# Patient Record
Sex: Female | Born: 1950 | Race: White | Hispanic: No | Marital: Single | Smoking: Former smoker
Health system: Southern US, Community
[De-identification: ages and names within clinical notes are randomized; demographics above are authoritative.]

## PROBLEM LIST (undated history)

## (undated) DIAGNOSIS — G5603 Carpal tunnel syndrome, bilateral upper limbs: Secondary | ICD-10-CM

## (undated) DIAGNOSIS — I10 Essential (primary) hypertension: Secondary | ICD-10-CM

## (undated) DIAGNOSIS — T8859XA Other complications of anesthesia, initial encounter: Secondary | ICD-10-CM

## (undated) DIAGNOSIS — F419 Anxiety disorder, unspecified: Secondary | ICD-10-CM

## (undated) DIAGNOSIS — Z87442 Personal history of urinary calculi: Secondary | ICD-10-CM

## (undated) DIAGNOSIS — N289 Disorder of kidney and ureter, unspecified: Secondary | ICD-10-CM

## (undated) DIAGNOSIS — Z8719 Personal history of other diseases of the digestive system: Secondary | ICD-10-CM

## (undated) DIAGNOSIS — J449 Chronic obstructive pulmonary disease, unspecified: Secondary | ICD-10-CM

## (undated) DIAGNOSIS — H269 Unspecified cataract: Secondary | ICD-10-CM

## (undated) DIAGNOSIS — T4145XA Adverse effect of unspecified anesthetic, initial encounter: Secondary | ICD-10-CM

## (undated) DIAGNOSIS — M1990 Unspecified osteoarthritis, unspecified site: Secondary | ICD-10-CM

## (undated) DIAGNOSIS — E119 Type 2 diabetes mellitus without complications: Secondary | ICD-10-CM

## (undated) DIAGNOSIS — D649 Anemia, unspecified: Secondary | ICD-10-CM

## (undated) DIAGNOSIS — H409 Unspecified glaucoma: Secondary | ICD-10-CM

## (undated) DIAGNOSIS — IMO0001 Reserved for inherently not codable concepts without codable children: Secondary | ICD-10-CM

## (undated) DIAGNOSIS — Z8489 Family history of other specified conditions: Secondary | ICD-10-CM

## (undated) HISTORY — PX: ABDOMINAL HYSTERECTOMY: SHX81

## (undated) HISTORY — PX: JOINT REPLACEMENT: SHX530

## (undated) HISTORY — PX: TOTAL KNEE ARTHROPLASTY: SHX125

## (undated) HISTORY — PX: OTHER SURGICAL HISTORY: SHX169

## (undated) HISTORY — PX: TOTAL HIP ARTHROPLASTY: SHX124

## (undated) HISTORY — PX: COLONOSCOPY: SHX174

## (undated) HISTORY — PX: CARPAL TUNNEL RELEASE: SHX101

---

## 1999-06-04 ENCOUNTER — Inpatient Hospital Stay (HOSPITAL_COMMUNITY): Admission: RE | Admit: 1999-06-04 | Discharge: 1999-06-07 | Payer: Self-pay | Admitting: Orthopaedic Surgery

## 1999-06-04 ENCOUNTER — Encounter (INDEPENDENT_AMBULATORY_CARE_PROVIDER_SITE_OTHER): Payer: Self-pay | Admitting: Specialist

## 2000-03-03 ENCOUNTER — Encounter: Admission: RE | Admit: 2000-03-03 | Discharge: 2000-03-03 | Payer: Self-pay | Admitting: Obstetrics and Gynecology

## 2000-03-03 ENCOUNTER — Encounter: Payer: Self-pay | Admitting: Obstetrics and Gynecology

## 2003-11-29 ENCOUNTER — Ambulatory Visit (HOSPITAL_COMMUNITY): Admission: RE | Admit: 2003-11-29 | Discharge: 2003-11-29 | Payer: Self-pay | Admitting: Family Medicine

## 2004-01-03 ENCOUNTER — Ambulatory Visit (HOSPITAL_COMMUNITY): Admission: RE | Admit: 2004-01-03 | Discharge: 2004-01-03 | Payer: Self-pay | Admitting: General Surgery

## 2004-12-28 ENCOUNTER — Inpatient Hospital Stay (HOSPITAL_COMMUNITY): Admission: RE | Admit: 2004-12-28 | Discharge: 2004-12-31 | Payer: Self-pay | Admitting: Orthopaedic Surgery

## 2007-05-25 ENCOUNTER — Ambulatory Visit (HOSPITAL_COMMUNITY): Admission: RE | Admit: 2007-05-25 | Discharge: 2007-05-25 | Payer: Self-pay | Admitting: Family Medicine

## 2010-06-04 ENCOUNTER — Ambulatory Visit (HOSPITAL_COMMUNITY)
Admission: RE | Admit: 2010-06-04 | Discharge: 2010-06-04 | Payer: Self-pay | Source: Home / Self Care | Attending: Family Medicine | Admitting: Family Medicine

## 2010-09-28 NOTE — Op Note (Signed)
Gwendolyn Snyder, Gwendolyn Snyder                ACCOUNT NO.:  [id number]   MEDICAL RECORD NO.:  [id number]          PATIENT TYPE:  INP   LOCATION:  2859                         FACILITY:  [facility]   PHYSICIAN:  Mark C. Ophelia Charter, M.D.    DATE OF BIRTH:  1950/11/24   DATE OF PROCEDURE:  12/28/2004  DATE OF DISCHARGE:                                 OPERATIVE REPORT   PREOPERATIVE DIAGNOSIS:  Right knee osteoarthritis.   POSTOPERATIVE DIAGNOSIS:  Right knee osteoarthritis.   PROCEDURE:  Right total knee arthroplasty.   SURGEON:  Mark C. Ophelia Charter, M.D.   ANESTHESIA:  Preoperative femoral nerve block.   TOURNIQUET TIME:  One hour 24 minutes.   ESTIMATED BLOOD LOSS:  Minimal.   COMPONENTS USED:  Summit DePuy rotating platform 2.5 cemented femur, #3  cemented tibia with 10 mm rotating platform 2.5 size, 38 mm All-Poly dome  three-peg patella.   DESCRIPTION OF PROCEDURE:  After induction of general anesthesia,  preoperative Ancef prophylaxis, standard prepping and draping, Betadine and  Vi-Drape application after sterile skin marker with the impervious  stockinette and Coban.  Incision was made midline, superficial retinaculum  was developed.  Median parapatellar incision was made splitting the quad  tendon between the medial one third and lateral two thirds.  Patella was  flipped over and cut from facet to facet removing 9.5 mm of bone, sizing for  38 and drilling three holes.  Intermedullary drill was run up the femur with  the rod, removing 14 mm of bone on the femur, intermedullary rod placed down  the tibia and AP cut was made on the tibia, 0 degree slope using the guide  removing about 10 mm of bone.  The 10 mm spacing block was inserted.  It was  slightly tight and it was selected, removing additional 2 mm and this gave  excellent fit with the spacer block inserted with good collateral ligament  balance.  Anterior and posterior cuts were made on the femur based on sizing  which was a 2.5 Chamfer  cuts, box cuts.  Sizing for the tibia was a 3.  Keel  hole was made.  Pulsatile lavage.  All trials were inserted.  There was  excellent fit with a 10 mm spacer.  After vacuum mixing, meticulous drying,  pulse lavage, tibia was cemented in first.  All excessive cement was removed  with Therapist, nutritional.  Femur was then cemented in.  Minimal amount of cement  in the box.  Posterior spurs had been removed prior to mixing of the cement  and all menisci had been resected.  A 10 mm insert was inserted followed by  patella held with the clamp.  When cement was hard, tourniquet was deflated,  meticulous hemostasis.  Tourniquet time was one hour and 24 minutes.  True  retinaculum closed with #1 Ti-Cron figure-of-eight closure, 2-0 Vicryl in  superficial  retinaculum, subcutaneous tissue, skin staple closure.  Xeroform, 4x4s,  Webril and Ace wrap, knee immobilizer.  Patient tolerated the procedure well  and was transferred to the recovery room in stable condition.  Instrument  count and needle count was correct.      Mark C. Ophelia Charter, M.D.  Electronically Signed     MCY/MEDQ  D:  12/28/2004  T:  12/28/2004  Job:  [id number]

## 2010-09-28 NOTE — Discharge Summary (Signed)
[name], Gwendolyn Snyder                ACCOUNT NO.:  [id number]   MEDICAL RECORD NO.:  [id number]          PATIENT TYPE:  INP   LOCATION:  5037                         FACILITY:  [facility]   PHYSICIAN:  Mark C. Ophelia Charter, M.D.    DATE OF BIRTH:  1950-09-23   DATE OF ADMISSION:  12/28/2004  DATE OF DISCHARGE:  12/31/2004                                 DISCHARGE SUMMARY   FINAL DIAGNOSIS:  Right knee osteoarthritis.   ADDITIONAL DIAGNOSES:  1.  Hypertension.  2.  Status post bilateral total hip arthroplasties.   PROCEDURE:  Right cemented total knee arthroplasty on December 28, 2004.   This 60 year old female has right knee osteoarthritis, bone on bone changes,  previous total hip arthroplasties past history, doing well.  Right knee has  only 10 to 90 degrees range of motion, stable varus valgus with bone on bone  changes to post effusion and failure to respond to anti-inflammatories,  repeat injections, visco-supplements as well as cortisone.   HOSPITAL COURSE:  Patient was admitted and routine labs showed normal  urinalysis.  Potassium of 3.3 on admission, glucose 102.  PT and PTT were  normal.  Hemoglobin was 14.6.  The patient underwent total knee arthroplasty  under general anesthesia with perioperative antibiotic prophylaxis,  postoperative Coumadin and DVT prophylaxis, PT, OT, total knee arthroplasty  protocol.  She ambulated 30 feet postoperative day #1.  On postoperative day  #2, IV PCA was stopped.  She made good progress and was discharged on December 31, 2004, using a CPM past 50 degrees.  Coumadin 4 mg a day, potassium  chloride 30 mg a day.  Home health PT and home health RN ordered.   CONDITION ON DISCHARGE:  Satisfactory.   FOLLOW UP:  Office follow-up in one week post discharge.      Mark C. Ophelia Charter, M.D.  Electronically Signed     MCY/MEDQ  D:  03/11/2005  T:  03/11/2005  Job:  [id number]

## 2010-09-28 NOTE — Op Note (Signed)
[place]  Patient:    Gwendolyn Snyder                          MRN: [id number] Proc. Date: 06/04/99 Adm. Date:  [id number] Attending:  Jacki Cones                           Operative Report  PREOPERATIVE DIAGNOSIS:  Right hip osteoarthritis.  POSTOPERATIVE DIAGNOSIS:  Right hip osteoarthritis.  PROCEDURE:  Right total hip arthroplasty.  SURGEON:  Mark C. Ophelia Charter, M.D.  ASSISTANT:  Olga Coaster. Chestine Spore, P.A.  ANESTHESIA:  GOT.  ESTIMATED BLOOD LOSS:  200 cc.  COMPONENTS USED:  Osteonics #8 femur, +5 neck, 50 mm acetabulum with a 10 mm liner.  DESCRIPTION OF PROCEDURE:  After the induction of general anesthesia, the patient was placed in the lateral position with an axillary roll.  The hip was prepped ith DuraPrep, and the usual preoperative Ancef was given prophylactically and _____  split sheets, impervious stockinette, Coban, and Betadine Vi-drape x 2.  The posterior approach was used identical to the opposite hip incision.  The gluteus maximus was split in line with its fibers.  There was a large nodule adjacent to the posterior hip capsule which looked like a rheumatoid nodule.  This was excised and removed and sent for pathologic examination.  There was capsular thickening. The piriformis was tagged for later repair, and the posterior capsule was excised. The capsule was hypertrophic.  There was only a small amount of joint fluid which was clear.  There were marginal osteophytes.  A large Steinmann pin was placed n the pelvis and a drill bit in the lateral aspect of the greater trochanter. The distance between the two measured at 6.0 cm, and then the hip was internally rotated.  The hip was dislocated.  The neck was cut.  The femur was prepared with proximal reaming, sequential broaching, and then distal reaming to 12.5.  The acetabulum was then prepared.  Sequential reaming from 44 up to 50.  The 50 gave good rim fit.   There were some large rim osteophytes that had to be removed. The cup was placed securely and impacted in place in 25-30 degrees of cup flexion, nd approximately 45 degrees of abduction.  There was slight overhang of the metal prosthesis over the posterior rim in the back 2.0 mm, and slight coverage on the anterior aspect, and it was flushed laterally.  _______ to the corner of the room. It was then aligned with the preoperative measurement from the ASIS to the sciatic notch and a lateral pelvis x-ray that was present hanging in the room.  Trials were placed with the broaches.  The patient was short on this side and needed to be lengthened 1/2 to 3/4 inch, and a +5 did this nicely.  A +10 lengthened it 2.0 cm from 6.0 to 8.0 cm, measuring from the styloid pin to the drill bit and was extremely tight.  There was no in between size between the #5 and the #10, so the +5 was selected.  After irrigation with saline solution the central hole plug in the acetabulum, permanent polyethylene liner, permanent femoral prosthesis was inserted and a +5 ball impacted.  The hip was reduced.  It had excellent stability, flexion to 90 degrees, adduction to 30 degrees, and internal rotation all the way to 90 degrees, with no evidence of instability.  There was no shuck with distal distraction.  The hip could reach full extension.  No trochanteric impingement.  The tensor fascia was closed with #2 Tycron, #0 Vicryl in the gluteus maximus fascia, #2-0 Vicryl in the subcutaneous tissue, and skin staple closure. Postoperatively a knee immobilizer was applied.  The patient was transferred to the recovery room in stable condition.  The instrument count and needle count were correct. DD:  06/04/99 TD:  06/05/99 Job: [id number]

## 2010-09-28 NOTE — Discharge Summary (Signed)
[place]  Patient:    Gwendolyn Snyder, Gwendolyn Snyder                         MRN: [id number] Adm. Date:  06/04/99 Disc. Date: 06/07/99 Attending:  Loraine Leriche C. Ophelia Charter, M.D. Dictator:   Bernita Raisin, P.A.                           Discharge Summary  ADMISSION DIAGNOSES: 1. Hypertension. 2. Panic disorder. 3. Osteoarthritis. 4. Kidney stones. 5. Obesity.  DISCHARGE DIAGNOSES: 1. Hypertension. 2. Panic disorder. 3. Osteoarthritis. 4. Kidney stones. 5. Obesity.  SERVICE:  [employer].  REFERRING PHYSICIANS:  None.  CONSULTATIONS:  None.  PROCEDURE:  On June 04, 1999, the patient had insertion of a right total hip  arthroplasty.  PERTINENT HISTORY AND PHYSICAL EXAMINATION:  The patients chief complaint is right hip pain; had had symptoms for two years, progressively worsening the last six months reducing ADLs and causing the patient much discomfort at night.  She has  failed conservative treatment.  PAST SURGICAL HISTORY:  Include a cyst removal, 1969 and 1971.  Kidney stone extraction in 1984, hysterectomy in 1993 and a left total hip arthroplasty in 1994.  ALLERGIES:  CODEINE.  SOCIAL HISTORY:  The patient smokes one-pack of cigarettes a day.  Physical examination:  Examination of the right hip and lower extremities, she as 1+ pulses in her feet, passive range of motion reproduces her pain symptoms in er hip.  She has negative straight leg raise.  Range of motion is from 5 degrees internal rotation, 35 degrees external rotation with extreme pain at the extremes. The remainder of her physical examination is within normal limits.  X-rays of the right hip show decreased joint space with marginal osteophytes and flattening of the femoral head.  ADMISSION MEDICATIONS: 1. Norvasc 5 mg daily. 2. Xanax 0.25 mg daily. 3. HCTZ 25 mg one-half daily. 4. Zoloft 100 mg daily. 5. Potassium, B12, and multivitamins, one p.o.  daily.  HOSPITAL COURSE:  On June 04, 1999, the patient had uncomplicated insertion f a right total hip arthroplasty by Dr. Annell Greening, assistant Inocente Salles, P.A. EBL 200 cc and the patient received Ancef 1 g IV intraoperatively.  No drains were inserted.  Postop day #1, the patient was doing well.  Sciatic nerve was intact.  The pain is being well-controlled.  O2 was discontinued and PT was started.  PT and OT saw he patient on this date and performed their evaluation.  On postop day #2 the patient continued to do well and no complaints.  No nausea, vomiting, or diarrhea.  She reports that therapy is going well.  Afebrile. Vital signs are stable with the incisions clean, dry and intact.  The patient is neurovascularly intact.  Labs:  PT 17.1, INR of 1.7.  White blood cell count 10.2, platelets 232, hemoglobin 11.2, hematocrit 33.  Postop day #3, the patient continued to do well.  States she is ready to go home. She has no complaints.  She has had bowel movement.  She has had no nausea, vomiting, or diarrhea.  She reports she has been ambulating with the use of a walker and performing steps with ease.  She is afebrile.  Vital signs are stable. Again, the incision looks clean, dry and intact.  No erythema or drainage.  She is neurovascularly intact.  Sciatic nerve intact.  Labs:  PT 17.5 and  INR of 1.8. CBC is normal.  DISPOSITION:  The patient was discharged home.  DISCHARGE MEDICATIONS:  Resume home meds with the addition of Tylox for pain.  DISCHARGE INSTRUCTIONS:  The patient was setup for home health PT.  No home Coumadin therapy.  She was instructed to use her shower chair, but not to shower until one week postop.  She was instructed to follow up in the office in one week, to change her dressing every two to three days or p.r.n. and we will see her in the office in one week.  FINAL DIAGNOSES: 1. Hypertension. 2. Osteoarthritis. 3. Kidney stones. 4. Panic  disorder. 5. Obesity. DD:  08/03/99 TD:  08/03/99 Job: [id number]

## 2010-09-28 NOTE — H&P (Signed)
NAME:  Gwendolyn Snyder, Gwendolyn Snyder                          ACCOUNT NO.:  [id number]   MEDICAL RECORD NO.:  [id number]                   PATIENT TYPE:   LOCATION:                                       FACILITY:   PHYSICIAN:  Dalia Heading, M.D.               DATE OF BIRTH:  April 03, 1951   DATE OF ADMISSION:  DATE OF DISCHARGE:                                HISTORY & PHYSICAL   CHIEF COMPLAINT:  Need for screening colonoscopy.   HISTORY OF PRESENT ILLNESS:  The patient is a 60 year old white female who  was referred for endoscopic evaluation. She needs colonoscopy for screening  purposes. No abdominal pain, weight loss, nausea, vomiting, diarrhea,  constipation, melena, or hematochezia has been noted. She has never had a  colonoscopy. There is no family history of colon carcinoma.   PAST MEDICAL HISTORY:  Includes hypertension.   PAST SURGICAL HISTORY:  1. Multiple hip replacements.  2. Carpal tunnel release.  3. Hysterectomy.   CURRENT MEDICATIONS:  1. Vytorin.  2. Norvasc.  3. Hydrochlorothiazide.   ALLERGIES:  CODEINE.   REVIEW OF SYSTEMS:  The patient does smoke a pack of cigarettes a day. She  denies any significant alcohol use. She denies any other cardiopulmonary  difficulties or bleeding disorders.   PHYSICAL EXAMINATION:  GENERAL:  The patient is a well-developed, well-  nourished, white female in no acute distress. She is afebrile. Vital signs  are stable.  LUNGS:  Clear to auscultation with equal breath sounds bilaterally.  HEART:  Reveals a regular rate and rhythm without S3, S4, or murmurs.  ABDOMEN:  Soft, nontender, nondistended. No hepatosplenomegaly, masses, or  hernias are noted.  RECTAL:  Deferred to the procedure.   IMPRESSION:  Need for screening colonoscopy.   PLAN:  The patient is scheduled for a colonoscopy on January 03, 2004. The  risks and benefits of the procedure including bleeding and perforation were  fully explained to the patient, who gave informed  consent.     ___________________________________________                                         Dalia Heading, M.D.   MAJ/MEDQ  D:  12/06/2003  T:  12/06/2003  Job:  [id number]

## 2011-10-21 ENCOUNTER — Other Ambulatory Visit (HOSPITAL_COMMUNITY): Payer: Self-pay | Admitting: Family Medicine

## 2011-10-21 DIAGNOSIS — Z139 Encounter for screening, unspecified: Secondary | ICD-10-CM

## 2011-10-24 ENCOUNTER — Ambulatory Visit (HOSPITAL_COMMUNITY)
Admission: RE | Admit: 2011-10-24 | Discharge: 2011-10-24 | Disposition: A | Discharge status: Home / Self Care | Payer: Medicare Other | Source: Ambulatory Visit | Attending: Family Medicine | Admitting: Family Medicine

## 2011-10-24 DIAGNOSIS — Z1231 Encounter for screening mammogram for malignant neoplasm of breast: Secondary | ICD-10-CM | POA: Insufficient documentation

## 2011-10-24 DIAGNOSIS — Z139 Encounter for screening, unspecified: Secondary | ICD-10-CM

## 2013-03-08 ENCOUNTER — Other Ambulatory Visit (HOSPITAL_COMMUNITY): Payer: Self-pay | Admitting: Family Medicine

## 2013-03-08 DIAGNOSIS — Z139 Encounter for screening, unspecified: Secondary | ICD-10-CM

## 2013-03-09 ENCOUNTER — Ambulatory Visit (HOSPITAL_COMMUNITY)
Admission: RE | Admit: 2013-03-09 | Discharge: 2013-03-09 | Disposition: A | Discharge status: Home / Self Care | Payer: Medicare Other | Source: Ambulatory Visit | Attending: Family Medicine | Admitting: Family Medicine

## 2013-03-09 DIAGNOSIS — Z1231 Encounter for screening mammogram for malignant neoplasm of breast: Secondary | ICD-10-CM | POA: Insufficient documentation

## 2013-03-09 DIAGNOSIS — Z139 Encounter for screening, unspecified: Secondary | ICD-10-CM

## 2014-09-19 ENCOUNTER — Other Ambulatory Visit (HOSPITAL_COMMUNITY): Payer: Self-pay | Admitting: Family Medicine

## 2014-09-19 DIAGNOSIS — Z1231 Encounter for screening mammogram for malignant neoplasm of breast: Secondary | ICD-10-CM

## 2014-10-17 ENCOUNTER — Ambulatory Visit (HOSPITAL_COMMUNITY)
Admission: RE | Admit: 2014-10-17 | Discharge: 2014-10-17 | Disposition: A | Discharge status: Home / Self Care | Payer: Medicare Other | Source: Ambulatory Visit | Attending: Family Medicine | Admitting: Family Medicine

## 2014-10-17 DIAGNOSIS — Z1231 Encounter for screening mammogram for malignant neoplasm of breast: Secondary | ICD-10-CM | POA: Diagnosis present

## 2015-05-30 DIAGNOSIS — H348122 Central retinal vein occlusion, left eye, stable: Secondary | ICD-10-CM | POA: Diagnosis not present

## 2015-05-30 DIAGNOSIS — H43813 Vitreous degeneration, bilateral: Secondary | ICD-10-CM | POA: Diagnosis not present

## 2015-05-30 DIAGNOSIS — E119 Type 2 diabetes mellitus without complications: Secondary | ICD-10-CM | POA: Diagnosis not present

## 2015-06-02 DIAGNOSIS — E782 Mixed hyperlipidemia: Secondary | ICD-10-CM | POA: Diagnosis not present

## 2015-06-02 DIAGNOSIS — Z6835 Body mass index (BMI) 35.0-35.9, adult: Secondary | ICD-10-CM | POA: Diagnosis not present

## 2015-06-02 DIAGNOSIS — E1129 Type 2 diabetes mellitus with other diabetic kidney complication: Secondary | ICD-10-CM | POA: Diagnosis not present

## 2015-06-02 DIAGNOSIS — Z1389 Encounter for screening for other disorder: Secondary | ICD-10-CM | POA: Diagnosis not present

## 2015-06-02 DIAGNOSIS — I10 Essential (primary) hypertension: Secondary | ICD-10-CM | POA: Diagnosis not present

## 2015-06-02 DIAGNOSIS — E119 Type 2 diabetes mellitus without complications: Secondary | ICD-10-CM | POA: Diagnosis not present

## 2015-06-02 DIAGNOSIS — J449 Chronic obstructive pulmonary disease, unspecified: Secondary | ICD-10-CM | POA: Diagnosis not present

## 2015-09-01 DIAGNOSIS — E6609 Other obesity due to excess calories: Secondary | ICD-10-CM | POA: Diagnosis not present

## 2015-09-01 DIAGNOSIS — Z6833 Body mass index (BMI) 33.0-33.9, adult: Secondary | ICD-10-CM | POA: Diagnosis not present

## 2015-09-01 DIAGNOSIS — I10 Essential (primary) hypertension: Secondary | ICD-10-CM | POA: Diagnosis not present

## 2015-09-01 DIAGNOSIS — E119 Type 2 diabetes mellitus without complications: Secondary | ICD-10-CM | POA: Diagnosis not present

## 2015-09-01 DIAGNOSIS — Z1389 Encounter for screening for other disorder: Secondary | ICD-10-CM | POA: Diagnosis not present

## 2015-09-13 ENCOUNTER — Other Ambulatory Visit: Payer: Self-pay | Admitting: Orthopaedic Surgery

## 2015-09-13 DIAGNOSIS — Z96642 Presence of left artificial hip joint: Secondary | ICD-10-CM | POA: Diagnosis not present

## 2015-09-13 DIAGNOSIS — T84031A Mechanical loosening of internal left hip prosthetic joint, initial encounter: Secondary | ICD-10-CM | POA: Diagnosis not present

## 2015-09-30 NOTE — Pre-Procedure Instructions (Signed)
Gwendolyn CancelWanda S Snyder  09/30/2015     Your procedure is scheduled on June 2.  Report to [facility] Admitting at 5:30 A.M.  Call this number if you have problems the morning of surgery:  [phone]   Remember:  Do not eat food or drink liquids after midnight.  Take these medicines the morning of surgery with A SIP OF WATER Amlodipine, Tylenol (if needed),    STOP Vitamin D, Niacin May 26   STOP/ Do not take Aspirin, Aleve, Naproxen, Advil, Ibuprofen, Motrin, Vitamins, Herbs, or Supplements starting May 26   Do not wear jewelry, make-up or nail polish.  Do not wear lotions, powders, or perfumes.  You may not wear deodorant.  Do not shave 48 hours prior to surgery.  Men may shave face and neck.  Do not bring valuables to the hospital.  [facility] any belongings or valuables.  Contacts, dentures or bridgework may not be worn into surgery.  Leave your suitcase in the car.  After surgery it may be brought to your room.  For patients admitted to the hospital, discharge time will be determined by your treatment team.  Patients discharged the day of surgery will not be allowed to drive home.   [place] - Preparing for Surgery  Before surgery, you can play an important role.  Because skin is not sterile, your skin needs to be as free of germs as possible.  You can reduce the number of germs on you skin by washing with CHG (chlorahexidine gluconate) soap before surgery.  CHG is an antiseptic cleaner which kills germs and bonds with the skin to continue killing germs even after washing.  Please DO NOT use if you have an allergy to CHG or antibacterial soaps.  If your skin becomes reddened/irritated stop using the CHG and inform your nurse when you arrive at Short Stay.  Do not shave (including legs and underarms) for at least 48 hours prior to the first CHG shower.  You may shave your face.  Please follow these instructions carefully:   1.  Shower with  CHG Soap the night before surgery and the morning of Surgery.  2.  If you choose to wash your hair, wash your hair first as usual with your normal shampoo.  3.  After you shampoo, rinse your hair and body thoroughly to remove the shampoo.  4.  Use CHG as you would any other liquid soap.  You can apply CHG directly to the skin and wash gently with scrungie or a clean washcloth.  5.  Apply the CHG Soap to your body ONLY FROM THE NECK DOWN.  Do not use on open wounds or open sores.  Avoid contact with your eyes, ears, mouth and genitals (private parts).  Wash genitals (private parts) with your normal soap.  6.  Wash thoroughly, paying special attention to the area where your surgery will be performed.  7.  Thoroughly rinse your body with warm water from the neck down.  8.  DO NOT shower/wash with your normal soap after using and rinsing off the CHG Soap.  9.  Pat yourself dry with a clean towel.            10.  Wear clean pajamas.            11.  Place clean sheets on your bed the night of your first shower and do not sleep with pets.  Day of Surgery  Do not apply any lotions the morning of surgery.  Please wear clean clothes to the hospital/surgery center.

## 2015-10-02 ENCOUNTER — Encounter (HOSPITAL_COMMUNITY)
Admission: RE | Admit: 2015-10-02 | Discharge: 2015-10-02 | Disposition: A | Discharge status: Home / Self Care | Payer: PPO | Source: Ambulatory Visit | Attending: Orthopaedic Surgery | Admitting: Orthopaedic Surgery

## 2015-10-02 ENCOUNTER — Encounter (HOSPITAL_COMMUNITY): Payer: Self-pay

## 2015-10-02 ENCOUNTER — Ambulatory Visit (HOSPITAL_COMMUNITY)
Admission: RE | Admit: 2015-10-02 | Discharge: 2015-10-02 | Disposition: A | Discharge status: Home / Self Care | Payer: PPO | Source: Ambulatory Visit | Attending: Orthopaedic Surgery | Admitting: Orthopaedic Surgery

## 2015-10-02 DIAGNOSIS — R0602 Shortness of breath: Secondary | ICD-10-CM | POA: Diagnosis not present

## 2015-10-02 DIAGNOSIS — J449 Chronic obstructive pulmonary disease, unspecified: Secondary | ICD-10-CM | POA: Diagnosis not present

## 2015-10-02 DIAGNOSIS — E119 Type 2 diabetes mellitus without complications: Secondary | ICD-10-CM | POA: Diagnosis not present

## 2015-10-02 DIAGNOSIS — Z96643 Presence of artificial hip joint, bilateral: Secondary | ICD-10-CM | POA: Insufficient documentation

## 2015-10-02 DIAGNOSIS — M169 Osteoarthritis of hip, unspecified: Secondary | ICD-10-CM

## 2015-10-02 DIAGNOSIS — Z96651 Presence of right artificial knee joint: Secondary | ICD-10-CM | POA: Diagnosis not present

## 2015-10-02 DIAGNOSIS — I10 Essential (primary) hypertension: Secondary | ICD-10-CM | POA: Insufficient documentation

## 2015-10-02 DIAGNOSIS — K449 Diaphragmatic hernia without obstruction or gangrene: Secondary | ICD-10-CM | POA: Diagnosis not present

## 2015-10-02 DIAGNOSIS — Z0183 Encounter for blood typing: Secondary | ICD-10-CM | POA: Insufficient documentation

## 2015-10-02 DIAGNOSIS — H409 Unspecified glaucoma: Secondary | ICD-10-CM | POA: Insufficient documentation

## 2015-10-02 DIAGNOSIS — Z01812 Encounter for preprocedural laboratory examination: Secondary | ICD-10-CM | POA: Diagnosis not present

## 2015-10-02 DIAGNOSIS — Z79899 Other long term (current) drug therapy: Secondary | ICD-10-CM | POA: Insufficient documentation

## 2015-10-02 DIAGNOSIS — Z01818 Encounter for other preprocedural examination: Secondary | ICD-10-CM | POA: Diagnosis not present

## 2015-10-02 DIAGNOSIS — Z87891 Personal history of nicotine dependence: Secondary | ICD-10-CM | POA: Diagnosis not present

## 2015-10-02 HISTORY — DX: Personal history of other diseases of the digestive system: Z87.19

## 2015-10-02 HISTORY — DX: Reserved for inherently not codable concepts without codable children: IMO0001

## 2015-10-02 HISTORY — DX: Type 2 diabetes mellitus without complications: E11.9

## 2015-10-02 HISTORY — DX: Adverse effect of unspecified anesthetic, initial encounter: T41.45XA

## 2015-10-02 HISTORY — DX: Unspecified cataract: H26.9

## 2015-10-02 HISTORY — DX: Other complications of anesthesia, initial encounter: T88.59XA

## 2015-10-02 HISTORY — DX: Anemia, unspecified: D64.9

## 2015-10-02 HISTORY — DX: Anxiety disorder, unspecified: F41.9

## 2015-10-02 HISTORY — DX: Essential (primary) hypertension: I10

## 2015-10-02 HISTORY — DX: Unspecified osteoarthritis, unspecified site: M19.90

## 2015-10-02 HISTORY — DX: Chronic obstructive pulmonary disease, unspecified: J44.9

## 2015-10-02 HISTORY — DX: Unspecified glaucoma: H40.9

## 2015-10-02 LAB — CBC WITH DIFFERENTIAL/PLATELET
Basophils Absolute: 0 10*3/uL (ref 0.0–0.1)
Basophils Relative: 0 %
Eosinophils Absolute: 0.4 10*3/uL (ref 0.0–0.7)
Eosinophils Relative: 4 %
HCT: 39.5 % (ref 36.0–46.0)
Hemoglobin: 11.7 g/dL — ABNORMAL LOW (ref 12.0–15.0)
Lymphocytes Relative: 19 %
Lymphs Abs: 2.1 10*3/uL (ref 0.7–4.0)
MCH: 23.2 pg — ABNORMAL LOW (ref 26.0–34.0)
MCHC: 29.6 g/dL — ABNORMAL LOW (ref 30.0–36.0)
MCV: 78.4 fL (ref 78.0–100.0)
Monocytes Absolute: 0.8 10*3/uL (ref 0.1–1.0)
Monocytes Relative: 7 %
Neutro Abs: 8 10*3/uL — ABNORMAL HIGH (ref 1.7–7.7)
Neutrophils Relative %: 70 %
Platelets: 325 10*3/uL (ref 150–400)
RBC: 5.04 MIL/uL (ref 3.87–5.11)
RDW: 14.7 % (ref 11.5–15.5)
WBC: 11.3 10*3/uL — ABNORMAL HIGH (ref 4.0–10.5)

## 2015-10-02 LAB — ABO/RH: ABO/RH(D): O POS

## 2015-10-02 LAB — SEDIMENTATION RATE: Sed Rate: 35 mm/hr — ABNORMAL HIGH (ref 0–22)

## 2015-10-02 LAB — COMPREHENSIVE METABOLIC PANEL
ALT: 13 U/L — ABNORMAL LOW (ref 14–54)
AST: 19 U/L (ref 15–41)
Albumin: 3.9 g/dL (ref 3.5–5.0)
Alkaline Phosphatase: 71 U/L (ref 38–126)
Anion gap: 13 (ref 5–15)
BUN: 17 mg/dL (ref 6–20)
CO2: 22 mmol/L (ref 22–32)
Calcium: 9.8 mg/dL (ref 8.9–10.3)
Chloride: 107 mmol/L (ref 101–111)
Creatinine, Ser: 0.95 mg/dL (ref 0.44–1.00)
GFR calc Af Amer: >60 mL/min (ref >60)
GFR calc non Af Amer: >60 mL/min (ref >60)
Glucose, Bld: 111 mg/dL — ABNORMAL HIGH (ref 65–99)
Potassium: 2.5 mmol/L — CL (ref 3.5–5.1)
Sodium: 142 mmol/L (ref 135–145)
Total Bilirubin: 0.5 mg/dL (ref 0.3–1.2)
Total Protein: 7.6 g/dL (ref 6.5–8.1)

## 2015-10-02 LAB — TYPE AND SCREEN
ABO/RH(D): O POS
Antibody Screen: NEGATIVE

## 2015-10-02 LAB — URINALYSIS, ROUTINE W REFLEX MICROSCOPIC
Bilirubin Urine: NEGATIVE
Glucose, UA: NEGATIVE mg/dL
Ketones, ur: NEGATIVE mg/dL
Nitrite: NEGATIVE
Protein, ur: NEGATIVE mg/dL
Specific Gravity, Urine: 1.018 (ref 1.005–1.030)
pH: 5.5 (ref 5.0–8.0)

## 2015-10-02 LAB — URINE MICROSCOPIC-ADD ON

## 2015-10-02 LAB — GLUCOSE, CAPILLARY: Glucose-Capillary: 138 mg/dL — ABNORMAL HIGH (ref 65–99)

## 2015-10-02 LAB — SURGICAL PCR SCREEN
MRSA, PCR: NEGATIVE
Staphylococcus aureus: NEGATIVE

## 2015-10-02 LAB — PROTIME-INR
INR: 1.01 (ref 0.00–1.49)
Prothrombin Time: 13.5 seconds (ref 11.6–15.2)

## 2015-10-02 LAB — C-REACTIVE PROTEIN: CRP: 3.6 mg/dL — ABNORMAL HIGH (ref <1.0)

## 2015-10-02 NOTE — Progress Notes (Signed)
Panic Potassium level called to Consuello Bossierheryl Bennett @ Dr. Marlene BastYates's office.

## 2015-10-02 NOTE — Progress Notes (Signed)
Pt. Denies any Cardiac problems,no  Chest pain,or discomfort, never seen cardiologist or had any type of cardiac testing.   Pt. Has A1C in PCP office in April 6.2 and is not on any diabetic medications.

## 2015-10-02 NOTE — Progress Notes (Signed)
Dr.Fusco's office does not have any EKG's to use for comparison,office will send copy of A1C lab.

## 2015-10-03 ENCOUNTER — Encounter (HOSPITAL_COMMUNITY): Payer: Self-pay | Admitting: Vascular Surgery

## 2015-10-03 NOTE — Progress Notes (Addendum)
Anesthesia Chart Review: Patient is a 65 year old female scheduled for left total hip revision, poly and ball exchange on 10/13/15 by Dr. Ophelia CharterYates.  History includes former smoker (quit 05/13/04), COPD, anxiety, HTN, DM2 (diet controlled), exertional dyspnea, hiatal hernia, glaucoma, anemia, hysterectomy, right TKA '06, bilateral THA (right '01). She felt like she could not breathe after one surgery. BMI is 32 consistent with obesity. PCP is listed as Dr. Elfredia NevinsLawrence Fusco.  Meds include amlodipine, 65 Fe, Niacin, Xalatan ophthalmic, lisinopril, KCL.  PAT Vitals: BP 188/85, HR 85, RR 18, T 36.8C, O2 sat 100%. CBG 138.   10/02/15 EKG: NSR, inferior infarct (age undetermined), possible anterior infarct (age undetermined), ST/T wave abnormality, consider lateral ischemia. Since previous tracing on 05/31/99, ST depression in high lateral leads consistent with ischemia. PAT RN wrote that her PCP did not have a more recent comparison EKG. Patient denied prior stress, echo, or cath.  10/02/15 CXR: IMPRESSION: Mild hyperinflation consistent with COPD. There is no pneumonia nor CHF. There is a large hiatal hernia-partially intra thoracic Stomach.  Preoperative labs noted. K+ 2.5 ([facility] at Dr. Ophelia CharterYates' office on 10/02/15; Dr. Sherwood GamblerFusco is increasing her KCL dose). Cr 0.95. WBC 11.3, H/H 11.7/39.5. Glucose 111. PT/INR WNL. T&S done. A1c requested from his PCP office, but is still pending. If K+ note rechecked before surgery then she will need an ISTAT on arrival.   Patient with lateral T wave changes concerning for ischemia. BP not well controlled at PAT. Multiple CAD risk factors. Discussed with anesthesiologist Dr. Desmond Lopeurk. Recommend preoperative cardiology evaluation.  Cheryl at Dr. Ophelia CharterYates' office notified.   Velna Ochsllison Amal Saiki, PA-C [facility]An[facility]hesiology Phone [phone] 10/03/2015 5:21 PM

## 2015-10-10 DIAGNOSIS — E6609 Other obesity due to excess calories: Secondary | ICD-10-CM | POA: Diagnosis not present

## 2015-10-10 DIAGNOSIS — Z1389 Encounter for screening for other disorder: Secondary | ICD-10-CM | POA: Diagnosis not present

## 2015-10-10 DIAGNOSIS — Z6833 Body mass index (BMI) 33.0-33.9, adult: Secondary | ICD-10-CM | POA: Diagnosis not present

## 2015-10-10 DIAGNOSIS — E876 Hypokalemia: Secondary | ICD-10-CM | POA: Diagnosis not present

## 2015-10-10 DIAGNOSIS — N189 Chronic kidney disease, unspecified: Secondary | ICD-10-CM | POA: Diagnosis not present

## 2015-10-13 ENCOUNTER — Inpatient Hospital Stay (HOSPITAL_COMMUNITY): Admission: RE | Admit: 2015-10-13 | Payer: PPO | Source: Ambulatory Visit | Admitting: Orthopaedic Surgery

## 2015-10-13 ENCOUNTER — Encounter (HOSPITAL_COMMUNITY): Admission: RE | Payer: Self-pay | Source: Ambulatory Visit

## 2015-10-13 SURGERY — TOTAL HIP REVISION
Anesthesia: General | Laterality: Left

## 2015-10-14 DIAGNOSIS — H2513 Age-related nuclear cataract, bilateral: Secondary | ICD-10-CM | POA: Diagnosis not present

## 2015-10-14 DIAGNOSIS — H401111 Primary open-angle glaucoma, right eye, mild stage: Secondary | ICD-10-CM | POA: Diagnosis not present

## 2015-10-14 NOTE — Progress Notes (Signed)
Patient ID: Gwendolyn Snyder, female   DOB: 02-12-1951, 65 y.o.   MRN: [id number]     Cardiology Office Note   Date:  10/17/2015   ID:  Gwendolyn Snyder, DOB [birth year], MRN [id number]  PCP:  Glo Herring., MD  Cardiologist:   Jenkins Rouge, MD   No chief complaint on file.     History of Present Illness: Gwendolyn Snyder is a 65 y.o. female who presents for surgical clearance Needs left total hip with Dr Lorin Mercy Rx for HTN and previous smoker Preop cxr reviewed and shows hyperinflation consistent with COPD, CE and large hiatal hernia.  Labs 10/02/15 with elevated ESR 35 She has had multiple orthopedic surgeries including both hips and more recently right knee in 2006. No anestetic/ operative issues for these. Reviewed her ECG from  5/22 and nonspecific ST changes with ? IMI/AMI  She had low K at that time and it has since been supplemented.  She has some issues walking due to gout in left foot and ortho issues. Previous smoker with mild COPD Quit 11 years ago. No bleeding issues No clinical vascular dx no chest pain, palpitations or syncope     Past Medical History  Diagnosis Date  . COPD (chronic obstructive pulmonary disease) ([place])   . Anxiety   . Complication of anesthesia     once after surgery felt like she could not breathe  . Hypertension   . Shortness of breath dyspnea     with exertion  . Diabetes mellitus without complication ([facility])     diet controlled , A1C done in April 6.2  . Kidney stones   . History of hiatal hernia   . Arthritis   . Anemia   . Glaucoma   . Cataracts, bilateral     Past Surgical History  Procedure Laterality Date  . Carpal tunnel release Right   . Total knee arthroplasty Right   . Total hip arthroplasty Bilateral   . Abdominal hysterectomy    . Removal kidney stone       Current Outpatient Prescriptions  Medication Sig Dispense Refill  . acetaminophen (TYLENOL) 650 MG CR tablet Take 1,300 mg by mouth 2 (two) times daily.    Marland Kitchen amLODipine  (NORVASC) 5 MG tablet Take 1 tablet by mouth daily.    . cholecalciferol (VITAMIN D) 1000 units tablet Take 1,000 Units by mouth daily.    . ferrous sulfate 325 (65 FE) MG tablet Take 325 mg by mouth 3 (three) times a week. Mon Wed Fri    . Inositol Niacinate (NIACIN FLUSH FREE) 500 MG CAPS Take 1 capsule by mouth 2 (two) times daily.    Marland Kitchen latanoprost (XALATAN) 0.005 % ophthalmic solution Place 1 drop into both eyes at bedtime.    Marland Kitchen lisinopril (PRINIVIL,ZESTRIL) 20 MG tablet Take 1 tablet by mouth 2 (two) times daily.    . potassium chloride (K-DUR) 10 MEQ tablet Take 1 tablet by mouth daily.     No current facility-administered medications for this visit.    Allergies:   Aspirin and Codeine    Social History:  The patient  reports that she quit smoking about 11 years ago. Her smoking use included Cigarettes. She has a 30 pack-year smoking history. She has never used smokeless tobacco. She reports that she does not drink alcohol or use illicit drugs.   Family History:  The patient's family history is not on file.    ROS:  Please see the history of present illness.  Otherwise, review of systems are positive for none.   All other systems are reviewed and negative.    PHYSICAL EXAM: VS:  BP 152/82 mmHg  Pulse 87  Ht _0  (1.727 m)  Wt 83.008 kg (183 lb)  BMI 27.83 kg/m2  SpO2 97% , BMI Body mass index is 27.83 kg/(m^2). Affect appropriate Healthy:  appears stated age 65: normal Neck supple with no adenopathy JVP normal no bruits no thyromegaly Lungs clear with no wheezing and good diaphragmatic motion Heart:  S1/S2 no murmur, no rub, gallop or click PMI normal Abdomen: benighn, BS positve, no tenderness, no AAA no bruit.  No HSM or HJR Distal pulses intact with no bruits No edema Neuro non-focal Skin warm and dry No muscular weakness    EKG:  10/02/15 SR rate 75 nonspecific ST changes cannot r/o inferior / anterior MI's    Recent Labs: 10/02/2015: ALT 13*; BUN 17;  Creatinine, Ser 0.95; Hemoglobin 11.7*; Platelets 325; Potassium 2.5*; Sodium 142    Lipid Panel No results found for: CHOL, TRIG, HDL, CHOLHDL, VLDL, LDLCALC, LDLDIRECT    Wt Readings from Last 3 Encounters:  10/17/15 83.008 kg (183 lb)  10/02/15 82.781 kg (182 lb 8 oz)      Other studies Reviewed: Additional studies/ records that were reviewed today include: CT ECG and ortho notes in Epic.    ASSESSMENT AND PLAN:  1.  Preop:  Abnormal ECG will order echo to make sure LV function normal no need for stress testing as no chest pain 2. COPD: mild no active wheezing should not be at post op risk for prolonged intubation  3. K:  On ACE and supplement not clear why it would be low f/u primary  4. HTN:  Continue current meds well controlled    Current medicines are reviewed at length with the patient today.  The patient does not have concerns regarding medicines.  The following changes have been made:  no change  Labs/ tests ordered today include: Echo   No orders of the defined types were placed in this encounter.     Disposition:   FU with PRN depending on echo      Signed, Jenkins Rouge, MD  10/17/2015 9:11 AM    [address], [place], [place]  [address] Phone: [phone]; Fax: [phone]

## 2015-10-16 DIAGNOSIS — E876 Hypokalemia: Secondary | ICD-10-CM | POA: Diagnosis not present

## 2015-10-17 ENCOUNTER — Encounter: Payer: Self-pay | Admitting: Cardiovascular Disease

## 2015-10-17 ENCOUNTER — Ambulatory Visit (INDEPENDENT_AMBULATORY_CARE_PROVIDER_SITE_OTHER): Payer: PPO | Admitting: Cardiovascular Disease

## 2015-10-17 VITALS — BP 152/82 | HR 87 | Ht 68.0 in | Wt 183.0 lb

## 2015-10-17 DIAGNOSIS — R9431 Abnormal electrocardiogram [ECG] [EKG]: Secondary | ICD-10-CM | POA: Diagnosis not present

## 2015-10-17 NOTE — Patient Instructions (Signed)
Your physician recommends that you schedule a follow-up appointment As needed  Your physician recommends that you continue on your current medications as directed. Please refer to the Current Medication list given to you today.  Your physician has requested that you have an echocardiogram. Echocardiography is a painless test that uses sound waves to create images of your heart. It provides your doctor with information about the size and shape of your heart and how well your heart's chambers and valves are working. This procedure takes approximately one hour. There are no restrictions for this procedure.   If you need a refill on your cardiac medications before your next appointment, please call your pharmacy.  Thank you for choosing [facility]!

## 2015-10-18 ENCOUNTER — Ambulatory Visit (HOSPITAL_COMMUNITY)
Admission: RE | Admit: 2015-10-18 | Discharge: 2015-10-18 | Disposition: A | Discharge status: Home / Self Care | Payer: PPO | Source: Ambulatory Visit | Attending: Cardiovascular Disease | Admitting: Cardiovascular Disease

## 2015-10-18 DIAGNOSIS — I10 Essential (primary) hypertension: Secondary | ICD-10-CM | POA: Diagnosis not present

## 2015-10-18 DIAGNOSIS — R9431 Abnormal electrocardiogram [ECG] [EKG]: Secondary | ICD-10-CM

## 2015-10-18 LAB — ECHOCARDIOGRAM COMPLETE
E decel time: 224 msec
E/e' ratio: 13.86
FS: 37 % (ref 28–44)
IVS/LV PW RATIO, ED: 0.98
LA ID, A-P, ES: 28 mm
LA diam end sys: 28 mm
LA diam index: 1.42 cm/m2
LA vol A4C: 50.8 ml
LV E/e' medial: 13.86
LV E/e'average: 13.86
LV PW d: 11.5 mm — AB (ref 0.6–1.1)
LV dias vol index: 26 mL/m2
LV dias vol: 51 mL (ref 46–106)
LV e' LATERAL: 6.42 cm/s
LV sys vol index: 10 mL/m2
LV sys vol: 21 mL (ref 14–42)
LVOT area: 2.54 cm2
LVOT diameter: 18 mm
MV Dec: 224
MV Peak grad: 3 mmHg
MV pk A vel: 90.9 m/s
MV pk E vel: 89 m/s
Simpson's disk: 60
Stroke v: 31 ml
TAPSE: 26.5 mm
TDI e' lateral: 6.42
TDI e' medial: 5.44

## 2015-10-30 ENCOUNTER — Other Ambulatory Visit: Payer: Self-pay | Admitting: Orthopedic Surgery

## 2015-11-03 DIAGNOSIS — H2513 Age-related nuclear cataract, bilateral: Secondary | ICD-10-CM | POA: Diagnosis not present

## 2015-11-03 DIAGNOSIS — H401111 Primary open-angle glaucoma, right eye, mild stage: Secondary | ICD-10-CM | POA: Diagnosis not present

## 2015-11-07 NOTE — Pre-Procedure Instructions (Signed)
    Gwendolyn CancelWanda S Snyder  11/07/2015    Your procedure is scheduled on Wednesday, July 12.  [facility] Ad[facility] at 10:30 A.M.                 Your surgery or procedure is scheduled for 12:30 PM   Call this number if you have problems the morning of [phone]                 For any other questions, please call [phone], Monday - Friday 8 AM - 4 PM.   Remember:  Do not eat food or drink liquids after midnight Tuesday, July 11.  Take these medicines the morning of surgery with A SIP OF WATER :amLODipine (NORVASC).                May take Acetaminophen (Tylenol ) if needed.                 Wednesday, July 5 : Stop taking Aspirin,and Herbal medications.  Do not take any NSAIDs ie: Ibuprofen,  Advil,Naproxen or any medication containing Aspirin.   Do not wear jewelry, make-up or nail polish.  Do not wear lotions, powders, or perfumes.  You may wear deoderant.  Do not shave 48 hours prior to surgery.  Men may shave face and neck.  Do not bring valuables to the hospital.  [facility] is not responsible for any belongings or valuables.  Contacts, dentures or bridgework may not be worn into surgery.  Leave your suitcase in the car.  After surgery it may be brought to your room.  For patients admitted to the hospital, discharge time will be determined by your treatment team.  Please read over the following fact sheets that you were given. - Preparing For Surgery and Patient Instructions for Mupirocin Application

## 2015-11-09 ENCOUNTER — Encounter (HOSPITAL_COMMUNITY): Payer: Self-pay

## 2015-11-09 ENCOUNTER — Encounter (HOSPITAL_COMMUNITY)
Admission: RE | Admit: 2015-11-09 | Discharge: 2015-11-09 | Disposition: A | Discharge status: Home / Self Care | Payer: PPO | Source: Ambulatory Visit | Attending: Orthopaedic Surgery | Admitting: Orthopaedic Surgery

## 2015-11-09 DIAGNOSIS — Z01812 Encounter for preprocedural laboratory examination: Secondary | ICD-10-CM | POA: Diagnosis not present

## 2015-11-09 DIAGNOSIS — Z96642 Presence of left artificial hip joint: Secondary | ICD-10-CM | POA: Diagnosis not present

## 2015-11-09 DIAGNOSIS — T8484XA Pain due to internal orthopedic prosthetic devices, implants and grafts, initial encounter: Secondary | ICD-10-CM | POA: Diagnosis not present

## 2015-11-09 DIAGNOSIS — Z0183 Encounter for blood typing: Secondary | ICD-10-CM | POA: Insufficient documentation

## 2015-11-09 DIAGNOSIS — Y838 Other surgical procedures as the cause of abnormal reaction of the patient, or of later complication, without mention of misadventure at the time of the procedure: Secondary | ICD-10-CM | POA: Diagnosis not present

## 2015-11-09 HISTORY — DX: Family history of other specified conditions: Z84.89

## 2015-11-09 LAB — CBC
HCT: 38.6 % (ref 36.0–46.0)
Hemoglobin: 11.2 g/dL — ABNORMAL LOW (ref 12.0–15.0)
MCH: 23.5 pg — ABNORMAL LOW (ref 26.0–34.0)
MCHC: 29 g/dL — ABNORMAL LOW (ref 30.0–36.0)
MCV: 81.1 fL (ref 78.0–100.0)
Platelets: 288 10*3/uL (ref 150–400)
RBC: 4.76 MIL/uL (ref 3.87–5.11)
RDW: 16.9 % — ABNORMAL HIGH (ref 11.5–15.5)
WBC: 8.6 10*3/uL (ref 4.0–10.5)

## 2015-11-09 LAB — TYPE AND SCREEN
ABO/RH(D): O POS
Antibody Screen: NEGATIVE

## 2015-11-09 LAB — SURGICAL PCR SCREEN
MRSA, PCR: NEGATIVE
Staphylococcus aureus: NEGATIVE

## 2015-11-09 LAB — BASIC METABOLIC PANEL
Anion gap: 7 (ref 5–15)
BUN: 57 mg/dL — ABNORMAL HIGH (ref 6–20)
CO2: 13 mmol/L — ABNORMAL LOW (ref 22–32)
Calcium: 9.9 mg/dL (ref 8.9–10.3)
Chloride: 119 mmol/L — ABNORMAL HIGH (ref 101–111)
Creatinine, Ser: 1.38 mg/dL — ABNORMAL HIGH (ref 0.44–1.00)
GFR calc Af Amer: 46 mL/min — ABNORMAL LOW (ref >60)
GFR calc non Af Amer: 39 mL/min — ABNORMAL LOW (ref >60)
Glucose, Bld: 105 mg/dL — ABNORMAL HIGH (ref 65–99)
Potassium: 5.3 mmol/L — ABNORMAL HIGH (ref 3.5–5.1)
Sodium: 139 mmol/L (ref 135–145)

## 2015-11-09 LAB — GLUCOSE, CAPILLARY: Glucose-Capillary: 102 mg/dL — ABNORMAL HIGH (ref 65–99)

## 2015-11-09 NOTE — Progress Notes (Signed)
PCP is Dr Gerrie NordmannFussco. Surgery was cancelled in May due to a panic Potassium. Patient is on Potassium 20 daily.  Patient feels that Potassium is high now, states she spoke with a pharmarmicist and she has many symptoms, tried all the time , diarrhea.

## 2015-11-09 NOTE — Progress Notes (Signed)
   How to Manage Your Diabetes Before and After Surgery  Why is it important to control my blood sugar before and after surgery? . Improving blood sugar levels before and after surgery helps healing and can limit problems. . A way of improving blood sugar control is eating a healthy diet by: o  Eating less sugar and carbohydrates o  Increasing activity/exercise o  Talking with your doctor about reaching your blood sugar goals . High blood sugars (greater than 180 mg/dL) can raise your risk of infections and slow your recovery, so you will need to focus on controlling your diabetes during the weeks before surgery. . Make sure that the doctor who takes care of your diabetes knows about your planned surgery including the date and location.  How do I manage my blood sugar before surgery? . Check your blood sugar at least 4 times a day, starting 2 days before surgery, to make sure that the level is not too high or low. o Check your blood sugar the morning of your surgery when you wake up and every 2 hours until you get to the Short Stay unit. . If your blood sugar is less than 70 mg/dL, you will need to treat for low blood sugar: o Do not take insulin. o Treat a low blood sugar (less than 70 mg/dL) with  cup of clear juice (cranberry or apple), 4 glucose tablets, OR glucose gel. o Recheck blood sugar in 15 minutes after treatment (to make sure it is greater than 70 mg/dL). If your blood sugar is not greater than 70 mg/dL on recheck, call [phone] for further instructions. . Report your blood sugar to the short stay nurse when you get to Short Stay.  . If you are admitted to the hospital after surgery: o Your blood sugar will be checked by the staff and you will probably be given insulin after surgery (instead of oral diabetes medicines) to make sure you have good blood sugar levels. o The goal for blood sugar control after surgery is 80-180 mg/dL.           

## 2015-11-10 LAB — HEMOGLOBIN A1C
Hgb A1c MFr Bld: 7.3 % — ABNORMAL HIGH (ref 4.8–5.6)
Mean Plasma Glucose: 163 mg/dL

## 2015-11-21 MED ORDER — CEFAZOLIN SODIUM-DEXTROSE 2-4 GM/100ML-% IV SOLN
2.0000 g | INTRAVENOUS | Status: AC
Start: 2015-11-22 — End: 2015-11-22
  Administered 2015-11-22: 2 g via INTRAVENOUS
  Filled 2015-11-21: qty 100

## 2015-11-22 ENCOUNTER — Inpatient Hospital Stay (HOSPITAL_COMMUNITY)
Admission: RE | Admit: 2015-11-22 | Discharge: 2015-11-24 | DRG: 468 | Disposition: A | Discharge status: Home / Self Care | Payer: PPO | Source: Ambulatory Visit | Attending: Orthopaedic Surgery | Admitting: Orthopaedic Surgery

## 2015-11-22 ENCOUNTER — Inpatient Hospital Stay (HOSPITAL_COMMUNITY): Payer: PPO | Admitting: Certified Registered Nurse Anesthetist

## 2015-11-22 ENCOUNTER — Encounter (HOSPITAL_COMMUNITY): Payer: Self-pay | Admitting: *Deleted

## 2015-11-22 ENCOUNTER — Encounter (HOSPITAL_COMMUNITY)
Admission: RE | Disposition: A | Discharge status: Home / Self Care | Payer: Self-pay | Source: Ambulatory Visit | Attending: Orthopaedic Surgery

## 2015-11-22 ENCOUNTER — Inpatient Hospital Stay (HOSPITAL_COMMUNITY): Payer: PPO

## 2015-11-22 DIAGNOSIS — T84061A Wear of articular bearing surface of internal prosthetic left hip joint, initial encounter: Secondary | ICD-10-CM | POA: Diagnosis not present

## 2015-11-22 DIAGNOSIS — Y838 Other surgical procedures as the cause of abnormal reaction of the patient, or of later complication, without mention of misadventure at the time of the procedure: Secondary | ICD-10-CM | POA: Diagnosis present

## 2015-11-22 DIAGNOSIS — E669 Obesity, unspecified: Secondary | ICD-10-CM | POA: Diagnosis present

## 2015-11-22 DIAGNOSIS — J449 Chronic obstructive pulmonary disease, unspecified: Secondary | ICD-10-CM | POA: Diagnosis present

## 2015-11-22 DIAGNOSIS — Z885 Allergy status to narcotic agent status: Secondary | ICD-10-CM

## 2015-11-22 DIAGNOSIS — Z886 Allergy status to analgesic agent status: Secondary | ICD-10-CM

## 2015-11-22 DIAGNOSIS — H409 Unspecified glaucoma: Secondary | ICD-10-CM | POA: Diagnosis present

## 2015-11-22 DIAGNOSIS — Z96643 Presence of artificial hip joint, bilateral: Secondary | ICD-10-CM | POA: Diagnosis present

## 2015-11-22 DIAGNOSIS — Z96649 Presence of unspecified artificial hip joint: Secondary | ICD-10-CM

## 2015-11-22 DIAGNOSIS — E119 Type 2 diabetes mellitus without complications: Secondary | ICD-10-CM | POA: Diagnosis not present

## 2015-11-22 DIAGNOSIS — I10 Essential (primary) hypertension: Secondary | ICD-10-CM | POA: Diagnosis present

## 2015-11-22 DIAGNOSIS — F419 Anxiety disorder, unspecified: Secondary | ICD-10-CM | POA: Diagnosis present

## 2015-11-22 DIAGNOSIS — D649 Anemia, unspecified: Secondary | ICD-10-CM | POA: Diagnosis not present

## 2015-11-22 DIAGNOSIS — Z96642 Presence of left artificial hip joint: Secondary | ICD-10-CM | POA: Diagnosis not present

## 2015-11-22 DIAGNOSIS — Z87891 Personal history of nicotine dependence: Secondary | ICD-10-CM

## 2015-11-22 DIAGNOSIS — Z09 Encounter for follow-up examination after completed treatment for conditions other than malignant neoplasm: Secondary | ICD-10-CM

## 2015-11-22 DIAGNOSIS — T8484XA Pain due to internal orthopedic prosthetic devices, implants and grafts, initial encounter: Secondary | ICD-10-CM | POA: Diagnosis not present

## 2015-11-22 DIAGNOSIS — M25552 Pain in left hip: Secondary | ICD-10-CM | POA: Diagnosis not present

## 2015-11-22 DIAGNOSIS — Z6831 Body mass index (BMI) 31.0-31.9, adult: Secondary | ICD-10-CM

## 2015-11-22 DIAGNOSIS — Z79899 Other long term (current) drug therapy: Secondary | ICD-10-CM | POA: Diagnosis not present

## 2015-11-22 DIAGNOSIS — Z96651 Presence of right artificial knee joint: Secondary | ICD-10-CM | POA: Diagnosis not present

## 2015-11-22 DIAGNOSIS — M7989 Other specified soft tissue disorders: Secondary | ICD-10-CM | POA: Diagnosis not present

## 2015-11-22 DIAGNOSIS — Z471 Aftercare following joint replacement surgery: Secondary | ICD-10-CM | POA: Diagnosis not present

## 2015-11-22 DIAGNOSIS — T84031A Mechanical loosening of internal left hip prosthetic joint, initial encounter: Secondary | ICD-10-CM | POA: Diagnosis not present

## 2015-11-22 DIAGNOSIS — T84068A Wear of articular bearing surface of other internal prosthetic joint, initial encounter: Secondary | ICD-10-CM

## 2015-11-22 HISTORY — PX: TOTAL HIP REVISION: SHX763

## 2015-11-22 LAB — GLUCOSE, CAPILLARY
Glucose-Capillary: 130 mg/dL — ABNORMAL HIGH (ref 65–99)
Glucose-Capillary: 124 mg/dL — ABNORMAL HIGH (ref 65–99)

## 2015-11-22 SURGERY — TOTAL HIP REVISION
Anesthesia: General | Site: Hip | Laterality: Left

## 2015-11-22 MED ORDER — GLYCOPYRROLATE 0.2 MG/ML IV SOSY
PREFILLED_SYRINGE | INTRAVENOUS | Status: DC | PRN
Start: 2015-11-22 — End: 2015-11-22
  Administered 2015-11-22: .2 mg via INTRAVENOUS

## 2015-11-22 MED ORDER — ONDANSETRON HCL 4 MG/2ML IJ SOLN
4.0000 mg | Freq: Four times a day (QID) | INTRAMUSCULAR | Status: DC | PRN
  Administered 2015-11-22: 4 mg via INTRAVENOUS
  Filled 2015-11-22: qty 2

## 2015-11-22 MED ORDER — ACETAMINOPHEN 650 MG RE SUPP: 650.0000 mg | Freq: Four times a day (QID) | RECTAL | Status: DC | PRN

## 2015-11-22 MED ORDER — LIDOCAINE 2% (20 MG/ML) 5 ML SYRINGE
INTRAMUSCULAR | Status: DC | PRN
  Administered 2015-11-22: 100 mg via INTRAVENOUS

## 2015-11-22 MED ORDER — OXYCODONE HCL 5 MG PO TABS
5.0000 mg | ORAL_TABLET | ORAL | Status: DC | PRN
  Administered 2015-11-22 – 2015-11-23 (×3): 10 mg via ORAL
  Filled 2015-11-22 (×3): qty 2

## 2015-11-22 MED ORDER — MIDAZOLAM HCL 5 MG/5ML IJ SOLN
INTRAMUSCULAR | Status: DC | PRN
  Administered 2015-11-22 (×2): 1 mg via INTRAVENOUS

## 2015-11-22 MED ORDER — PROMETHAZINE HCL 25 MG/ML IJ SOLN: 6.2500 mg | INTRAMUSCULAR | Status: DC | PRN

## 2015-11-22 MED ORDER — PHENOL 1.4 % MT LIQD
1.0000 | OROMUCOSAL | Status: DC | PRN
Start: 2015-11-22 — End: 2015-11-24

## 2015-11-22 MED ORDER — PHENYLEPHRINE HCL 10 MG/ML IJ SOLN
10.0000 mg | INTRAMUSCULAR | Status: DC | PRN
  Administered 2015-11-22: 25 ug/min via INTRAVENOUS

## 2015-11-22 MED ORDER — METOCLOPRAMIDE HCL 5 MG PO TABS: 5.0000 mg | ORAL_TABLET | Freq: Three times a day (TID) | ORAL | Status: DC | PRN

## 2015-11-22 MED ORDER — MEPERIDINE HCL 25 MG/ML IJ SOLN: 6.2500 mg | INTRAMUSCULAR | Status: DC | PRN

## 2015-11-22 MED ORDER — LISINOPRIL 20 MG PO TABS
20.0000 mg | ORAL_TABLET | Freq: Two times a day (BID) | ORAL | Status: DC
  Administered 2015-11-22 – 2015-11-24 (×4): 20 mg via ORAL
  Filled 2015-11-22 (×4): qty 1

## 2015-11-22 MED ORDER — FENTANYL CITRATE (PF) 250 MCG/5ML IJ SOLN
INTRAMUSCULAR | Status: AC
  Filled 2015-11-22: qty 5

## 2015-11-22 MED ORDER — METHOCARBAMOL 1000 MG/10ML IJ SOLN
500.0000 mg | Freq: Four times a day (QID) | INTRAVENOUS | Status: DC | PRN
  Filled 2015-11-22: qty 5

## 2015-11-22 MED ORDER — BUPIVACAINE HCL (PF) 0.25 % IJ SOLN
INTRAMUSCULAR | Status: AC
  Filled 2015-11-22: qty 30

## 2015-11-22 MED ORDER — MIDAZOLAM HCL 2 MG/2ML IJ SOLN
INTRAMUSCULAR | Status: AC
  Filled 2015-11-22: qty 2

## 2015-11-22 MED ORDER — METOCLOPRAMIDE HCL 5 MG/ML IJ SOLN: 5.0000 mg | Freq: Three times a day (TID) | INTRAMUSCULAR | Status: DC | PRN

## 2015-11-22 MED ORDER — ASPIRIN EC 325 MG PO TBEC
325.0000 mg | DELAYED_RELEASE_TABLET | Freq: Every day | ORAL | Status: DC
  Administered 2015-11-23 – 2015-11-24 (×2): 325 mg via ORAL
  Filled 2015-11-22 (×2): qty 1

## 2015-11-22 MED ORDER — POLYETHYLENE GLYCOL 3350 17 G PO PACK: 17.0000 g | PACK | Freq: Every day | ORAL | Status: DC | PRN

## 2015-11-22 MED ORDER — DOCUSATE SODIUM 100 MG PO CAPS
100.0000 mg | ORAL_CAPSULE | Freq: Two times a day (BID) | ORAL | Status: DC
  Administered 2015-11-22 – 2015-11-24 (×4): 100 mg via ORAL
  Filled 2015-11-22 (×4): qty 1

## 2015-11-22 MED ORDER — LATANOPROST 0.005 % OP SOLN
1.0000 [drp] | Freq: Every day | OPHTHALMIC | Status: DC
  Administered 2015-11-22 – 2015-11-23 (×2): 1 [drp] via OPHTHALMIC
  Filled 2015-11-22: qty 2.5

## 2015-11-22 MED ORDER — MENTHOL 3 MG MT LOZG: 1.0000 | LOZENGE | OROMUCOSAL | Status: DC | PRN

## 2015-11-22 MED ORDER — BUPIVACAINE HCL (PF) 0.25 % IJ SOLN
INTRAMUSCULAR | Status: DC | PRN
  Administered 2015-11-22: 30 mL

## 2015-11-22 MED ORDER — SODIUM CHLORIDE 0.9 % IR SOLN
Status: DC | PRN
  Administered 2015-11-22: 1000 mL

## 2015-11-22 MED ORDER — SODIUM CHLORIDE 0.45 % IV SOLN: INTRAVENOUS | Status: DC

## 2015-11-22 MED ORDER — ACETAMINOPHEN 325 MG PO TABS: 650.0000 mg | ORAL_TABLET | Freq: Four times a day (QID) | ORAL | Status: DC | PRN

## 2015-11-22 MED ORDER — METHOCARBAMOL 500 MG PO TABS
500.0000 mg | ORAL_TABLET | Freq: Four times a day (QID) | ORAL | Status: DC | PRN
  Administered 2015-11-23 (×2): 500 mg via ORAL
  Filled 2015-11-22 (×2): qty 1

## 2015-11-22 MED ORDER — SUGAMMADEX SODIUM 500 MG/5ML IV SOLN
INTRAVENOUS | Status: DC | PRN
  Administered 2015-11-22: 161.4 mg via INTRAVENOUS

## 2015-11-22 MED ORDER — VECURONIUM BROMIDE 10 MG IV SOLR
INTRAVENOUS | Status: DC | PRN
  Administered 2015-11-22: 6 mg via INTRAVENOUS
  Administered 2015-11-22: 2 mg via INTRAVENOUS

## 2015-11-22 MED ORDER — FENTANYL CITRATE (PF) 100 MCG/2ML IJ SOLN
25.0000 ug | INTRAMUSCULAR | Status: DC | PRN
  Administered 2015-11-22 (×4): 25 ug via INTRAVENOUS

## 2015-11-22 MED ORDER — FERROUS SULFATE 325 (65 FE) MG PO TABS
325.0000 mg | ORAL_TABLET | ORAL | Status: DC
  Administered 2015-11-24: 325 mg via ORAL
  Filled 2015-11-22: qty 1

## 2015-11-22 MED ORDER — HYDROMORPHONE HCL 1 MG/ML IJ SOLN: 1.0000 mg | INTRAMUSCULAR | Status: DC | PRN

## 2015-11-22 MED ORDER — EPHEDRINE SULFATE-NACL 50-0.9 MG/10ML-% IV SOSY
PREFILLED_SYRINGE | INTRAVENOUS | Status: DC | PRN
  Administered 2015-11-22: 5 mg via INTRAVENOUS

## 2015-11-22 MED ORDER — FENTANYL CITRATE (PF) 100 MCG/2ML IJ SOLN
INTRAMUSCULAR | Status: DC | PRN
  Administered 2015-11-22 (×4): 50 ug via INTRAVENOUS

## 2015-11-22 MED ORDER — ONDANSETRON HCL 4 MG/2ML IJ SOLN
INTRAMUSCULAR | Status: DC | PRN
  Administered 2015-11-22: 4 mg via INTRAVENOUS

## 2015-11-22 MED ORDER — CEFAZOLIN IN D5W 1 GM/50ML IV SOLN
1.0000 g | Freq: Three times a day (TID) | INTRAVENOUS | Status: AC
  Administered 2015-11-22 – 2015-11-23 (×2): 1 g via INTRAVENOUS
  Filled 2015-11-22 (×2): qty 50

## 2015-11-22 MED ORDER — ONDANSETRON HCL 4 MG PO TABS: 4.0000 mg | ORAL_TABLET | Freq: Four times a day (QID) | ORAL | Status: DC | PRN

## 2015-11-22 MED ORDER — PROPOFOL 10 MG/ML IV BOLUS
INTRAVENOUS | Status: DC | PRN
  Administered 2015-11-22: 200 mg via INTRAVENOUS

## 2015-11-22 MED ORDER — CHLORHEXIDINE GLUCONATE 4 % EX LIQD: 60.0000 mL | Freq: Once | CUTANEOUS | Status: DC

## 2015-11-22 MED ORDER — AMLODIPINE BESYLATE 5 MG PO TABS
5.0000 mg | ORAL_TABLET | Freq: Every day | ORAL | Status: DC
  Administered 2015-11-23 – 2015-11-24 (×2): 5 mg via ORAL
  Filled 2015-11-22 (×2): qty 1

## 2015-11-22 MED ORDER — LACTATED RINGERS IV SOLN
INTRAVENOUS | Status: DC
  Administered 2015-11-22 (×3): via INTRAVENOUS

## 2015-11-22 MED ORDER — FENTANYL CITRATE (PF) 100 MCG/2ML IJ SOLN
INTRAMUSCULAR | Status: AC
  Filled 2015-11-22: qty 2

## 2015-11-22 MED ORDER — POTASSIUM CHLORIDE CRYS ER 20 MEQ PO TBCR
20.0000 meq | EXTENDED_RELEASE_TABLET | Freq: Every day | ORAL | Status: DC
  Administered 2015-11-22 – 2015-11-23 (×2): 20 meq via ORAL
  Filled 2015-11-22 (×2): qty 1

## 2015-11-22 SURGICAL SUPPLY — 77 items
APL SKNCLS STERI-STRIP NONHPOA (GAUZE/BANDAGES/DRESSINGS) ×1
BENZOIN TINCTURE PRP APPL 2/3 (GAUZE/BANDAGES/DRESSINGS) ×2 IMPLANT
BLADE SURG ROTATE 9660 (MISCELLANEOUS) IMPLANT
BRUSH FEMORAL CANAL (MISCELLANEOUS) IMPLANT
CLSR STERI-STRIP ANTIMIC 1/2X4 (GAUZE/BANDAGES/DRESSINGS) ×1 IMPLANT
COVER BACK TABLE 24X17X13 BIG (DRAPES) ×2 IMPLANT
COVER SURGICAL LIGHT HANDLE (MISCELLANEOUS) ×2 IMPLANT
DECANTER SPIKE VIAL GLASS SM (MISCELLANEOUS) ×1 IMPLANT
DRAPE C-ARM 42X72 X-RAY (DRAPES) IMPLANT
DRAPE IMP U-DRAPE 54X76 (DRAPES) ×2 IMPLANT
DRAPE INCISE IOBAN 66X45 STRL (DRAPES) IMPLANT
DRAPE ORTHO SPLIT 77X108 STRL (DRAPES) ×4
DRAPE SURG ORHT 6 SPLT 77X108 (DRAPES) ×2 IMPLANT
DRAPE U-SHAPE 47X51 STRL (DRAPES) ×2 IMPLANT
DRSG AQUACEL AG ADV 3.5X10 (GAUZE/BANDAGES/DRESSINGS) ×1 IMPLANT
DRSG PAD ABDOMINAL 8X10 ST (GAUZE/BANDAGES/DRESSINGS) ×4 IMPLANT
DURAPREP 26ML APPLICATOR (WOUND CARE) ×2 IMPLANT
ELECT BLADE 4.0 EZ CLEAN MEGAD (MISCELLANEOUS) ×2
ELECT CAUTERY BLADE 6.4 (BLADE) ×2 IMPLANT
ELECT REM PT RETURN 9FT ADLT (ELECTROSURGICAL) ×2
ELECTRODE BLDE 4.0 EZ CLN MEGD (MISCELLANEOUS) IMPLANT
ELECTRODE REM PT RTRN 9FT ADLT (ELECTROSURGICAL) ×1 IMPLANT
EVACUATOR 1/8 PVC DRAIN (DRAIN) IMPLANT
FACESHIELD WRAPAROUND (MASK) ×4 IMPLANT
FACESHIELD WRAPAROUND OR TEAM (MASK) ×2 IMPLANT
GAUZE SPONGE 4X4 12PLY STRL (GAUZE/BANDAGES/DRESSINGS) ×2 IMPLANT
GAUZE XEROFORM 5X9 LF (GAUZE/BANDAGES/DRESSINGS) ×2 IMPLANT
GLOVE BIOGEL PI IND STRL 8 (GLOVE) ×2 IMPLANT
GLOVE BIOGEL PI INDICATOR 8 (GLOVE) ×2
GLOVE ORTHO TXT STRL SZ7.5 (GLOVE) ×4 IMPLANT
GOWN STRL REUS W/ TWL LRG LVL3 (GOWN DISPOSABLE) ×1 IMPLANT
GOWN STRL REUS W/ TWL XL LVL3 (GOWN DISPOSABLE) ×1 IMPLANT
GOWN STRL REUS W/TWL 2XL LVL3 (GOWN DISPOSABLE) ×2 IMPLANT
GOWN STRL REUS W/TWL LRG LVL3 (GOWN DISPOSABLE) ×2
GOWN STRL REUS W/TWL XL LVL3 (GOWN DISPOSABLE) ×2
HANDPIECE INTERPULSE COAX TIP (DISPOSABLE)
HEAD FEM LRG 28X+0 (Hips) ×1 IMPLANT
IMMOBILIZER KNEE 20 (SOFTGOODS) IMPLANT
IMMOBILIZER KNEE 22 UNIV (SOFTGOODS) ×1 IMPLANT
IMMOBILIZER KNEE 24 THIGH 36 (MISCELLANEOUS) IMPLANT
IMMOBILIZER KNEE 24 UNIV (MISCELLANEOUS)
KIT BASIN OR (CUSTOM PROCEDURE TRAY) ×2 IMPLANT
KIT ROOM TURNOVER OR (KITS) ×2 IMPLANT
LINER ACET 10D LIP 28X50 (Hips) ×1 IMPLANT
MANIFOLD NEPTUNE II (INSTRUMENTS) ×2 IMPLANT
NDL 1/2 CIR MAYO (NEEDLE) ×1 IMPLANT
NDL HYPO 25GX1X1/2 BEV (NEEDLE) ×1 IMPLANT
NEEDLE 1/2 CIR MAYO (NEEDLE) ×2 IMPLANT
NEEDLE HYPO 25GX1X1/2 BEV (NEEDLE) ×2 IMPLANT
NS IRRIG 1000ML POUR BTL (IV SOLUTION) ×2 IMPLANT
PACK TOTAL JOINT (CUSTOM PROCEDURE TRAY) ×2 IMPLANT
PACK UNIVERSAL I (CUSTOM PROCEDURE TRAY) ×2 IMPLANT
PAD ARMBOARD 7.5X6 YLW CONV (MISCELLANEOUS) ×4 IMPLANT
REAMER ROD DEEP FLUTE 2.5X950 (INSTRUMENTS) IMPLANT
RING LOCK ACET OD 50 (Hips) ×1 IMPLANT
SET HNDPC FAN SPRY TIP SCT (DISPOSABLE) IMPLANT
SPONGE LAP 4X18 X RAY DECT (DISPOSABLE) ×4 IMPLANT
STAPLER VISISTAT 35W (STAPLE) ×2 IMPLANT
SUCTION FRAZIER HANDLE 10FR (MISCELLANEOUS) ×1
SUCTION TUBE FRAZIER 10FR DISP (MISCELLANEOUS) ×1 IMPLANT
SUT ETHIBOND NAB CT1 #1 30IN (SUTURE) ×6 IMPLANT
SUT TICRON (SUTURE) ×4 IMPLANT
SUT VIC AB 0 CT1 27 (SUTURE) ×4
SUT VIC AB 0 CT1 27XBRD ANBCTR (SUTURE) IMPLANT
SUT VIC AB 1 CTX 36 (SUTURE) ×2
SUT VIC AB 1 CTX36XBRD ANBCTR (SUTURE) IMPLANT
SUT VIC AB 2-0 CT1 27 (SUTURE) ×6
SUT VIC AB 2-0 CT1 TAPERPNT 27 (SUTURE) ×3 IMPLANT
SUT VIC AB 3-0 SH 27 (SUTURE) ×2
SUT VIC AB 3-0 SH 27X BRD (SUTURE) IMPLANT
SUT VICRYL 0 TIES 12 18 (SUTURE) ×2 IMPLANT
SYR CONTROL 10ML LL (SYRINGE) ×2 IMPLANT
TOWEL OR 17X24 6PK STRL BLUE (TOWEL DISPOSABLE) ×2 IMPLANT
TOWEL OR 17X26 10 PK STRL BLUE (TOWEL DISPOSABLE) ×2 IMPLANT
TOWER CARTRIDGE SMART MIX (DISPOSABLE) IMPLANT
TRAY FOLEY CATH 16FRSI W/METER (SET/KITS/TRAYS/PACK) IMPLANT
WATER STERILE IRR 1000ML POUR (IV SOLUTION) ×8 IMPLANT

## 2015-11-22 NOTE — Transfer of Care (Signed)
Immediate Anesthesia Transfer of Care Note  Patient: Paralee CancelWanda S Gatt  Procedure(s) Performed: Procedure(s): Revision Left Total Hip Arthroplasty, Poly and Ball Exchange (Left)  Patient Location: PACU  Anesthesia Type:General  Level of Consciousness: awake, alert , oriented and patient cooperative  Airway & Oxygen Therapy: Patient Spontanous Breathing and Patient connected to nasal cannula oxygen  Post-op Assessment: Report given to RN and Post -op Vital signs reviewed and stable  Post vital signs: Reviewed and stable  Last Vitals:  Filed Vitals:   11/22/15 1019 11/22/15 1449  BP: 199/86   Pulse:    Temp:  36.5 C  Resp:      Last Pain: There were no vitals filed for this visit.    Patients Stated Pain Goal: 3 (11/22/15 1013)  Complications: No apparent anesthesia complications

## 2015-11-22 NOTE — Anesthesia Postprocedure Evaluation (Signed)
Anesthesia Post Note  Patient: Gwendolyn Snyder  Procedure(s) Performed: Procedure(s) (LRB): Revision Left Total Hip Arthroplasty, Poly and Ball Exchange (Left)  Patient location during evaluation: PACU Anesthesia Type: General Level of consciousness: awake and alert Pain management: pain level controlled Vital Signs Assessment: post-procedure vital signs reviewed and stable Respiratory status: spontaneous breathing, nonlabored ventilation, respiratory function stable and patient connected to nasal cannula oxygen Cardiovascular status: blood pressure returned to baseline and stable Postop Assessment: no signs of nausea or vomiting Anesthetic complications: no    Last Vitals:  Filed Vitals:   11/22/15 1616 11/22/15 1653  BP:  136/65  Pulse:  68  Temp: 36.6 C 36.8 C  Resp:  20    Last Pain:  Filed Vitals:   11/22/15 1709  PainSc: 0-No pain                 Kennieth RadFitzgerald, Erica Osuna E

## 2015-11-22 NOTE — H&P (Signed)
TOTAL HIP REVISION ADMISSION H&P  Patient is admitted for left revision total hip arthroplasty.  Subjective:  Chief Complaint: left hip pain  HPI: Gwendolyn Snyder, 65 y.o. female, has a history of pain and functional disability in the left hip due to total hip poly wear  and patient has failed non-surgical conservative treatments for greater than 12 weeks. The indications for the revision total hip arthroplasty are bearing surface wear leading to  symptomatic synovitis.  Onset of symptoms was gradual starting 10 years ago with gradually worsening course since that time.   Patient currently rates pain in the left hip at 10 out of 10 with activity.  There is night pain, worsening of pain with activity and weight bearing, trendelenberg gait and pain that interfers with activities of daily living. Patient has evidence of poly wear.  This condition presents safety issues increasing the risk of falls.   There are no active problems to display for this patient.  Past Medical History  Diagnosis Date  . COPD (chronic obstructive pulmonary disease) ([facility])   . Anxiety   . Hypertension   . Diabetes mellitus without complication ([facility])     diet controlled , A1C done in April 6.2  . History of hiatal hernia   . Arthritis   . Anemia   . Glaucoma   . Cataracts, bilateral   . Complication of anesthesia     once after surgery felt like she could not breathe- 11/09/15- surgery greater than 11 years ago  . Family history of adverse reaction to anesthesia     sister- confused  . Shortness of breath dyspnea     with exertion    Past Surgical History  Procedure Laterality Date  . Carpal tunnel release Right   . Total knee arthroplasty Right   . Total hip arthroplasty Bilateral   . Abdominal hysterectomy    . Removal kidney stone    . Colonoscopy      Prescriptions prior to admission  Medication Sig Dispense Refill Last Dose  . acetaminophen (TYLENOL) 650 MG CR tablet Take 1,300 mg by mouth 2 (two)  times daily.   11/21/2015 at Unknown time  . amLODipine (NORVASC) 5 MG tablet Take 1 tablet by mouth daily.   11/22/2015 at 0630  . cholecalciferol (VITAMIN D) 1000 units tablet Take 1,000 Units by mouth daily.   10/02/15  . ferrous sulfate 325 (65 FE) MG tablet Take 325 mg by mouth 3 (three) times a week. Mon Wed Fri   2 wks ago  . Inositol Niacinate (NIACIN FLUSH FREE) 500 MG CAPS Take 1 capsule by mouth 2 (two) times daily.   10/02/15  . latanoprost (XALATAN) 0.005 % ophthalmic solution Place 1 drop into both eyes at bedtime.   11/21/2015 at Unknown time  . lisinopril (PRINIVIL,ZESTRIL) 20 MG tablet Take 1 tablet by mouth 2 (two) times daily.   11/21/2015 at Unknown time  . potassium chloride SA (K-DUR,KLOR-CON) 20 MEQ tablet Take 1 tablet by mouth daily.   11/21/2015 at Unknown time   Allergies  Allergen Reactions  . Aspirin Other (See Comments)    Makes ears ring and heart beat fast  . Codeine Other (See Comments)    Hard to wake up    Social History  Substance Use Topics  . Smoking status: Former Smoker -- 1.00 packs/day for 30 years    Types: Cigarettes    Quit date: 05/13/2004  . Smokeless tobacco: Never Used  . Alcohol Use: No  History reviewed. No pertinent family history.    Review of Systems  Constitutional: Negative.   HENT: Negative.   Eyes: Negative.   Respiratory: Negative.   Cardiovascular: Negative.   Gastrointestinal: Negative.   Genitourinary: Negative.   Musculoskeletal: Positive for joint pain.  Neurological: Negative.   Psychiatric/Behavioral: Negative.     Objective:  Physical Exam  Constitutional: She is oriented to person, place, and time. No distress.  HENT:  Head: Atraumatic.  Eyes: EOM are normal.  Neck: Normal range of motion.  Cardiovascular: Normal rate.   Respiratory: No respiratory distress.  GI: She exhibits no distension.  Musculoskeletal: She exhibits tenderness.  Neurological: She is alert and oriented to person, place, and time.   Skin: Skin is warm and dry.  Psychiatric: She has a normal mood and affect.    Vital signs in last 24 hours: Temp:  [99.2 F (37.3 C)] 99.2 F (37.3 C) (07/12 1013) Pulse Rate:  [98] 98 (07/12 1013) Resp:  [20] 20 (07/12 1013) BP: (199-210)/(86-88) 199/86 mmHg (07/12 1019) SpO2:  [100 %] 100 % (07/12 1013) Weight:  [80.74 kg (178 lb)] 80.74 kg (178 lb) (07/12 1013)   Labs:   Estimated body mass index is 31.54 kg/(m^2) as calculated from the following:   Height as of this encounter:  (1.6 m).   Weight as of this encounter: 80.74 kg (178 lb).  Imaging Review:  Plain radiographs demonstrate poly wear. The bone quality appears to be good for age and reported activity level. T Assessment/Plan:  End stage arthritis, left hip(s) with failed previous arthroplasty.  The patient history, physical examination, clinical judgement of the provider and imaging studies are consistent with end stage degenerative joint disease of the left hip(s), previous total hip arthroplasty. Revision total hip arthroplasty is deemed medically necessary. The treatment options including medical management, injection therapy, arthroscopy and arthroplasty were discussed at length. The risks and benefits of total hip arthroplasty were presented and reviewed. The risks due to aseptic loosening, infection, stiffness, dislocation/subluxation,  thromboembolic complications and other imponderables were discussed.  The patient acknowledged the explanation, agreed to proceed with the plan and consent was signed. Patient is being admitted for inpatient treatment for surgery, pain control, PT, OT, prophylactic antibiotics, VTE prophylaxis, progressive ambulation and ADL's and discharge planning. The patient is planning to be discharged home with home health services

## 2015-11-22 NOTE — Anesthesia Procedure Notes (Signed)
Procedure Name: Intubation Date/Time: 11/22/2015 1:01 PM Performed by: Bobbie StackANDERSON, Nigel Wessman KIRSTEN Pre-anesthesia Checklist: Patient identified, Emergency Drugs available, Suction available, Patient being monitored and Timeout performed Patient Re-evaluated:Patient Re-evaluated prior to inductionOxygen Delivery Method: Circle system utilized Preoxygenation: Pre-oxygenation with 100% oxygen Intubation Type: IV induction Ventilation: Mask ventilation without difficulty and Oral airway inserted - appropriate to patient size Laryngoscope Size: Hyacinth MeekerMiller and 2 Grade View: Grade I Tube type: Oral Tube size: 7.0 mm Number of attempts: 1 Airway Equipment and Method: Stylet Placement Confirmation: ETT inserted through vocal cords under direct vision,  positive ETCO2 and breath sounds checked- equal and bilateral Secured at: 23 cm Tube secured with: Tape Dental Injury: Teeth and Oropharynx as per pre-operative assessment

## 2015-11-22 NOTE — Progress Notes (Signed)
Orthopedic Tech Progress Note Patient Details:  Gwendolyn Snyder 1950-12-24 [id number] Patient already has knee immobilizer on. Patient ID: Gwendolyn Snyder, female   DOB: 1950-12-24, 65 y.o.   MRN: [id number]   Jennye MoccasinHughes, Aaron Boeh Craig 11/22/2015, 7:40 PM

## 2015-11-22 NOTE — Interval H&P Note (Signed)
History and Physical Interval Note:  11/22/2015 12:42 PM  Gwendolyn Snyder  has presented today for surgery, with the diagnosis of Painful Left Total Hip Arthroplasty  The various methods of treatment have been discussed with the patient and family. After consideration of risks, benefits and other options for treatment, the patient has consented to  Procedure(s): Revision Left Total Hip Arthroplasty, Poly and Ball Exchange (Left) as a surgical intervention .  The patient's history has been reviewed, patient examined, no change in status, stable for surgery.  I have reviewed the patient's chart and labs.  Questions were answered to the patient's satisfaction.     YATES,MARK C

## 2015-11-22 NOTE — Anesthesia Preprocedure Evaluation (Signed)
Anesthesia Evaluation  Patient identified by MRN, date of birth, ID band Patient awake    Reviewed: Allergy & Precautions, H&P , NPO status , Patient's Chart, lab work & pertinent test results  Airway Mallampati: II  TM Distance: >3 FB Neck ROM: full    Dental no notable dental hx.    Pulmonary former smoker,    Pulmonary exam normal breath sounds clear to auscultation       Cardiovascular Exercise Tolerance: Good hypertension, Pt. on medications Normal cardiovascular exam     Neuro/Psych negative neurological ROS     GI/Hepatic Neg liver ROS,   Endo/Other    Renal/GU negative Renal ROS  negative genitourinary   Musculoskeletal   Abdominal (+) + obese,   Peds  Hematology negative hematology ROS (+)   Anesthesia Other Findings   Reproductive/Obstetrics negative OB ROS                             Anesthesia Physical Anesthesia Plan  ASA: II  Anesthesia Plan: General   Post-op Pain Management:    Induction: Intravenous  Airway Management Planned: Oral ETT  Additional Equipment:   Intra-op Plan:   Post-operative Plan: Extubation in OR  Informed Consent: I have reviewed the patients History and Physical, chart, labs and discussed the procedure including the risks, benefits and alternatives for the proposed anesthesia with the patient or authorized representative who has indicated his/her understanding and acceptance.   Dental Advisory Given  Plan Discussed with: CRNA and Surgeon  Anesthesia Plan Comments:         Anesthesia Quick Evaluation

## 2015-11-23 ENCOUNTER — Encounter (HOSPITAL_COMMUNITY): Payer: Self-pay | Admitting: Orthopaedic Surgery

## 2015-11-23 LAB — GLUCOSE, CAPILLARY
Glucose-Capillary: 111 mg/dL — ABNORMAL HIGH (ref 65–99)
Glucose-Capillary: 119 mg/dL — ABNORMAL HIGH (ref 65–99)
Glucose-Capillary: 166 mg/dL — ABNORMAL HIGH (ref 65–99)
Glucose-Capillary: 114 mg/dL — ABNORMAL HIGH (ref 65–99)
Glucose-Capillary: 135 mg/dL — ABNORMAL HIGH (ref 65–99)

## 2015-11-23 LAB — CBC
HCT: 30.3 % — ABNORMAL LOW (ref 36.0–46.0)
Hemoglobin: 9 g/dL — ABNORMAL LOW (ref 12.0–15.0)
MCH: 23.7 pg — ABNORMAL LOW (ref 26.0–34.0)
MCHC: 29.7 g/dL — ABNORMAL LOW (ref 30.0–36.0)
MCV: 79.9 fL (ref 78.0–100.0)
Platelets: 262 10*3/uL (ref 150–400)
RBC: 3.79 MIL/uL — ABNORMAL LOW (ref 3.87–5.11)
RDW: 18.3 % — ABNORMAL HIGH (ref 11.5–15.5)
WBC: 9.5 10*3/uL (ref 4.0–10.5)

## 2015-11-23 LAB — BASIC METABOLIC PANEL
Anion gap: 8 (ref 5–15)
BUN: 14 mg/dL (ref 6–20)
CO2: 22 mmol/L (ref 22–32)
Calcium: 8.6 mg/dL — ABNORMAL LOW (ref 8.9–10.3)
Chloride: 110 mmol/L (ref 101–111)
Creatinine, Ser: 1.01 mg/dL — ABNORMAL HIGH (ref 0.44–1.00)
GFR calc Af Amer: >60 mL/min (ref >60)
GFR calc non Af Amer: 58 mL/min — ABNORMAL LOW (ref >60)
Glucose, Bld: 102 mg/dL — ABNORMAL HIGH (ref 65–99)
Potassium: 2.8 mmol/L — ABNORMAL LOW (ref 3.5–5.1)
Sodium: 140 mmol/L (ref 135–145)

## 2015-11-23 NOTE — Progress Notes (Signed)
Subjective: 1 Day Post-Op Procedure(s) (LRB): Revision Left Total Hip Arthroplasty, Poly and Ball Exchange (Left) Patient reports pain as mild and moderate.    Objective: Vital signs in last 24 hours: Temp:  [97.4 F (36.3 C)-99.2 F (37.3 C)] 97.4 F (36.3 C) (07/12 2224) Pulse Rate:  [60-98] 63 (07/12 2224) Resp:  [9-21] 16 (07/12 2224) BP: (128-210)/(64-88) 128/64 mmHg (07/12 2224) SpO2:  [99 %-100 %] 100 % (07/12 2224) Weight:  [80.74 kg (178 lb)] 80.74 kg (178 lb) (07/12 1013)  Intake/Output from previous day: 07/12 0701 - 07/13 0700 In: 1710 [P.O.:360; I.V.:1350] Out: 425 [Urine:275; Blood:150] Intake/Output this shift:    No results for input(s): HGB in the last 72 hours. No results for input(s): WBC, RBC, HCT, PLT in the last 72 hours. No results for input(s): NA, K, CL, CO2, BUN, CREATININE, GLUCOSE, CALCIUM in the last 72 hours. No results for input(s): LABPT, INR in the last 72 hours.  Neurologically intact  Assessment/Plan: 1 Day Post-Op Procedure(s) (LRB): Revision Left Total Hip Arthroplasty, Poly and Ball Exchange (Left) Up with therapy  YATES,MARK C 11/23/2015, 7:21 AM

## 2015-11-23 NOTE — Evaluation (Signed)
Physical Therapy Evaluation Patient Details Name: Gwendolyn CancelWanda S Snyder MRN: [id number] DOB: [birth year] Today's Date: 11/23/2015   History of Present Illness  pt presents with L TKR Revision.  pt with hx of Bil THR, R TKR, R Carpal Tunnel Release, Glaucoma, COPD, Anxiety, HTN, DM, and Anemia.    Clinical Impression  Pt very motivated and follows all cueing well.  Anticipate pt will make great progress to D/C to her brother's home for family support since pt lives alone normally.  Will continue to follow.      Follow Up Recommendations Home health PT;Supervision/Assistance - 24 hour    Equipment Recommendations  None recommended by PT    Recommendations for Other Services       Precautions / Restrictions Precautions Precautions: Fall;Posterior Hip Precaution Booklet Issued: Yes (comment) Precaution Comments: pt able to recall 2/3 precautions.   Required Braces or Orthoses: Knee Immobilizer - Left Knee Immobilizer - Left: On at all times Restrictions Weight Bearing Restrictions: Yes LLE Weight Bearing: Weight bearing as tolerated      Mobility  Bed Mobility Overal bed mobility: Needs Assistance Bed Mobility: Supine to Sit     Supine to sit: Min assist;HOB elevated     General bed mobility comments: A with bringing L LE towards EOB.  pt needs increased time to complete and with HOB elevated.    Transfers Overall transfer level: Needs assistance Equipment used: Rolling walker (2 wheeled) Transfers: Sit to/from Stand Sit to Stand: Min guard         General transfer comment: cues for UE use.  pt needs increased time, but follows cueing well.    Ambulation/Gait Ambulation/Gait assistance: Min guard Ambulation Distance (Feet): 120 Feet Assistive device: Rolling walker (2 wheeled) Gait Pattern/deviations: Step-through pattern;Decreased stride length     General Gait Details: pt initially more of a step-to pattern, but with increased distance pt indicates decreased  tightness and able to perform more fluid gait pattern.    Stairs            Wheelchair Mobility    Modified Rankin (Stroke Patients Only)       Balance Overall balance assessment: Needs assistance Sitting-balance support: No upper extremity supported;Feet supported Sitting balance-Leahy Scale: Good     Standing balance support: Bilateral upper extremity supported;Single extremity supported;During functional activity Standing balance-Leahy Scale: Fair                               Pertinent Vitals/Pain Pain Assessment: 0-10 Pain Score: 4  Pain Location: L hip Pain Descriptors / Indicators: Tightness;Sore Pain Intervention(s): Monitored during session;Premedicated before session;Repositioned    Home Living Family/patient expects to be discharged to:: Private residence Living Arrangements: Alone (pt to stay at brother's initially.  ) Available Help at Discharge: Family;Available 24 hours/day Type of Home: House Home Access: Stairs to enter Entrance Stairs-Rails: Right Entrance Stairs-Number of Steps: 2 at brother's (5 at her home) Home Layout: One level Home Equipment: Walker - 2 wheels;Adaptive equipment;Bedside commode (Lift chair)      Prior Function Level of Independence: Independent               Hand Dominance        Extremity/Trunk Assessment   Upper Extremity Assessment: Defer to OT evaluation           Lower Extremity Assessment: LLE deficits/detail   LLE Deficits / Details: Sensation intact.  Order for Knee Immobilizer at all  times, so not able to fully assess.  Hip generally weak post op.  Cervical / Trunk Assessment: Normal  Communication   Communication: HOH  Cognition Arousal/Alertness: Awake/alert Behavior During Therapy: WFL for tasks assessed/performed Overall Cognitive Status: Within Functional Limits for tasks assessed                      General Comments      Exercises        Assessment/Plan     PT Assessment Patient needs continued PT services  PT Diagnosis Abnormality of gait   PT Problem List Decreased strength;Decreased activity tolerance;Decreased balance;Decreased mobility;Decreased knowledge of use of DME;Decreased knowledge of precautions  PT Treatment Interventions DME instruction;Gait training;Stair training;Functional mobility training;Therapeutic activities;Therapeutic exercise;Balance training;Patient/family education   PT Goals (Current goals can be found in the Care Plan section) Acute Rehab PT Goals Patient Stated Goal: Home PT Goal Formulation: With patient Time For Goal Achievement: 11/30/15 Potential to Achieve Goals: Good    Frequency 7X/week   Barriers to discharge        Co-evaluation               End of Session Equipment Utilized During Treatment: Gait belt;Left knee immobilizer Activity Tolerance: Patient tolerated treatment well Patient left: in chair;with call bell/phone within reach Nurse Communication: Mobility status         Time: 1610-9604 PT Time Calculation (min) (ACUTE ONLY): 24 min   Charges:   PT Evaluation $PT Eval Low Complexity: 1 Procedure PT Treatments $Gait Training: 8-22 mins   PT G CodesSunny Snyder, [place] [phone] 11/23/2015, 10:28 AM

## 2015-11-23 NOTE — Care Management Important Message (Signed)
Important Message  Patient Details  Name: Paralee CancelWanda S Horan MRN: [id number] Date of Birth: 1950-08-16   Medicare Important Message Given:  Yes    Bernadette HoitShoffner, Khaidyn Staebell Coleman 11/23/2015, 8:23 AM

## 2015-11-23 NOTE — Op Note (Signed)
NAMWaymon Budge:  Beidler, Alverta                ACCOUNT NO.:  [id number]  MEDICAL RECORD NO.:  [id number]  LOCATION:  [id number]                        FACILITY:  [facility]  PHYSICIAN:  Ulah Olmo C. Ophelia CharterYates, M.D.    DATE OF BIRTH:  02-14-1951  DATE OF PROCEDURE:  11/22/2015 DATE OF DISCHARGE:                              OPERATIVE REPORT   PREOPERATIVE DIAGNOSIS:  Painful left total hip polyethylene debris.  POSTOPERATIVE DIAGNOSIS:  Painful left total hip polyethylene debris.  PROCEDURE:  Left hip revision, ball and polyethylene exchange.  SURGEON:  Wilmer Santillo C. Ophelia CharterYates, MD.  ASSISTANT:  Genene ChurnJames M. Barry Dieneswens, PA-C, medically necessary and present for the entire procedure.  ANESTHESIA:  General.  ESTIMATED BLOOD LOSS:  Less than 200 mL.  DRAINS:  None.  BRIEF HISTORY:  A 65 year old female, who originally had a total hip arthroplasty done by Dr. Ophelia CharterYates in 1989.  She is now well over 20 years out and has had eccentric polyethylene wear with standing x-ray showing and documenting this on the left, total hip arthroplasty on the right which was done several years later, continues to function well.  She has had appropriate workup including sed rate, CRP, aspiration, and plain radiographs that showed no evidence of loosening.  DESCRIPTION OF PROCEDURE:  After standard prepping and draping, with the patient in lateral position, Foley catheter been placed before the patient is placed in lateral position.  After intubation, axillary roll was used as usual split sheets, impervious stockinette, Coban, sterile skin marker, and Betadine and Steri-Drape x2 was used to seal the skin. Time-out procedure completed.  Ancef given prophylactically.  Posterior approach was made.  Old sutures were noted in the gluteus maximus which was split.  Posterior capsule was opened and there was typical rice body, granulation tissue present which was originally cultured, aerobic and anaerobic with no evidence of purulence.  There was  typical polyethylene debris present and some tissue was also taken from the granulation descent for pathology.  Once soft tissue was meticulously clean, performing capsulectomy primarily posterior, inferior, some anterior, soft tissue was cleaned off.  The poly around the edge and then the cup was extracted using the cup extractor after an osteotome was used initially, quarter-inch on the edge.  The acetabulum was secured.  Copious irrigation.  The ball had been removed.  The patient had 28 ball, 15 mm shell, and a 28/50 ultra-high molecular weight polyethylene liner was inserted with 10-degree lip, identical what she had had in with the lip placed posterolaterally.  The new ball identical length was placed.  Hip was reduced.  There was some instability at 60 degrees with hip abducted, but the patient had no instability problems for many years.  She will need to be stayed in the knee immobilizer, but we did not want to go up on neck length since it would maker her left leg longer than the right.  Copious irrigation followed by standard layered closure, tensor fascia, gluteus maximus; 2-0 Vicryl subcutaneous tissue reapproximation. Skin closure.  Postop dressing and knee immobilizer.  Instrument count and needle count was correct.     Stella Encarnacion C. Ophelia CharterYates, M.D.     MCY/MEDQ  D:  11/22/2015  T:  11/23/2015  Job:  [id number]

## 2015-11-23 NOTE — Progress Notes (Signed)
Has been Summary Care Management Handoff Summary  Patient Details  Name: Gwendolyn Snyder  MRN: [id number]  DOB: [birth year]  Durenda GuthrieBrady, Ilya Neely Naomi, RN 11/23/2015, 2:59 PM    To Do

## 2015-11-23 NOTE — Care Management Note (Signed)
Case Management Note  Patient Details  Name: Gwendolyn Snyder MRN: [id number] Date of Birth: November 18, 1950  Subjective/Objective:  65 yr old female s/p left hip revision.                  Action/Plan: Case manager spoke with patient at the bedside concerning discharge needs and DME.  Patient states that Dr. Ophelia CharterYates explained to her she will not need therapy, she didn't use it with her previous hip surgeries. Patient states she has a rolling walker, needs a 3in1. CM will order. Patient states she will be staying with her brother for a few days and then return to her home.      Expected Discharge Date:    11/24/15              Expected Discharge Plan:  Home/Self Care  In-House Referral:     Discharge planning Services  CM Consult  Post Acute Care Choice:  Durable Medical Equipment Choice offered to:  Patient  DME Arranged:  3-N-1 DME Agency:  [facility] NA  HH Arranged:  NA HH Agency:  NA  Status of Service:  Completed, signed off  If discussed at Long Length of Stay Meetings, dates discussed:    Additional Comments:  Durenda GuthrieBrady, Kashon Kraynak Naomi, RN 11/23/2015, 2:54 PM

## 2015-11-23 NOTE — Progress Notes (Signed)
Occupational Therapy Evaluation Patient Details Name: Gwendolyn Snyder MRN: [id number] DOB: 1950-07-11 Today's Date: 11/23/2015    History of Present Illness pt presents with L TKR Revision.  pt with hx of Bil THR, R TKR, R Carpal Tunnel Release, Glaucoma, COPD, Anxiety, HTN, DM, and Anemia.     Clinical Impression   Pt admitted with the above diagnoses and presents with below problem list. Pt will benefit from continued acute OT to address the below listed deficits and maximize independence with BADLs prior to d/c home. PTA pt was independent with ADLs. Pt currently min A with LB ADLs.      Follow Up Recommendations  Supervision/Assistance - 24 hour;No OT follow up    Equipment Recommendations  Tub/shower bench    Recommendations for Other Services       Precautions / Restrictions Precautions Precautions: Fall;Posterior Hip Precaution Booklet Issued: Yes (comment) Precaution Comments: reviewed posterior hip precautions Required Braces or Orthoses: Knee Immobilizer - Left Knee Immobilizer - Left: On at all times Restrictions Weight Bearing Restrictions: Yes LLE Weight Bearing: Weight bearing as tolerated      Mobility Bed Mobility Overal bed mobility: Needs Assistance Bed Mobility: Supine to Sit     Supine to sit: Min assist;HOB elevated     General bed mobility comments: up in chair  Transfers Overall transfer level: Needs assistance Equipment used: Rolling walker (2 wheeled) Transfers: Sit to/from Stand Sit to Stand: Min guard         General transfer comment: cues for technique. extra time needed. min guard for safety. from recliner and 3n1    Balance Overall balance assessment: Needs assistance Sitting-balance support: No upper extremity supported;Feet supported Sitting balance-Leahy Scale: Good     Standing balance support: Bilateral upper extremity supported;During functional activity Standing balance-Leahy Scale: Fair Standing balance comment:  stood to complete pericare min guard level                            ADL Overall ADL's : Needs assistance/impaired Eating/Feeding: Set up;Sitting   Grooming: Set up;Sitting;Standing   Upper Body Bathing: Set up;Sitting   Lower Body Bathing: Minimal assistance;Sit to/from stand   Upper Body Dressing : Set up;Sitting   Lower Body Dressing: Minimal assistance;Sit to/from stand   Toilet Transfer: Min guard;Ambulation;RW (3n1 over toilet)   Toileting- Clothing Manipulation and Hygiene: Min guard;Sit to/from stand Toileting - Clothing Manipulation Details (indicate cue type and reason): cued for precautions, discussed AE for pericare.     Functional mobility during ADLs: Min guard;Rolling walker General ADL Comments: Pt completed toilet transfer and pericare as detailed above. Educated on techniques and AE for LB ADLs with posterior hip precautions.      Vision     Perception     Praxis      Pertinent Vitals/Pain Pain Assessment: Faces Pain Score: 4  Faces Pain Scale: Hurts little more Pain Location: L hip Pain Descriptors / Indicators: Sore;Tightness Pain Intervention(s): Limited activity within patient's tolerance;Monitored during session;Repositioned     Hand Dominance     Extremity/Trunk Assessment Upper Extremity Assessment Upper Extremity Assessment: Overall WFL for tasks assessed   Lower Extremity Assessment Lower Extremity Assessment: Defer to PT evaluation LLE Deficits / Details: Sensation intact.  Order for Knee Immobilizer at all times, so not able to fully assess.  Hip generally weak post op. LLE: Unable to fully assess due to immobilization   Cervical / Trunk Assessment Cervical / Trunk Assessment:  Normal   Communication Communication Communication: HOH   Cognition Arousal/Alertness: Awake/alert Behavior During Therapy: WFL for tasks assessed/performed Overall Cognitive Status: Within Functional Limits for tasks assessed                      General Comments       Exercises       Shoulder Instructions      Home Living Family/patient expects to be discharged to:: Private residence Living Arrangements: Alone (pt to stay at brother's initially) Available Help at Discharge: Family;Available 24 hours/day Type of Home: House Home Access: Stairs to enter Enter[employer] CorporationEntrance Stairs-Number of Steps: 2 at brother's; 5 at her home Entrance Stairs-Rails: Right Home Layout: One level     Bathroom Shower/Tub: Tub/shower unit         Home Equipment: Environmental consultantWalker - 2 wheels;Adaptive equipment;Bedside commode (Lift chair) Adaptive Equipment: Reacher;Sock aid        Prior Functioning/Environment Level of Independence: Independent             OT Diagnosis: Acute pain   OT Problem List: Impaired balance (sitting and/or standing);Decreased knowledge of use of DME or AE;Decreased knowledge of precautions;Pain   OT Treatment/Interventions: Self-care/ADL training;DME and/or AE instruction;Therapeutic activities;Patient/family education;Balance training    OT Goals(Current goals can be found in the care plan section) Acute Rehab OT Goals Patient Stated Goal: Home OT Goal Formulation: With patient Time For Goal Achievement: 11/30/15 Potential to Achieve Goals: Good ADL Goals Pt Will Perform Lower Body Bathing: with modified independence;with adaptive equipment;sit to/from stand Pt Will Perform Lower Body Dressing: with modified independence;with adaptive equipment;sit to/from stand Pt Will Transfer to Toilet: with modified independence;ambulating (3n1 over toilet) Pt Will Perform Toileting - Clothing Manipulation and hygiene: with modified independence;sit to/from stand Pt Will Perform Tub/Shower Transfer: Tub transfer;with supervision;tub bench;rolling walker  OT Frequency: Min 2X/week   Barriers to D/C:            Co-evaluation              End of Session Equipment Utilized During Treatment: Rolling  walker;Left knee immobilizer  Activity Tolerance: Patient tolerated treatment well Patient left: in chair;with call bell/phone within reach;with family/visitor present   Time: 9562-13081109-1125 OT Time Calculation (min): 16 min Charges:  OT General Charges $OT Visit: 1 Procedure OT Evaluation $OT Eval Low Complexity: 1 Procedure G-Codes:    Pilar GrammesMathews, Bellina Tokarczyk H 11/23/2015, 11:39 AM

## 2015-11-24 LAB — BASIC METABOLIC PANEL
Anion gap: 7 (ref 5–15)
Anion gap: 8 (ref 5–15)
BUN: 16 mg/dL (ref 6–20)
BUN: 17 mg/dL (ref 6–20)
CO2: 23 mmol/L (ref 22–32)
CO2: 24 mmol/L (ref 22–32)
Calcium: 8.5 mg/dL — ABNORMAL LOW (ref 8.9–10.3)
Calcium: 8.8 mg/dL — ABNORMAL LOW (ref 8.9–10.3)
Chloride: 110 mmol/L (ref 101–111)
Chloride: 108 mmol/L (ref 101–111)
Creatinine, Ser: 1.19 mg/dL — ABNORMAL HIGH (ref 0.44–1.00)
Creatinine, Ser: 1.14 mg/dL — ABNORMAL HIGH (ref 0.44–1.00)
GFR calc Af Amer: 55 mL/min — ABNORMAL LOW (ref >60)
GFR calc Af Amer: 58 mL/min — ABNORMAL LOW (ref >60)
GFR calc non Af Amer: 47 mL/min — ABNORMAL LOW (ref >60)
GFR calc non Af Amer: 50 mL/min — ABNORMAL LOW (ref >60)
Glucose, Bld: 109 mg/dL — ABNORMAL HIGH (ref 65–99)
Glucose, Bld: 119 mg/dL — ABNORMAL HIGH (ref 65–99)
Potassium: 2.7 mmol/L — CL (ref 3.5–5.1)
Potassium: 3 mmol/L — ABNORMAL LOW (ref 3.5–5.1)
Sodium: 140 mmol/L (ref 135–145)
Sodium: 140 mmol/L (ref 135–145)

## 2015-11-24 LAB — CBC
HCT: 26.8 % — ABNORMAL LOW (ref 36.0–46.0)
Hemoglobin: 8.2 g/dL — ABNORMAL LOW (ref 12.0–15.0)
MCH: 24.6 pg — ABNORMAL LOW (ref 26.0–34.0)
MCHC: 30.6 g/dL (ref 30.0–36.0)
MCV: 80.5 fL (ref 78.0–100.0)
Platelets: 217 10*3/uL (ref 150–400)
RBC: 3.33 MIL/uL — ABNORMAL LOW (ref 3.87–5.11)
RDW: 18.6 % — ABNORMAL HIGH (ref 11.5–15.5)
WBC: 8.1 10*3/uL (ref 4.0–10.5)

## 2015-11-24 LAB — GLUCOSE, CAPILLARY
Glucose-Capillary: 166 mg/dL — ABNORMAL HIGH (ref 65–99)
Glucose-Capillary: 120 mg/dL — ABNORMAL HIGH (ref 65–99)

## 2015-11-24 MED ORDER — OXYCODONE-ACETAMINOPHEN 5-325 MG PO TABS: 1.0000 | ORAL_TABLET | ORAL | Status: DC | PRN

## 2015-11-24 MED ORDER — ASPIRIN 325 MG PO TABS: 325.0000 mg | ORAL_TABLET | Freq: Every day | ORAL | Status: DC

## 2015-11-24 MED ORDER — POTASSIUM CHLORIDE CRYS ER 20 MEQ PO TBCR
40.0000 meq | EXTENDED_RELEASE_TABLET | Freq: Two times a day (BID) | ORAL | Status: DC
  Administered 2015-11-24: 40 meq via ORAL
  Filled 2015-11-24: qty 2

## 2015-11-24 NOTE — Discharge Instructions (Addendum)
Keep knee immobilizer on snug except for shower or for leg excercises.  Walk daily. See dr. Ophelia Charter in about 2 wks.   INSTRUCTIONS AFTER JOINT REPLACEMENT   o Remove items at home which could result in a fall. This includes throw rugs or furniture in walking pathways o ICE to the affected joint every three hours while awake for 30 minutes at a time, for at least the first 3-5 days, and then as needed for pain and swelling.  Continue to use ice for pain and swelling. You may notice swelling that will progress down to the foot and ankle.  This is normal after surgery.  Elevate your leg when you are not up walking on it.   o Continue to use the breathing machine you got in the hospital (incentive spirometer) which will help keep your temperature down.  It is common for your temperature to cycle up and down following surgery, especially at night when you are not up moving around and exerting yourself.  The breathing machine keeps your lungs expanded and your temperature down.   DIET:  As you were doing prior to hospitalization, we recommend a well-balanced diet.  DRESSING / WOUND CARE / SHOWERING  You may change your dressing 3-5 days after surgery.  Then change the dressing every day with sterile gauze.  Please use good hand washing techniques before changing the dressing.  Do not use any lotions or creams on the incision until instructed by your surgeon.  ACTIVITY  o Increase activity slowly as tolerated, but follow the weight bearing instructions below.   o No driving for 6 weeks or until further direction given by your physician.  You cannot drive while taking narcotics.  o No lifting or carrying greater than 10 lbs. until further directed by your surgeon. o Avoid periods of inactivity such as sitting longer than an hour when not asleep. This helps prevent blood clots.  o You may return to work once you are authorized by your doctor.     WEIGHT BEARING   Weight bearing as tolerated with  assist device (walker, cane, etc) as directed, use it as long as suggested by your surgeon or therapist, typically at least 4-6 weeks.   EXERCISES  Results after joint replacement surgery are often greatly improved when you follow the exercise, range of motion and muscle strengthening exercises prescribed by your doctor. Safety measures are also important to protect the joint from further injury. Any time any of these exercises cause you to have increased pain or swelling, decrease what you are doing until you are comfortable again and then slowly increase them. If you have problems or questions, call your caregiver or physical therapist for advice.   Rehabilitation is important following a joint replacement. After just a few days of immobilization, the muscles of the leg can become weakened and shrink (atrophy).  These exercises are designed to build up the tone and strength of the thigh and leg muscles and to improve motion. Often times heat used for twenty to thirty minutes before working out will loosen up your tissues and help with improving the range of motion but do not use heat for the first two weeks following surgery (sometimes heat can increase post-operative swelling).   These exercises can be done on a training (exercise) mat, on the floor, on a table or on a bed. Use whatever works the best and is most comfortable for you.    Use music or television while you are exercising  so that the exercises are a pleasant break in your day. This will make your life better with the exercises acting as a break in your routine that you can look forward to.   Perform all exercises about fifteen times, three times per day or as directed.  You should exercise both the operative leg and the other leg as well.  Exercises include:    Quad Sets - Tighten up the muscle on the front of the thigh (Quad) and hold for 5-10 seconds.    Straight Leg Raises - With your knee straight (if you were given a brace, keep  it on), lift the leg to 60 degrees, hold for 3 seconds, and slowly lower the leg.  Perform this exercise against resistance later as your leg gets stronger.   Leg Slides: Lying on your back, slowly slide your foot toward your buttocks, bending your knee up off the floor (only go as far as is comfortable). Then slowly slide your foot back down until your leg is flat on the floor again.   Angel Wings: Lying on your back spread your legs to the side as far apart as you can without causing discomfort.   Hamstring Strength:  Lying on your back, push your heel against the floor with your leg straight by tightening up the muscles of your buttocks.  Repeat, but this time bend your knee to a comfortable angle, and push your heel against the floor.  You may put a pillow under the heel to make it more comfortable if necessary.   A rehabilitation program following joint replacement surgery can speed recovery and prevent re-injury in the future due to weakened muscles. Contact your doctor or a physical therapist for more information on knee rehabilitation.    CONSTIPATION  Constipation is defined medically as fewer than three stools per week and severe constipation as less than one stool per week.  Even if you have a regular bowel pattern at home, your normal regimen is likely to be disrupted due to multiple reasons following surgery.  Combination of anesthesia, postoperative narcotics, change in appetite and fluid intake all can affect your bowels.   YOU MUST use at least one of the following options; they are listed in order of increasing strength to get the job done.  They are all available over the counter, and you may need to use some, POSSIBLY even all of these options:    Drink plenty of fluids (prune juice may be helpful) and high fiber foods Colace 100 mg by mouth twice a day  Senokot for constipation as directed and as needed Dulcolax (bisacodyl), take with full glass of water  Miralax (polyethylene  glycol) once or twice a day as needed.  If you have tried all these things and are unable to have a bowel movement in the first 3-4 days after surgery call either your surgeon or your primary doctor.    If you experience loose stools or diarrhea, hold the medications until you stool forms back up.  If your symptoms do not get better within 1 week or if they get worse, check with your doctor.  If you experience "the worst abdominal pain ever" or develop nausea or vomiting, please contact the office immediately for further recommendations for treatment.   ITCHING:  If you experience itching with your medications, try taking only a single pain pill, or even half a pain pill at a time.  You can also use Benadryl over the counter for itching or  also to help with sleep.   TED HOSE STOCKINGS:  Use stockings on both legs until for at least 2 weeks or as directed by physician office. They may be removed at night for sleeping.  MEDICATIONS:  See your medication summary on the After Visit Summary that nursing will review with you.  You may have some home medications which will be placed on hold until you complete the course of blood thinner medication.  It is important for you to complete the blood thinner medication as prescribed.  PRECAUTIONS:  If you experience chest pain or shortness of breath - call 911 immediately for transfer to the hospital emergency department.   If you develop a fever greater that 101 F, purulent drainage from wound, increased redness or drainage from wound, foul odor from the wound/dressing, or calf pain - CONTACT YOUR SURGEON.                                                   FOLLOW-UP APPOINTMENTS:  If you do not already have a post-op appointment, please call the office for an appointment to be seen by your surgeon.  Guidelines for how soon to be seen are listed in your After Visit Summary, but are typically between 1-4 weeks after surgery.  OTHER INSTRUCTIONS:   Knee  Replacement:  Do not place pillow under knee, focus on keeping the knee straight while resting. CPM instructions: 0-90 degrees, 2 hours in the morning, 2 hours in the afternoon, and 2 hours in the evening. Place foam block, curve side up under heel at all times except when in CPM or when walking.  DO NOT modify, tear, cut, or change the foam block in any way.  MAKE SURE YOU:   Understand these instructions.   Get help right away if you are not doing well or get worse.    Thank you for letting us be a part of your medical care team.  It is a privilege we respect greatly.  We hope these instructions will help you stay on track for a fast and full recovery!

## 2015-11-24 NOTE — Progress Notes (Signed)
Subjective: 2 Days Post-Op Procedure(s) (LRB): Revision Left Total Hip Arthroplasty, Poly and Ball Exchange (Left) Patient reports pain as mild.    Objective: Vital signs in last 24 hours: Temp:  [98.5 F (36.9 C)-100.4 F (38 C)] 98.5 F (36.9 C) (07/14 0932) Pulse Rate:  [77-93] 82 (07/14 0932) Resp:  [18-19] 18 (07/14 0932) BP: (124-145)/(61-66) 136/61 mmHg (07/14 0932) SpO2:  [94 %-100 %] 100 % (07/14 0932)  Intake/Output from previous day: 07/13 0701 - 07/14 0700 In: 960 [P.O.:960] Out: -  Intake/Output this shift: Total I/O In: 120 [P.O.:120] Out: -    Recent Labs  11/23/15 0708 11/24/15 0317  HGB 9.0* 8.2*    Recent Labs  11/23/15 0708 11/24/15 0317  WBC 9.5 8.1  RBC 3.79* 3.33*  HCT 30.3* 26.8*  PLT 262 217    Recent Labs  11/23/15 0708 11/24/15 0317  NA 140 140  K 2.8* 2.7*  CL 110 110  CO2 22 23  BUN 14 16  CREATININE 1.01* 1.19*  GLUCOSE 102* 109*  CALCIUM 8.6* 8.5*   No results for input(s): LABPT, INR in the last 72 hours.  Neurologically intact  Assessment/Plan: 2 Days Post-Op Procedure(s) (LRB): Revision Left Total Hip Arthroplasty, Poly and Ball Exchange (Left) Up with therapy already. Discharge home, office two weeks.   Kilea Mccarey C 11/24/2015, 10:26 AM

## 2015-11-24 NOTE — Progress Notes (Signed)
Discharge: patient is discharged to home via wheelchair, her sister is with her, discharge instruction given to patient , she verbalized understanding of instructions, no c/o pain or discomfort, no acute distress noted

## 2015-11-24 NOTE — Progress Notes (Signed)
Physical Therapy Treatment Patient Details Name: Gwendolyn Snyder MRN: [id number] DOB: 04/28/1951 Today's Date: 11/24/2015    History of Present Illness pt presents with L TKR Revision.  pt with hx of Bil THR, R TKR, R Carpal Tunnel Release, Glaucoma, COPD, Anxiety, HTN, DM, and Anemia.      PT Comments    Pt moving well and not requiring A for any mobility.  Feel pt is ready for D/C from PT perspective.    Follow Up Recommendations  Home health PT;Supervision/Assistance - 24 hour     Equipment Recommendations  None recommended by PT    Recommendations for Other Services       Precautions / Restrictions Precautions Precautions: Fall;Posterior Hip Precaution Comments: reviewed posterior hip precautions Required Braces or Orthoses: Knee Immobilizer - Left Knee Immobilizer - Left: On at all times Restrictions Weight Bearing Restrictions: Yes LLE Weight Bearing: Weight bearing as tolerated    Mobility  Bed Mobility               General bed mobility comments: up in chair  Transfers Overall transfer level: Needs assistance Equipment used: Rolling walker (2 wheeled) Transfers: Sit to/from Stand Sit to Stand: Supervision         General transfer comment: No cues needed.    Ambulation/Gait Ambulation/Gait assistance: Supervision Ambulation Distance (Feet): 250 Feet Assistive device: Rolling walker (2 wheeled) Gait Pattern/deviations: Step-through pattern     General Gait Details: pt moving well with step-through gait and more upright posture than yesterday.     Stairs Stairs: Yes Stairs assistance: Supervision Stair Management: Two rails;Step to pattern;Forwards Number of Stairs: 5 General stair comments: pt able to perform stairs with only needing cueing for sequencing gait on stairs.    Wheelchair Mobility    Modified Rankin (Stroke Patients Only)       Balance Overall balance assessment: Needs assistance Sitting-balance support: No upper  extremity supported;Feet supported Sitting balance-Leahy Scale: Good     Standing balance support: Single extremity supported;Bilateral upper extremity supported;During functional activity Standing balance-Leahy Scale: Fair                      Cognition Arousal/Alertness: Awake/alert Behavior During Therapy: WFL for tasks assessed/performed Overall Cognitive Status: Within Functional Limits for tasks assessed                      Exercises      General Comments        Pertinent Vitals/Pain Pain Assessment: 0-10 Pain Score: 2  Pain Location: L hip Pain Descriptors / Indicators: Tightness Pain Intervention(s): Monitored during session;Premedicated before session;Repositioned    Home Living                      Prior Function            PT Goals (current goals can now be found in the care plan section) Acute Rehab PT Goals Patient Stated Goal: Home PT Goal Formulation: With patient Time For Goal Achievement: 11/30/15 Potential to Achieve Goals: Good Progress towards PT goals: Progressing toward goals    Frequency  7X/week    PT Plan Current plan remains appropriate    Co-evaluation             End of Session Equipment Utilized During Treatment: Gait belt;Left knee immobilizer Activity Tolerance: Patient tolerated treatment well Patient left: in chair;with call bell/phone within reach     Time: 0865-7846 PT  Time Calculation (min) (ACUTE ONLY): 24 min  Charges:  $Gait Training: 23-37 mins                    G CodesSunny Snyder:      Gwendolyn Snyder, [place] [phone] 11/24/2015, 10:12 AM

## 2015-11-24 NOTE — Care Management (Signed)
Pt discharging home with self care. PT recommending HH services. CM spoke with Dr Ophelia CharterYates and currently he does not want any HH services for the patient. Pt has 3 in 1 in the room and transportation home. No further needs per CM.

## 2015-11-24 NOTE — Progress Notes (Signed)
CRITICAL VALUE ALERT  Critical value received:  Potassium 2.7   Date of notification:  11/24/15  Time of notification:  0630  Critical value read back:Yes.     Nurse who received alert:  Laney PastorG. Sakyi  MD notified (1st page):  Dr Magnus IvanBlackman  Time of first page:  [phone]  MD notified (2nd page): No  Time of second page: N/A  Responding MD:  Dr Ophelia CharterYates or Dr Magnus IvanBlackman  Time MD responded:  Awaiting for MD

## 2015-11-24 NOTE — Progress Notes (Signed)
Pt stated the MD told her she was ready for go today. Called Dr. Ophelia CharterYates to clarify since pt's potassium was 2.7 this AM. Received verbal confirmation that Dr. Ophelia CharterYates is comfortable with discharging pt home on oral supplements. Will continue to monitor.

## 2015-11-27 LAB — AEROBIC/ANAEROBIC CULTURE W GRAM STAIN (SURGICAL/DEEP WOUND)

## 2015-11-27 LAB — AEROBIC/ANAEROBIC CULTURE (SURGICAL/DEEP WOUND)
Culture: NO GROWTH
Gram Stain: NONE SEEN

## 2015-12-05 DIAGNOSIS — T84031D Mechanical loosening of internal left hip prosthetic joint, subsequent encounter: Secondary | ICD-10-CM | POA: Diagnosis not present

## 2015-12-14 NOTE — Discharge Summary (Signed)
Patient ID: Gwendolyn Snyder MRN: [id number] DOB/AGE: 1950-08-07 65 y.o.  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 Anemia   . Anxiety   . Arthritis   . Cataracts, bilateral   . Complication of anesthesia    once after surgery felt like she could not breathe- 11/09/15- surgery greater than 11 years ago  . COPD (chronic obstructive pulmonary disease) (HCC)   . Diabetes mellitus without complication (HCC)    diet controlled , A1C done in April 6.2  . Family history of adverse reaction to anesthesia    sister- confused  . Glaucoma   . History of hiatal hernia   . Hypertension   . Shortness of breath dyspnea    with exertion    Surgeries: Procedure(s): Revision Left Total Hip Arthroplasty, Poly and Ball Exchange on 11/22/2015   Consultants:   Discharged Condition: Improved  Hospital Course: Gwendolyn Snyder is an 65 y.o. female who was admitted 11/22/2015 for operative treatment of THA poly wear. Patient failed conservative treatments (please see the history and physical for the specifics) and had severe unremitting pain that affects sleep, daily activities and work/hobbies. After pre-op clearance, the patient was taken to the operating room on 11/22/2015 and underwent  Procedure(s): Revision Left Total Hip Arthroplasty, Poly and [employer].    Patient was given perioperative antibiotics:  Anti-infectives    Start     Dose/Rate Route Frequency Ordered Stop   11/22/15 2000  ceFAZolin (ANCEF) IVPB 1 g/50 mL premix     1 g 100 mL/hr over  30 Minutes Intravenous Every 8 hours 11/22/15 1648 11/23/15 0410   11/22/15 1230  ceFAZolin (ANCEF) IVPB 2g/100 mL premix     2 g 200 mL/hr over 30 Minutes Intravenous To ShortStay Surgical 11/21/15 1023 11/22/15 1335       Patient was given sequential compression devices and early ambulation to prevent DVT.   Patient benefited maximally from hospital stay and there were no complications. At the time of discharge, the patient was urinating/moving their bowels without difficulty, tolerating a regular diet, pain is controlled with oral pain medications and they have been cleared by PT/OT.   Recent vital signs: No data found.    Recent laboratory studies: No results for input(s): WBC, HGB, HCT, PLT, NA, K, CL, CO2, BUN, CREATININE, GLUCOSE, INR, CALCIUM in the last 72 hours.  Invalid input(s): PT, 2   Discharge Medications:     Medication List    TAKE these medications   acetaminophen 650 MG CR tablet Commonly known as:  TYLENOL Take 1,300 mg by mouth 2 (two) times daily.   amLODipine 5 MG tablet Commonly known as:  NORVASC Take 1 tablet by mouth daily.   aspirin 325 MG tablet Commonly known as:  BAYER ASPIRIN Take 1 tablet (325 mg total) by mouth daily.   cholecalciferol 1000 units tablet Commonly known as:  VITAMIN D Take 1,000 Units by mouth daily.   ferrous sulfate 325 (65 FE) MG tablet Take 325 mg by mouth 3 (three) times a week. Mon Wed Fri   latanoprost 0.005 % ophthalmic solution Commonly known  as:  XALATAN Place 1 drop into both eyes at bedtime.   lisinopril 20 MG tablet Commonly known as:  PRINIVIL,ZESTRIL Take 1 tablet by mouth 2 (two) times daily.   NIACIN FLUSH FREE 500 MG Caps Generic drug:  Inositol Niacinate Take 1 capsule by mouth 2 (two) times daily.   oxyCODONE-acetaminophen 5-325 MG tablet Commonly known as:  ROXICET Take 1-2 tablets by mouth every 4 (four) hours as needed for severe pain.   potassium chloride SA 20 MEQ tablet Commonly  known as:  K-DUR,KLOR-CON Take 1 tablet by mouth daily.       Diagnostic Studies: Dg Pelvis Portable  Result Date: 11/22/2015 CLINICAL DATA:  Revision left hip arthroplasty EXAM: PORTABLE PELVIS 1-2 VIEWS COMPARISON:  None. FINDINGS: There is interval revision of a left total hip arthroplasty without failure complication. Postsurgical changes in the surrounding soft tissues. There is a right total hip arthroplasty without failure complication. 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 Electronically Signed   By: Elige Ko   On: 11/22/2015 16:05      Follow-up Information    Eldred Manges, MD In 2 weeks.   Specialty:  Orthopedic Surgery Contact information: [address] [place] [address] [place] [address] [phone]           Discharge Plan:  discharge to home  Disposition:     Signed: Naida Sleight  12/14/2015, 10:16 AM

## 2016-01-17 DIAGNOSIS — H348122 Central retinal vein occlusion, left eye, stable: Secondary | ICD-10-CM | POA: Diagnosis not present

## 2016-02-22 DIAGNOSIS — E1165 Type 2 diabetes mellitus with hyperglycemia: Secondary | ICD-10-CM | POA: Diagnosis not present

## 2016-02-22 DIAGNOSIS — I10 Essential (primary) hypertension: Secondary | ICD-10-CM | POA: Diagnosis not present

## 2016-02-22 DIAGNOSIS — E1129 Type 2 diabetes mellitus with other diabetic kidney complication: Secondary | ICD-10-CM | POA: Diagnosis not present

## 2016-02-22 DIAGNOSIS — Z681 Body mass index (BMI) 19 or less, adult: Secondary | ICD-10-CM | POA: Diagnosis not present

## 2016-02-22 DIAGNOSIS — E119 Type 2 diabetes mellitus without complications: Secondary | ICD-10-CM | POA: Diagnosis not present

## 2016-02-22 DIAGNOSIS — E782 Mixed hyperlipidemia: Secondary | ICD-10-CM | POA: Diagnosis not present

## 2016-02-22 DIAGNOSIS — Z1389 Encounter for screening for other disorder: Secondary | ICD-10-CM | POA: Diagnosis not present

## 2016-05-10 DIAGNOSIS — H401111 Primary open-angle glaucoma, right eye, mild stage: Secondary | ICD-10-CM | POA: Diagnosis not present

## 2016-07-17 DIAGNOSIS — H348122 Central retinal vein occlusion, left eye, stable: Secondary | ICD-10-CM | POA: Diagnosis not present

## 2016-07-17 DIAGNOSIS — E119 Type 2 diabetes mellitus without complications: Secondary | ICD-10-CM | POA: Diagnosis not present

## 2016-08-08 DIAGNOSIS — R310 Gross hematuria: Secondary | ICD-10-CM | POA: Diagnosis not present

## 2016-08-08 DIAGNOSIS — R3129 Other microscopic hematuria: Secondary | ICD-10-CM | POA: Diagnosis not present

## 2016-08-08 DIAGNOSIS — N200 Calculus of kidney: Secondary | ICD-10-CM | POA: Diagnosis not present

## 2016-08-13 ENCOUNTER — Other Ambulatory Visit: Payer: Self-pay | Admitting: Urology

## 2016-08-13 DIAGNOSIS — N200 Calculus of kidney: Secondary | ICD-10-CM

## 2016-08-13 NOTE — Patient Instructions (Signed)
Gwendolyn Snyder  08/13/2016   Your procedure is scheduled on: 08/20/2016    Report to [facility] Main  Entrance.  Check in at Radiology first on first floor then  take [facility]  elevators to 3rd floor to  Short Stay Center at   Call this number if you have problems the morning of surgery [phone] 0730am.    Remember: ONLY 1 PERSON MAY GO WITH YOU TO SHORT STAY TO GET  READY MORNING OF YOUR SURGERY.  Do not eat food or drink liquids :After Midnight.     Take these medicines the morning of surgery with A SIP OF WATER: Amlodipine ( Norvasc)                                You may not have any metal on your body including hair pins and              piercings  Do not wear jewelry, make-up, lotions, powders or perfumes, deodorant             Do not wear nail polish.  Do not shave  48 hours prior to surgery.     Do not bring valuables to the hospital. [facility] IS NOT             RESPONSIBLE   FOR VALUABLES.  Contacts, dentures or bridgework may not be worn into surgery.  Leave suitcase in the car. After surgery it may be brought to your room.       Special Instructions: N/A              Please read over the following fact sheets you were given: _____________________________________________________________________             [facility] - Preparing for Surgery Before surgery, you can play an important role.  Because skin is not sterile, your skin needs to be as free of germs as possible.  You can reduce the number of germs on your skin by washing with CHG (chlorahexidine gluconate) soap before surgery.  CHG is an antiseptic cleaner which kills germs and bonds with the skin to continue killing germs even after washing. Please DO NOT use if you have an allergy to CHG or antibacterial soaps.  If your skin becomes reddened/irritated stop using the CHG and inform your nurse when you arrive at Short Stay. Do not shave (including legs and underarms) for at least 48  hours prior to the first CHG shower.  You may shave your face/neck. Please follow these instructions carefully:  1.  Shower with CHG Soap the night before surgery and the  morning of Surgery.  2.  If you choose to wash your hair, wash your hair first as usual with your  normal  shampoo.  3.  After you shampoo, rinse your hair and body thoroughly to remove the  shampoo.                           4.  Use CHG as you would any other liquid soap.  You can apply chg directly  to the skin and wash                       Gently with a scrungie or clean washcloth.  5.  Apply the CHG Soap to your body ONLY FROM THE NECK DOWN.   Do not use on face/ open                           Wound or open sores. Avoid contact with eyes, ears mouth and genitals (private parts).                       Wash face,  Genitals (private parts) with your normal soap.             6.  Wash thoroughly, paying special attention to the area where your surgery  will be performed.  7.  Thoroughly rinse your body with warm water from the neck down.  8.  DO NOT shower/wash with your normal soap after using and rinsing off  the CHG Soap.                9.  Pat yourself dry with a clean towel.            10.  Wear clean pajamas.            11.  Place clean sheets on your bed the night of your first shower and do not  sleep with pets. Day of Surgery : Do not apply any lotions/deodorants the morning of surgery.  Please wear clean clothes to the hospital/surgery center.  FAILURE TO FOLLOW THESE INSTRUCTIONS MAY RESULT IN THE CANCELLATION OF YOUR SURGERY PATIENT SIGNATURE_________________________________  NURSE SIGNATURE__________________________________  ________________________________________________________________________

## 2016-08-14 ENCOUNTER — Encounter (HOSPITAL_COMMUNITY): Payer: Self-pay

## 2016-08-14 ENCOUNTER — Other Ambulatory Visit: Payer: Self-pay | Admitting: Urology

## 2016-08-14 ENCOUNTER — Encounter (HOSPITAL_COMMUNITY)
Admission: RE | Admit: 2016-08-14 | Discharge: 2016-08-14 | Disposition: A | Discharge status: Home / Self Care | Payer: PPO | Source: Ambulatory Visit | Attending: Urology | Admitting: Urology

## 2016-08-14 DIAGNOSIS — Z01818 Encounter for other preprocedural examination: Secondary | ICD-10-CM | POA: Diagnosis not present

## 2016-08-14 DIAGNOSIS — E119 Type 2 diabetes mellitus without complications: Secondary | ICD-10-CM | POA: Insufficient documentation

## 2016-08-14 HISTORY — DX: Carpal tunnel syndrome, bilateral upper limbs: G56.03

## 2016-08-14 HISTORY — DX: Personal history of urinary calculi: Z87.442

## 2016-08-14 LAB — CBC
HCT: 34.6 % — ABNORMAL LOW (ref 36.0–46.0)
Hemoglobin: 11 g/dL — ABNORMAL LOW (ref 12.0–15.0)
MCH: 26.6 pg (ref 26.0–34.0)
MCHC: 31.8 g/dL (ref 30.0–36.0)
MCV: 83.8 fL (ref 78.0–100.0)
Platelets: 295 10*3/uL (ref 150–400)
RBC: 4.13 MIL/uL (ref 3.87–5.11)
RDW: 13.6 % (ref 11.5–15.5)
WBC: 8.4 10*3/uL (ref 4.0–10.5)

## 2016-08-14 LAB — BASIC METABOLIC PANEL WITH GFR
Anion gap: 10 (ref 5–15)
BUN: 16 mg/dL (ref 6–20)
CO2: 24 mmol/L (ref 22–32)
Calcium: 9.4 mg/dL (ref 8.9–10.3)
Chloride: 109 mmol/L (ref 101–111)
Creatinine, Ser: 1.01 mg/dL — ABNORMAL HIGH (ref 0.44–1.00)
GFR calc Af Amer: >60 mL/min
GFR calc non Af Amer: 57 mL/min — ABNORMAL LOW
Glucose, Bld: 117 mg/dL — ABNORMAL HIGH (ref 65–99)
Potassium: 3.4 mmol/L — ABNORMAL LOW (ref 3.5–5.1)
Sodium: 143 mmol/L (ref 135–145)

## 2016-08-14 LAB — ABO/RH: ABO/RH(D): O POS

## 2016-08-14 LAB — GLUCOSE, CAPILLARY: Glucose-Capillary: 111 mg/dL — ABNORMAL HIGH (ref 65–99)

## 2016-08-14 NOTE — Progress Notes (Signed)
EKG-10/02/15-EPIC ECHO-10/18/15- EPIC  10/02/15-CXR-epic  10/17/2015- LOV- cardiology - epic

## 2016-08-14 NOTE — Progress Notes (Signed)
Temp 99.0 at preop appointment of 08/14/2016.   FYI.

## 2016-08-15 LAB — HEMOGLOBIN A1C
Hgb A1c MFr Bld: 6.7 % — ABNORMAL HIGH (ref 4.8–5.6)
Mean Plasma Glucose: 146 mg/dL

## 2016-08-19 ENCOUNTER — Other Ambulatory Visit: Payer: Self-pay | Admitting: Radiology

## 2016-08-19 NOTE — H&P (Signed)
CC: I have blood in my urine.  HPI: Gwendolyn Snyder is a 66 year-old female patient who is here for blood in the urine.  She did see the blood in her urine. She first noticed the symptoms 08/04/2016. She has seen blood clots.   She does not have a burning sensation when she urinates. She is not currently having trouble urinating.   She is not having pain. She has not recently had unwanted weight loss.   Mr. Hartley is a former patient of Dr. Vonita Moss with a history of stones. She had a left nephrolithotomy remotely. She presents now with the onset /29/2018 08:44 AM  Weight 187 lb / 84.82 kg  Height 63 in / 160.02 cm  BP 161/82 mmHg  Pulse 73 /min  Temperature 98.0 F / 37 C  BMI 33.1 kg/m   MULTI-SYSTEM PHYSICAL EXAMINATION:  Constitutional: Well-nourished. No physical deformities. Normally developed. Good grooming.  Neck: Neck symmetrical, not swollen. Normal tracheal position.  Respiratory: No labored breathing, no use of accessory muscles. CTA  Cardiovascular: Normal temperature, RRR without murmur. No edema.   Lymphatic: No enlargement, no tenderness of axillae, supraclavicular, neck lymph nodes.  Skin: No paleness, no jaundice, no cyanosis. No lesion, no ulcer, no rash.   Neurologic / Psychiatric: Oriented to time, oriented to place, oriented to person. No depression, no anxiety, no agitation.  Gastrointestinal: Abdominal tenderness mild RUQ, obese. No mass, no rigidity.   Musculoskeletal: Normal gait and station of head and neck.     PAST DATA REVIEWED:  Source Of History:  Patient  Lab Test Review:   CMP  Records Review:   Previous Patient Records  Urine Test Review:   Urinalysis  X-Ray Review: C.T. Hematuria: Reviewed Films. Discussed With Patient.    Notes:                     I have reviewed her prior office notes. Her Cr was 1.14 in 7/17. Records in EPIC reviewed.    PROCEDURES:         C.T. 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 No obvious bladder lesions noted but she has bilateral THA's.       IOSVUE 300 100CC          Urinalysis w/Scope Dipstick Dipstick Cont'd Micro  Color: Red Bilirubin: Neg WBC/hpf: 0 - 5/hpf  Appearance: Turbid Ketones: Trace RBC/hpf: >60/hpf  Specific Gravity: 1.020 Blood: 3+ Bacteria: Few (10-25/hpf)  pH: 5.5 Protein: 3+ Cystals: NS (Not Seen)  Glucose: Neg Urobilinogen: 0.2 Casts: NS (Not Seen)    Nitrites: Neg Trichomonas: Not Present    Leukocyte Esterase: 2+ Mucous: Not Present      Epithelial Cells: 0 - 5/hpf      Yeast: NS (Not Seen)      Sperm: Not Present    Notes: unconcentrated microscopic          Morphine  - J2270, 60454 Qty: 4 Adm. By: Tawnya Crook  Unit: mg Lot No [id number]  Route: IM Exp. Date 12/10/2017  Freq: None Mfgr.:   Site: Right Hip   ASSESSMENT:      ICD-10 Details  1 GU:   Gross hematuria - R31.0 The hematuria appears to be from the stone and she had colic post CT that could be from clots. She required a morphine injection post CT and will be sent home with oxycodone.   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 There is no obstruction. I am going  to get her set up for left PCNL and will request upper and lower pole access. She will likely need 2-3 procedures to be rendered stone free. I have reviewed the risks of bleeding, infection, injury to the kidney or adjacent organs, urine leaks, need for multiple procedures, thrombotic events and anesthetic complications. Urine culture will be sent today.    PLAN:            Medications New Meds: Oxycodone-Acetaminophen 5 mg-325 mg tablet 1 tablet PO Q 6 H PRN prn pain  #15  0 Refill(s)            Orders Labs Urine Culture, BUN/Creatinine(Stat)  X-Rays: C.T. Hematuria With and Without I.V. Contrast - She has active  gross hematuria and I need the STAT CT prior to cystoscopy today.   X-Ray Notes: History:  Hematuria: Yes/No  Patient to see MD after exam: Yes/No  Previous exam: CT / IVP/ US/ KUB/ None  When:  Where:  Diabetic: Yes/ No  BUN/ Creatinine:  Date of last BUN Creatinine:  Weight in pounds:  Allergy- IV Contrast: Yes/ No  Conflicting diabetic meds: Yes/ No  Diabetic Meds:  Prior Authorization #: [id number]           Schedule Return Visit/Planned Activity: Next Available Appointment - Schedule Surgery  Procedure: 08/08/2016 at [facility] - [phone] - Morphine  (Ther/Proph/Diag Inj, [place]/Im) - [id number], U9811          Document Letter(s):  Created for Patient: Clinical Summary

## 2016-08-20 ENCOUNTER — Observation Stay (HOSPITAL_COMMUNITY): Payer: PPO

## 2016-08-20 ENCOUNTER — Encounter (HOSPITAL_COMMUNITY): Payer: Self-pay | Admitting: *Deleted

## 2016-08-20 ENCOUNTER — Ambulatory Visit (HOSPITAL_COMMUNITY): Payer: PPO | Admitting: Anesthesiology

## 2016-08-20 ENCOUNTER — Ambulatory Visit (HOSPITAL_COMMUNITY)
Admission: RE | Admit: 2016-08-20 | Discharge: 2016-08-20 | Disposition: A | Discharge status: Home / Self Care | Payer: PPO | Source: Ambulatory Visit | Attending: Urology | Admitting: Urology

## 2016-08-20 ENCOUNTER — Ambulatory Visit (HOSPITAL_COMMUNITY): Admission: RE | Admit: 2016-08-20 | Payer: PPO | Source: Ambulatory Visit | Admitting: Urology

## 2016-08-20 ENCOUNTER — Inpatient Hospital Stay (HOSPITAL_COMMUNITY)
Admission: RE | Admit: 2016-08-20 | Discharge: 2016-08-23 | DRG: 655 | Disposition: A | Discharge status: Home / Self Care | Payer: PPO | Source: Ambulatory Visit | Attending: Urology | Admitting: Urology

## 2016-08-20 ENCOUNTER — Encounter (HOSPITAL_COMMUNITY)
Admission: RE | Disposition: A | Discharge status: Home / Self Care | Payer: Self-pay | Source: Ambulatory Visit | Attending: Urology

## 2016-08-20 DIAGNOSIS — J449 Chronic obstructive pulmonary disease, unspecified: Secondary | ICD-10-CM | POA: Diagnosis not present

## 2016-08-20 DIAGNOSIS — H269 Unspecified cataract: Secondary | ICD-10-CM | POA: Diagnosis not present

## 2016-08-20 DIAGNOSIS — F419 Anxiety disorder, unspecified: Secondary | ICD-10-CM | POA: Diagnosis not present

## 2016-08-20 DIAGNOSIS — Z96643 Presence of artificial hip joint, bilateral: Secondary | ICD-10-CM | POA: Diagnosis not present

## 2016-08-20 DIAGNOSIS — N209 Urinary calculus, unspecified: Secondary | ICD-10-CM | POA: Diagnosis not present

## 2016-08-20 DIAGNOSIS — N200 Calculus of kidney: Secondary | ICD-10-CM

## 2016-08-20 DIAGNOSIS — Z79899 Other long term (current) drug therapy: Secondary | ICD-10-CM

## 2016-08-20 DIAGNOSIS — Z87891 Personal history of nicotine dependence: Secondary | ICD-10-CM | POA: Diagnosis not present

## 2016-08-20 DIAGNOSIS — E119 Type 2 diabetes mellitus without complications: Secondary | ICD-10-CM | POA: Diagnosis not present

## 2016-08-20 DIAGNOSIS — Z96651 Presence of right artificial knee joint: Secondary | ICD-10-CM | POA: Diagnosis present

## 2016-08-20 DIAGNOSIS — I10 Essential (primary) hypertension: Secondary | ICD-10-CM | POA: Diagnosis present

## 2016-08-20 DIAGNOSIS — R310 Gross hematuria: Secondary | ICD-10-CM | POA: Diagnosis not present

## 2016-08-20 DIAGNOSIS — H409 Unspecified glaucoma: Secondary | ICD-10-CM | POA: Diagnosis not present

## 2016-08-20 DIAGNOSIS — M109 Gout, unspecified: Secondary | ICD-10-CM | POA: Diagnosis not present

## 2016-08-20 DIAGNOSIS — E7800 Pure hypercholesterolemia, unspecified: Secondary | ICD-10-CM | POA: Diagnosis present

## 2016-08-20 HISTORY — PX: NEPHROLITHOTOMY: SHX5134

## 2016-08-20 HISTORY — PX: IR URETERAL STENT LEFT NEW ACCESS W/O SEP NEPHROSTOMY CATH: IMG6075

## 2016-08-20 LAB — TYPE AND SCREEN
ABO/RH(D): O POS
Antibody Screen: NEGATIVE

## 2016-08-20 LAB — HEMOGLOBIN AND HEMATOCRIT, BLOOD
HCT: 35.9 % — ABNORMAL LOW (ref 36.0–46.0)
Hemoglobin: 11.2 g/dL — ABNORMAL LOW (ref 12.0–15.0)

## 2016-08-20 LAB — GLUCOSE, CAPILLARY: Glucose-Capillary: 139 mg/dL — ABNORMAL HIGH (ref 65–99)

## 2016-08-20 LAB — PROTIME-INR
INR: 0.95
Prothrombin Time: 12.7 seconds (ref 11.4–15.2)

## 2016-08-20 SURGERY — NEPHROLITHOTOMY PERCUTANEOUS
Anesthesia: General | Site: Flank | Laterality: Left

## 2016-08-20 MED ORDER — CEFAZOLIN SODIUM-DEXTROSE 2-4 GM/100ML-% IV SOLN
2.0000 g | INTRAVENOUS | Status: DC
Start: 2016-08-20 — End: 2016-08-20

## 2016-08-20 MED ORDER — PROPOFOL 10 MG/ML IV BOLUS
INTRAVENOUS | Status: DC | PRN
  Administered 2016-08-20: 120 mg via INTRAVENOUS

## 2016-08-20 MED ORDER — MIDAZOLAM HCL 2 MG/2ML IJ SOLN
INTRAMUSCULAR | Status: AC
Start: 2016-08-20 — End: 2016-08-20
  Filled 2016-08-20: qty 8

## 2016-08-20 MED ORDER — SENNOSIDES-DOCUSATE SODIUM 8.6-50 MG PO TABS: 1.0000 | ORAL_TABLET | Freq: Every evening | ORAL | Status: DC | PRN

## 2016-08-20 MED ORDER — SODIUM CHLORIDE 0.9 % IV SOLN
INTRAVENOUS | Status: DC
  Administered 2016-08-20: 08:00:00 via INTRAVENOUS

## 2016-08-20 MED ORDER — ACETAMINOPHEN 500 MG PO TABS
1000.0000 mg | ORAL_TABLET | Freq: Two times a day (BID) | ORAL | Status: DC
  Administered 2016-08-20 – 2016-08-21 (×3): 1000 mg via ORAL
  Filled 2016-08-20 (×3): qty 2

## 2016-08-20 MED ORDER — POTASSIUM CHLORIDE CRYS ER 10 MEQ PO TBCR
10.0000 meq | EXTENDED_RELEASE_TABLET | ORAL | Status: DC
  Administered 2016-08-21 – 2016-08-23 (×2): 10 meq via ORAL
  Filled 2016-08-20 (×2): qty 1

## 2016-08-20 MED ORDER — ROCURONIUM BROMIDE 50 MG/5ML IV SOSY
PREFILLED_SYRINGE | INTRAVENOUS | Status: DC | PRN
  Administered 2016-08-20 (×2): 10 mg via INTRAVENOUS
  Administered 2016-08-20: 40 mg via INTRAVENOUS

## 2016-08-20 MED ORDER — FENTANYL CITRATE (PF) 100 MCG/2ML IJ SOLN
INTRAMUSCULAR | Status: AC | PRN
  Administered 2016-08-20: 50 ug via INTRAVENOUS
  Administered 2016-08-20 (×2): 25 ug via INTRAVENOUS

## 2016-08-20 MED ORDER — LATANOPROST 0.005 % OP SOLN
1.0000 [drp] | Freq: Every day | OPHTHALMIC | Status: DC
  Administered 2016-08-20 – 2016-08-22 (×3): 1 [drp] via OPHTHALMIC
  Filled 2016-08-20: qty 2.5

## 2016-08-20 MED ORDER — CEFAZOLIN SODIUM-DEXTROSE 2-4 GM/100ML-% IV SOLN: 2.0000 g | INTRAVENOUS | Status: DC

## 2016-08-20 MED ORDER — IOHEXOL 300 MG/ML  SOLN
INTRAMUSCULAR | Status: DC | PRN
  Administered 2016-08-20: 13 mL

## 2016-08-20 MED ORDER — ONDANSETRON HCL 4 MG/2ML IJ SOLN
INTRAMUSCULAR | Status: AC
  Filled 2016-08-20: qty 2

## 2016-08-20 MED ORDER — VITAMIN D3 25 MCG (1000 UNIT) PO TABS
1000.0000 [IU] | ORAL_TABLET | Freq: Every day | ORAL | Status: DC
  Administered 2016-08-20 – 2016-08-23 (×3): 1000 [IU] via ORAL
  Filled 2016-08-20 (×3): qty 1

## 2016-08-20 MED ORDER — FENTANYL CITRATE (PF) 100 MCG/2ML IJ SOLN
25.0000 ug | INTRAMUSCULAR | Status: DC | PRN
  Administered 2016-08-20: 25 ug via INTRAVENOUS
  Administered 2016-08-20: 50 ug via INTRAVENOUS

## 2016-08-20 MED ORDER — BISACODYL 10 MG RE SUPP: 10.0000 mg | Freq: Every day | RECTAL | Status: DC | PRN

## 2016-08-20 MED ORDER — SUGAMMADEX SODIUM 200 MG/2ML IV SOLN
INTRAVENOUS | Status: AC
  Filled 2016-08-20: qty 2

## 2016-08-20 MED ORDER — FENTANYL CITRATE (PF) 100 MCG/2ML IJ SOLN
INTRAMUSCULAR | Status: AC
  Filled 2016-08-20: qty 8

## 2016-08-20 MED ORDER — NIACIN ER (ANTIHYPERLIPIDEMIC) 500 MG PO TBCR
500.0000 mg | EXTENDED_RELEASE_TABLET | Freq: Two times a day (BID) | ORAL | Status: DC
  Administered 2016-08-20 – 2016-08-23 (×5): 500 mg via ORAL
  Filled 2016-08-20 (×6): qty 1

## 2016-08-20 MED ORDER — LIDOCAINE 2% (20 MG/ML) 5 ML SYRINGE
INTRAMUSCULAR | Status: AC
Start: 2016-08-20 — End: 2016-08-20
  Filled 2016-08-20: qty 5

## 2016-08-20 MED ORDER — ONDANSETRON HCL 4 MG/2ML IJ SOLN
INTRAMUSCULAR | Status: DC | PRN
  Administered 2016-08-20: 4 mg via INTRAVENOUS

## 2016-08-20 MED ORDER — FENTANYL CITRATE (PF) 100 MCG/2ML IJ SOLN
INTRAMUSCULAR | Status: AC
  Filled 2016-08-20: qty 2

## 2016-08-20 MED ORDER — ONDANSETRON HCL 4 MG/2ML IJ SOLN
4.0000 mg | INTRAMUSCULAR | Status: DC | PRN
Start: 2016-08-20 — End: 2016-08-23
  Administered 2016-08-22: 4 mg via INTRAVENOUS

## 2016-08-20 MED ORDER — LISINOPRIL 20 MG PO TABS
20.0000 mg | ORAL_TABLET | Freq: Two times a day (BID) | ORAL | Status: DC
  Administered 2016-08-20 – 2016-08-23 (×7): 20 mg via ORAL
  Filled 2016-08-20 (×7): qty 1

## 2016-08-20 MED ORDER — POTASSIUM CHLORIDE IN NACL 20-0.45 MEQ/L-% IV SOLN
INTRAVENOUS | Status: DC
  Administered 2016-08-20 – 2016-08-23 (×6): via INTRAVENOUS
  Filled 2016-08-20 (×9): qty 1000

## 2016-08-20 MED ORDER — FLEET ENEMA 7-19 GM/118ML RE ENEM: 1.0000 | ENEMA | Freq: Once | RECTAL | Status: DC | PRN

## 2016-08-20 MED ORDER — ROCURONIUM BROMIDE 50 MG/5ML IV SOSY
PREFILLED_SYRINGE | INTRAVENOUS | Status: AC
  Filled 2016-08-20: qty 5

## 2016-08-20 MED ORDER — FERROUS SULFATE 325 (65 FE) MG PO TABS
325.0000 mg | ORAL_TABLET | ORAL | Status: DC
  Administered 2016-08-21 – 2016-08-23 (×2): 325 mg via ORAL
  Filled 2016-08-20 (×2): qty 1

## 2016-08-20 MED ORDER — AMLODIPINE BESYLATE 5 MG PO TABS
5.0000 mg | ORAL_TABLET | Freq: Every day | ORAL | Status: DC
  Administered 2016-08-21 – 2016-08-23 (×3): 5 mg via ORAL
  Filled 2016-08-20 (×3): qty 1

## 2016-08-20 MED ORDER — PHENYLEPHRINE 40 MCG/ML (10ML) SYRINGE FOR IV PUSH (FOR BLOOD PRESSURE SUPPORT)
PREFILLED_SYRINGE | INTRAVENOUS | Status: AC
  Filled 2016-08-20: qty 10

## 2016-08-20 MED ORDER — MIDAZOLAM HCL 2 MG/2ML IJ SOLN
INTRAMUSCULAR | Status: AC | PRN
  Administered 2016-08-20 (×4): 1 mg via INTRAVENOUS

## 2016-08-20 MED ORDER — LIDOCAINE HCL 1 % IJ SOLN
INTRAMUSCULAR | Status: AC
  Filled 2016-08-20: qty 20

## 2016-08-20 MED ORDER — DEXAMETHASONE SODIUM PHOSPHATE 10 MG/ML IJ SOLN
INTRAMUSCULAR | Status: DC | PRN
  Administered 2016-08-20: 10 mg via INTRAVENOUS

## 2016-08-20 MED ORDER — CEFAZOLIN SODIUM-DEXTROSE 2-4 GM/100ML-% IV SOLN
INTRAVENOUS | Status: AC
  Administered 2016-08-20: 2 g via INTRAVENOUS
  Filled 2016-08-20: qty 100

## 2016-08-20 MED ORDER — ACETAMINOPHEN 325 MG PO TABS: 650.0000 mg | ORAL_TABLET | ORAL | Status: DC | PRN

## 2016-08-20 MED ORDER — DEXAMETHASONE SODIUM PHOSPHATE 10 MG/ML IJ SOLN: 10.0000 mg | Freq: Once | INTRAMUSCULAR | Status: DC | PRN

## 2016-08-20 MED ORDER — CEFAZOLIN SODIUM-DEXTROSE 2-4 GM/100ML-% IV SOLN
2.0000 g | INTRAVENOUS | Status: AC
  Administered 2016-08-20: 2 g via INTRAVENOUS

## 2016-08-20 MED ORDER — SUGAMMADEX SODIUM 200 MG/2ML IV SOLN
INTRAVENOUS | Status: DC | PRN
  Administered 2016-08-20: 200 mg via INTRAVENOUS

## 2016-08-20 MED ORDER — PROPOFOL 10 MG/ML IV BOLUS
INTRAVENOUS | Status: AC
  Filled 2016-08-20: qty 20

## 2016-08-20 MED ORDER — IOPAMIDOL (ISOVUE-300) INJECTION 61%
INTRAVENOUS | Status: AC
  Administered 2016-08-20: 25 mL
  Filled 2016-08-20: qty 50

## 2016-08-20 MED ORDER — SUCCINYLCHOLINE CHLORIDE 200 MG/10ML IV SOSY
PREFILLED_SYRINGE | INTRAVENOUS | Status: AC
  Filled 2016-08-20: qty 10

## 2016-08-20 MED ORDER — SUCCINYLCHOLINE CHLORIDE 200 MG/10ML IV SOSY
PREFILLED_SYRINGE | INTRAVENOUS | Status: DC | PRN
  Administered 2016-08-20: 120 mg via INTRAVENOUS

## 2016-08-20 MED ORDER — POTASSIUM CHLORIDE CRYS ER 20 MEQ PO TBCR
40.0000 meq | EXTENDED_RELEASE_TABLET | ORAL | Status: DC
  Administered 2016-08-20: 40 meq via ORAL
  Filled 2016-08-20: qty 2

## 2016-08-20 MED ORDER — IOPAMIDOL (ISOVUE-300) INJECTION 61%
25.0000 mL | Freq: Once | INTRAVENOUS | Status: AC | PRN
  Administered 2016-08-20: 25 mL

## 2016-08-20 MED ORDER — OXYCODONE HCL 5 MG PO TABS: 5.0000 mg | ORAL_TABLET | ORAL | Status: DC | PRN

## 2016-08-20 MED ORDER — HYDROMORPHONE HCL 2 MG/ML IJ SOLN
0.5000 mg | INTRAMUSCULAR | Status: DC | PRN
  Administered 2016-08-21 – 2016-08-22 (×2): 0.5 mg via INTRAVENOUS
  Filled 2016-08-20: qty 1

## 2016-08-20 MED ORDER — LIDOCAINE 2% (20 MG/ML) 5 ML SYRINGE
INTRAMUSCULAR | Status: DC | PRN
  Administered 2016-08-20: 80 mg via INTRAVENOUS

## 2016-08-20 MED ORDER — LIDOCAINE HCL 1 % IJ SOLN
INTRAMUSCULAR | Status: AC | PRN
  Administered 2016-08-20: 10 mL

## 2016-08-20 MED ORDER — INOSITOL NIACINATE 500 MG PO CAPS: 500.0000 mg | ORAL_CAPSULE | Freq: Two times a day (BID) | ORAL | Status: DC

## 2016-08-20 MED ORDER — SODIUM CHLORIDE 0.9 % IR SOLN
Status: DC | PRN
  Administered 2016-08-20: 24000 mL

## 2016-08-20 MED ORDER — ONDANSETRON HCL 4 MG/2ML IJ SOLN
4.0000 mg | Freq: Once | INTRAMUSCULAR | Status: AC
  Administered 2016-08-20: 4 mg via INTRAVENOUS

## 2016-08-20 MED ORDER — LACTATED RINGERS IV SOLN
INTRAVENOUS | Status: DC | PRN
  Administered 2016-08-20: 11:00:00 via INTRAVENOUS

## 2016-08-20 MED ORDER — DEXAMETHASONE SODIUM PHOSPHATE 10 MG/ML IJ SOLN
INTRAMUSCULAR | Status: AC
  Filled 2016-08-20: qty 1

## 2016-08-20 SURGICAL SUPPLY — 45 items
APL SKNCLS STERI-STRIP NONHPOA (GAUZE/BANDAGES/DRESSINGS) ×4
BAG URINE DRAINAGE (UROLOGICAL SUPPLIES) ×2 IMPLANT
BASKET ZERO TIP NITINOL 2.4FR (BASKET) ×2 IMPLANT
BENZOIN TINCTURE PRP APPL 2/3 (GAUZE/BANDAGES/DRESSINGS) ×8 IMPLANT
BLADE SURG 15 STRL LF DISP TIS (BLADE) ×1 IMPLANT
BLADE SURG 15 STRL SS (BLADE) ×2
BSKT STON RTRVL ZERO TP 2.4FR (BASKET) ×1
CATCHER STONE W/TUBE ADAPTER (UROLOGICAL SUPPLIES) IMPLANT
CATH AINSWORTH 30CC 24FR (CATHETERS) ×2 IMPLANT
CATH ROBINSON RED A/P 20FR (CATHETERS) IMPLANT
CATH URET 5FR 28IN OPEN ENDED (CATHETERS) IMPLANT
CATH URET DUAL LUMEN 6-10FR 50 (CATHETERS) ×2 IMPLANT
CATH UROLOGY TORQUE 40 (MISCELLANEOUS) ×1 IMPLANT
CATH X-FORCE N30 NEPHROSTOMY (TUBING) ×2 IMPLANT
COVER SURGICAL LIGHT HANDLE (MISCELLANEOUS) ×2 IMPLANT
DRAPE C-ARM 42X120 X-RAY (DRAPES) ×2 IMPLANT
DRAPE LINGEMAN PERC (DRAPES) ×2 IMPLANT
DRAPE SURG IRRIG POUCH 19X23 (DRAPES) ×2 IMPLANT
DRSG PAD ABDOMINAL 8X10 ST (GAUZE/BANDAGES/DRESSINGS) ×4 IMPLANT
DRSG TEGADERM 8X12 (GAUZE/BANDAGES/DRESSINGS) ×4 IMPLANT
FIBER LASER FLEXIVA 365 (UROLOGICAL SUPPLIES) ×1 IMPLANT
GAUZE SPONGE 4X4 12PLY STRL (GAUZE/BANDAGES/DRESSINGS) ×2 IMPLANT
GLOVE SURG SS PI 8.0 STRL IVOR (GLOVE) IMPLANT
GOWN STRL REUS W/TWL XL LVL3 (GOWN DISPOSABLE) ×2 IMPLANT
GUIDEWIRE AMPLAZ .035X145 (WIRE) ×4 IMPLANT
GUIDEWIRE STR DUAL SENSOR (WIRE) ×1 IMPLANT
KIT BASIN OR (CUSTOM PROCEDURE TRAY) ×2 IMPLANT
MANIFOLD NEPTUNE II (INSTRUMENTS) ×2 IMPLANT
MASK EYE SHIELD (GAUZE/BANDAGES/DRESSINGS) ×2 IMPLANT
NS IRRIG 1000ML POUR BTL (IV SOLUTION) ×2 IMPLANT
PACK CYSTO (CUSTOM PROCEDURE TRAY) ×2 IMPLANT
PROBE KIDNEY STONEBRKR 2.0X425 (ELECTROSURGICAL) ×1 IMPLANT
PROBE LITHOCLAST ULTRA 3.8X403 (UROLOGICAL SUPPLIES) ×1 IMPLANT
PROBE PNEUMATIC 1.0MMX570MM (UROLOGICAL SUPPLIES) ×1 IMPLANT
SET IRRIG Y TYPE TUR BLADDER L (SET/KITS/TRAYS/PACK) ×2 IMPLANT
SPONGE LAP 4X18 X RAY DECT (DISPOSABLE) ×2 IMPLANT
STONE CATCHER W/TUBE ADAPTER (UROLOGICAL SUPPLIES) IMPLANT
SUT SILK 2 0 30  PSL (SUTURE) ×1
SUT SILK 2 0 30 PSL (SUTURE) ×1 IMPLANT
SYR 10ML LL (SYRINGE) ×2 IMPLANT
SYR 20CC LL (SYRINGE) ×4 IMPLANT
TOWEL OR 17X26 10 PK STRL BLUE (TOWEL DISPOSABLE) ×2 IMPLANT
TOWEL OR NON WOVEN STRL DISP B (DISPOSABLE) ×2 IMPLANT
TRAY FOLEY W/METER SILVER 16FR (SET/KITS/TRAYS/PACK) ×2 IMPLANT
TUBING CONNECTING 10 (TUBING) ×6 IMPLANT

## 2016-08-20 NOTE — Anesthesia Preprocedure Evaluation (Signed)
Anesthesia Evaluation  Patient identified by MRN, date of birth, ID band Patient awake    Reviewed: Allergy & Precautions, H&P , Patient's Chart, lab work & pertinent test results, reviewed documented beta blocker date and time   Airway Mallampati: II  TM Distance: >3 FB Neck ROM: full    Dental no notable dental hx.    Pulmonary former smoker,    Pulmonary exam normal breath sounds clear to auscultation       Cardiovascular hypertension, On Medications  Rhythm:regular Rate:Normal     Neuro/Psych    GI/Hepatic   Endo/Other  diabetes  Renal/GU      Musculoskeletal   Abdominal   Peds  Hematology   Anesthesia Other Findings   Reproductive/Obstetrics                             Anesthesia Physical Anesthesia Plan  ASA: II  Anesthesia Plan: General   Post-op Pain Management:    Induction: Intravenous  Airway Management Planned: LMA  Additional Equipment:   Intra-op Plan:   Post-operative Plan:   Informed Consent: I have reviewed the patients History and Physical, chart, labs and discussed the procedure including the risks, benefits and alternatives for the proposed anesthesia with the patient or authorized representative who has indicated his/her understanding and acceptance.   Dental Advisory Given  Plan Discussed with: CRNA and Surgeon  Anesthesia Plan Comments: ( )        Anesthesia Quick Evaluation

## 2016-08-20 NOTE — Sedation Documentation (Signed)
Pt reports the left arm was causing her discomfort.  Pt stated, "I'm not used to having this arm up this high."  Pt's arm repositioned and pt states she is more comfortable.

## 2016-08-20 NOTE — Interval H&P Note (Signed)
History and Physical Interval Note:  08/20/2016 11:08 AM  Gwendolyn Snyder  has presented today for surgery, with the diagnosis of LEFT STAGHORN STONE  The various methods of treatment have been discussed with the patient and family. After consideration of risks, benefits and other options for treatment, the patient has consented to  Procedure(s): LEFT NEPHROLITHOTOMY PERCUTANEOUS FIRST STAGE (Left) as a surgical intervention .  The patient's history has been reviewed, patient examined, no change in status, stable for surgery.  I have reviewed the patient's chart and labs.  Questions were answered to the patient's satisfaction.     Manas Hickling J

## 2016-08-20 NOTE — Anesthesia Procedure Notes (Signed)
Procedure Name: Intubation Date/Time: 08/20/2016 11:38 AM Performed by: Dione Booze Pre-anesthesia Checklist: Emergency Drugs available, Suction available, Patient being monitored and Patient identified Patient Re-evaluated:Patient Re-evaluated prior to inductionOxygen Delivery Method: Circle system utilized Preoxygenation: Pre-oxygenation with 100% oxygen Laryngoscope Size: Mac and 4 Grade View: Grade II Tube type: Oral Tube size: 7.5 mm Number of attempts: 1 Airway Equipment and Method: Stylet Placement Confirmation: ETT inserted through vocal cords under direct vision,  positive ETCO2 and breath sounds checked- equal and bilateral Secured at: 21 cm Tube secured with: Tape Dental Injury: Teeth and Oropharynx as per pre-operative assessment

## 2016-08-20 NOTE — H&P (Signed)
Chief Complaint: Patient was seen in consultation today for left PCN access for PCNL today at the request of [name],[name]  Referring Physician(s): [name],[name]  Supervising Physician: [name]  Patient Status: [facility] - Out-pt  History of Present Illness: Gwendolyn Snyder is a 66 y.o. female with large left staghorn calculus which she is scheduled for PCNL today with Dr. Annabell Howells. IR is asked to place upper and lower pole PCN access today. PMHx, meds, labs, imaging reviewed. She has been NPO this am. Family is at bedside. She feels ok, no recent fevers, chills.  Past Medical History:  Diagnosis Date  . Anemia   . Anxiety   . Arthritis   . Bilateral carpal tunnel syndrome    right hand- surgery , left hand has not and left hand goes to sleep   . Cataracts, bilateral   . Complication of anesthesia    once after surgery felt like she could not breathe- 11/09/15- surgery greater than 11 years ago  . COPD (chronic obstructive pulmonary disease) (HCC)   . Diabetes mellitus without complication (HCC)    diet controlled , type II   . Family history of adverse reaction to anesthesia    sister- confused  . Glaucoma   . History of hiatal hernia   . History of kidney stones   . Hypertension   . Shortness of breath dyspnea    with exertion    Past Surgical History:  Procedure Laterality Date  . ABDOMINAL HYSTERECTOMY    . CARPAL TUNNEL RELEASE Right   . COLONOSCOPY    . JOINT REPLACEMENT    . Removal kidney stone    . TOTAL HIP ARTHROPLASTY Bilateral   . TOTAL HIP REVISION Left 11/22/2015   Procedure: Revision Left Total Hip Arthroplasty, Poly and Newman Pies Exchange;  Surgeon: Eldred Manges, MD;  Location: [facility];  Service: Orthopedics;  Laterality: Left;  . TOTAL KNEE ARTHROPLASTY Right     Allergies: Aspirin and Codeine  Medications: Prior to Admission medications   Medication Sig Start Date End Date Taking? Authorizing Provider  acetaminophen (TYLENOL) 650 MG CR tablet Take  1,300 mg by mouth 2 (two) times daily.   Yes Historical Provider, MD  amLODipine (NORVASC) 5 MG tablet Take 5 mg by mouth daily.  08/15/15  Yes Historical Provider, MD  cholecalciferol (VITAMIN D) 1000 units tablet Take 1,000 Units by mouth daily.   Yes Historical Provider, MD  Inositol Niacinate (NIACIN FLUSH FREE) 500 MG CAPS Take 500 mg by mouth 2 (two) times daily.    Yes Historical Provider, MD  latanoprost (XALATAN) 0.005 % ophthalmic solution Place 1 drop into both eyes at bedtime. 08/15/15  Yes Historical Provider, MD  lisinopril (PRINIVIL,ZESTRIL) 20 MG tablet Take 20 mg by mouth 2 (two) times daily.  07/10/15  Yes Historical Provider, MD  potassium chloride SA (K-DUR,KLOR-CON) 20 MEQ tablet Take 20-40 mEq by mouth See admin instructions. Alternate between 1 tablet (20 meq) & 2 tablets (40 meq) every other day   Yes Historical Provider, MD  ferrous sulfate 325 (65 FE) MG tablet Take 325 mg by mouth every Monday, Wednesday, and Friday. In the morning    Historical Provider, MD     No family history on file.  Social History   Social History  . Marital status: Single    Spouse name: N/A  . Number of children: N/A  . Years of education: N/A   Social History Main Topics  . Smoking status: Former Smoker  Packs/day: 1.00    Years: 30.00    Types: Cigarettes    Quit date: 05/13/2004  . Smokeless tobacco: Never Used  . Alcohol use No  . Drug use: No  . Sexual activity: Not on file   Other Topics Concern  . Not on file   Social History Narrative  . No narrative on file    Review of Systems: A 12 point ROS discussed and pertinent positives are indicated in the HPI above.  All other systems are negative.  Review of Systems  Vital Signs: BP (!) 186/80   Pulse 95   Temp 98.3 F (36.8 C) (Oral)   Resp 18   SpO2 100%   Physical Exam  Constitutional: She is oriented to person, place, and time. She appears well-developed and well-nourished. No distress.  HENT:  Head:  Normocephalic.  Mouth/Throat: Oropharynx is clear and moist.  Neck: Normal range of motion. No tracheal deviation present.  Cardiovascular: Normal rate, regular rhythm and normal heart sounds.   Pulmonary/Chest: Effort normal and breath sounds normal. No respiratory distress.  Abdominal: Soft. She exhibits no mass. There is no tenderness.  Neurological: She is alert and oriented to person, place, and time.  Skin: Skin is warm and dry.  Psychiatric: She has a normal mood and affect. Judgment normal.    Mallampati Score:  MD Evaluation Airway: WNL Heart: WNL Abdomen: WNL Chest/ Lungs: WNL ASA  Classification: 2 Mallampati/Airway Score: One  Imaging: No results found.  Labs:  CBC:  Recent Labs  11/09/15 0932 11/23/15 0708 11/24/15 0317 08/14/16 0849  WBC 8.6 9.5 8.1 8.4  HGB 11.2* 9.0* 8.2* 11.0*  HCT 38.6 30.3* 26.8* 34.6*  PLT 288 262 217 295    COAGS:  Recent Labs  10/02/15 1047  INR 1.01    BMP:  Recent Labs  11/23/15 0708 11/24/15 0317 11/24/15 1211 08/14/16 0849  NA 140 140 140 143  K 2.8* 2.7* 3.0* 3.4*  CL 110 110 108 109  CO2 GLUCOSE 102* 109* 119* 117*  BUN CALCIUM 8.6* 8.5* 8.8* 9.4  CREATININE 1.01* 1.19* 1.14* 1.01*  GFRNONAA 58* 47* 50* 57*  GFRAA >60 55* 58* >60    LIVER FUNCTION TESTS:  Recent Labs  10/02/15 1047  BILITOT 0.5  AST 19  ALT 13*  ALKPHOS 71  PROT 7.6  ALBUMIN 3.9    TUMOR MARKERS: No results for input(s): AFPTM, CEA, CA199, CHROMGRNA in the last 8760 hours.  Assessment and Plan: Large left staghorn calculus For attempt at (L)PCN upper and lower pole access, followed by PCNL. Labs pending Risks and Benefits discussed with the patient including, but not limited to infection, bleeding, significant bleeding causing loss or decrease in renal function or damage to adjacent structures.  All of the patient's questions were answered, patient is agreeable to proceed. Consent signed and  in chart.    Thank you for this interesting consult.  I greatly enjoyed meeting [name] and look forward to participating in their care.  A copy of this report was sent to the requesting provider on this date.  Electronically Signed: Brayton El 08/20/2016, 8:37 AM   I spent a total of 20 minutes in face to face in clinical consultation, greater than 50% of which was counseling/coordinating care for PCN placement

## 2016-08-20 NOTE — Transfer of Care (Signed)
Immediate Anesthesia Transfer of Care Note  Patient: Gwendolyn Snyder  Procedure(s) Performed: Procedure(s): LEFT NEPHROLITHOTOMY PERCUTANEOUS FIRST STAGE (Left)  Patient Location: PACU  Anesthesia Type:General  Level of Consciousness: awake, alert , oriented and patient cooperative  Airway & Oxygen Therapy: Patient Spontanous Breathing and Patient connected to face mask oxygen  Post-op Assessment: Report given to RN and Post -op Vital signs reviewed and stable  Post vital signs: Reviewed and stable  Last Vitals:  Vitals:   08/20/16 1127 08/20/16 1128  BP:    Pulse: 81 80    Last Pain:  Vitals:   08/20/16 1114  PainSc: 3       Patients Stated Pain Goal: 3 (08/20/16 1114)  Complications: No apparent anesthesia complications

## 2016-08-20 NOTE — Procedures (Signed)
Interventional Radiology Procedure Note  Procedure: Placement of a left nephroureteral tube for OR today - plan for nephro-lithotomy.    Access is via a posterior inferior calyx.    The collecting system and calyces are entirely casted with stone. No hydro.  .  Complications: None Recommendations:  - OK for OR. - Discussed findings with Dr. Annabell Howells.  OK for single puncture for proceeding to OR. - Do not  - Maintain NPO.   Signed,  Yvone Neu. Loreta Ave, DO

## 2016-08-20 NOTE — Anesthesia Postprocedure Evaluation (Signed)
Anesthesia Post Note  Patient: Gwendolyn Snyder  Procedure(s) Performed: Procedure(s) (LRB): LEFT NEPHROLITHOTOMY PERCUTANEOUS FIRST STAGE (Left)  Patient location during evaluation: PACU Anesthesia Type: General Level of consciousness: awake and alert Pain management: pain level controlled Vital Signs Assessment: post-procedure vital signs reviewed and stable Respiratory status: spontaneous breathing, nonlabored ventilation, respiratory function stable and patient connected to nasal cannula oxygen Cardiovascular status: blood pressure returned to baseline and stable Postop Assessment: no signs of nausea or vomiting Anesthetic complications: no       Last Vitals:  Vitals:   08/20/16 1442 08/20/16 1458  BP:  (!) 146/64  Pulse: 63 72  Resp: 16 14  Temp: 36.4 C 36.4 C    Last Pain:  Vitals:   08/20/16 1517  TempSrc:   PainSc: 3                  Cregg Jutte EDWARD

## 2016-08-20 NOTE — Brief Op Note (Signed)
08/20/2016  1:21 PM  PATIENT:  Gwendolyn Snyder  66 y.o. female  PRE-OPERATIVE DIAGNOSIS:  LEFT STAGHORN STONE  POST-OPERATIVE DIAGNOSIS:  LEFT STAGHORN STONE  PROCEDURE:  Procedure(s): LEFT NEPHROLITHOTOMY PERCUTANEOUS FIRST STAGE (Left) >2cm stone  SURGEON:  Surgeon(s) and Role:    * Bjorn Pippin, MD - Primary  PHYSICIAN ASSISTANT:   ASSISTANTS: none   ANESTHESIA:   general  EBL:  Total I/O In: 1100 [I.V.:1100] Out: 300 [Urine:300]  BLOOD ADMINISTERED:none  DRAINS: Urinary Catheter (Foley) and 53fr and 107fr right nephrostomies   LOCAL MEDICATIONS USED:  NONE  SPECIMEN:  Source of Specimen:  stone fragments  DISPOSITION OF SPECIMEN:  PATHOLOGY  COUNTS:  YES  TOURNIQUET:  * No tourniquets in log *  DICTATION: .Other Dictation: Dictation Number [id number]  PLAN OF CARE: Admit for overnight observation  PATIENT DISPOSITION:  PACU - hemodynamically stable.   Delay start of Pharmacological VTE agent (>24hrs) due to surgical blood loss or risk of bleeding: yes

## 2016-08-21 ENCOUNTER — Observation Stay (HOSPITAL_COMMUNITY): Payer: PPO

## 2016-08-21 DIAGNOSIS — N200 Calculus of kidney: Secondary | ICD-10-CM | POA: Diagnosis not present

## 2016-08-21 LAB — HEMOGLOBIN AND HEMATOCRIT, BLOOD
HCT: 34.3 % — ABNORMAL LOW (ref 36.0–46.0)
Hemoglobin: 10.5 g/dL — ABNORMAL LOW (ref 12.0–15.0)

## 2016-08-21 LAB — SURGICAL PCR SCREEN
MRSA, PCR: NEGATIVE
Staphylococcus aureus: NEGATIVE

## 2016-08-21 MED ORDER — CEFAZOLIN SODIUM-DEXTROSE 2-4 GM/100ML-% IV SOLN
2.0000 g | INTRAVENOUS | Status: AC
  Administered 2016-08-22: 2 g via INTRAVENOUS
  Filled 2016-08-21: qty 100

## 2016-08-21 NOTE — Op Note (Signed)
NAME:  Gwendolyn Snyder, Gwendolyn Snyder                     ACCOUNT NO.:  MEDICAL RECORD NO.:  [id number]  LOCATION:                                 FACILITY:  PHYSICIAN:  Excell Seltzer. Annabell Howells, M.D.         DATE OF BIRTH:  DATE OF PROCEDURE:  08/20/2016 DATE OF DISCHARGE:                              OPERATIVE REPORT   PREOPERATIVE DIAGNOSIS:  Left staghorn calculus.  POSTOPERATIVE DIAGNOSIS:  Left staghorn calculus.  PROCEDURE:  First-stage left percutaneous nephrolithotomy for greater than 2 cm stone.  SURGEON:  Excell Seltzer. Annabell Howells, M.D.  ANESTHESIA:  General.  SPECIMEN:  Stone fragments.  DRAIN:  Foley catheter, 24-French Ainsworth nephrostomy tube and 6- [place] Kumpe nephrostomy catheter.  BLOOD LOSS:  Approximately 100 mL.  COMPLICATIONS:  None.  INDICATIONS:  Ms. Sinning is a 66 year old white female who originally presented with gross hematuria.  She was found on evaluation to have a large 5-to 6-cm staghorn calculus of the left kidney forming complete cast to the kidney.  It was felt that percutaneous nephrolithotomy was indicated findings.  FINDINGS AND PROCEDURE:  She was taken earlier today and underwent placement of a mid pole posterior nephrostomy catheter.  She was then taken to the operating room.  She had received had received Ancef for the nephrostomy.  A general anesthetic was induced on the holding room stretcher.  A Foley catheter was inserted using sterile technique, and she was rolled prone on chest rolls with all pressure points padded. Her nephrostomy site was prepped with ChloraPrep, and she was draped in usual sterile fashion.  The previously placed nephrostomy catheter was cannulated with an Amplatz superstiff wire which was passed to the bladder and the nephrostomy catheter was removed.  A knife was used to increase the incision to 2 cm and a dual-lumen 8- French catheter was then passed over the wire.  I was unable to negotiate it by the stone and a second wire then  coiled in the collecting system.  Multiple attempts to try to adjust this including using an angle-tip catheter were unsuccessful.  So, I made the decision to go ahead and pass the NephroMax nephrostomy balloon over the wire coiled in the pelvis.  The tip of the balloon was placed in the pelvis and the balloon was inflated to 18 atmospheres.  The sheath was then inserted easily over the wire into the renal pelvis and the balloon was deflated.  The wire was left in place, but eventually came out because it had poor purchase within the kidney.  Initially, she was noted to have multiple stones in the renal pelvis including round gravel and sand in addition to several large stones.  The percutaneous ultrasonic lithotrite was then passed and a lot of the smaller stones was vacuumed out, some of it was removed with a 3-prong grasper as well.  I then engaged the large renal pelvic stone with the lithotrite and broke it into manageable fragments which were then removed.  Another large stone fell in from an additional calyx and this was then broken up and removed as well.  I then attempted to get into the upper  pole calyx where fluoroscopy revealed a large stone burden, however, was unsuccessful due to the angulation.  I then switched to the flexible nephroscope, which I was able to negotiate into the upper pole collecting system.  There was some old clot here as there had been in the renal pelvis from her prior episode of hematuria.  There were multiple stones in the upper pole.  These were engaged with a 365 micron laser fiber, initially at 1 watt and 20 hertz but eventually at 40 hertz.  The stone broke up well and flushed into the renal pelvis.  The stone fragments in the renal pelvis were then removed using a combination of the 3-prong grasper and the ultrasonic lithotrite. Eventually, only a small amount of grit and tiny stones remained in the renal pelvis.  There did appear to be a  perforation at the renal pelvis, and I felt that since a second-look procedure was going to be indicated for completion of the procedure that I would forego further instrumentation at this time to avoid further damage to renal pelvis.  A Sensor guidewire was then passed using the angled-tip catheter through the rigid nephroscope, and I was able to successfully negotiate that to the bladder.  The 6-French short angle catheter was then passed over the safety wire into the distal ureter, and the wire was removed.  A 24-French Ainsworth catheter was then passed over the wire through the sheath.  The sheath was backed out, the balloon for the nephrostomy catheter was filled with 3 mL of water and contrast was instilled.  No significant extravasation was noted.  No obvious filling defects suggesting residual stone remained.  However, there had been some mid-pole calyceal stones that I was not sure that I did not obviously access during the procedure.  At this point, the sheath was backed out the rest of the way.  The nephrostomy catheters were secured to the skin with 2-0 silk suture, and a final nephrostogram was performed once the wire was removed from the 24 catheter.  It demonstrated good position in the renal pelvis of the nephrostomy catheter and no significant extravasation.  At this point, the drapes removed.  A dressing was applied.  The large nephrostomy catheter was placed to straight drainage, and the small catheter was capped.  The patient was then rolled back prone on the recovery room stretcher, her anesthetic was reversed, and she was moved to the recovery room in stable condition.  There were no complications.    Excell Seltzer. Annabell Howells, M.D.    JJW/MEDQ  D:  08/20/2016  T:  08/20/2016  Job:  [id number]

## 2016-08-21 NOTE — Progress Notes (Signed)
Patient foley catheter leaking. PA Dancy notified. Verbal order given to advance foley and if foley catheter still leaking to ask patient if she wants to exchange the size to 16 fr. Foley catheter still leaking after being advanced. Patient opted to exchange catheter. Foley catheter 14 fr removed and foley catheter 16 fr placed. No leakage noted after exchange. Will continue to monitor closely.

## 2016-08-21 NOTE — Progress Notes (Signed)
1 Day Post-Op  Subjective: She is doing well post left PCNL but had some foley issue with obstruction and required a change.  She has minimal pain and the urine is light pink.   Her Hgb is down to 10.5.   CT this AM shows residual LLP and LMP stones.  ROS:  Review of Systems  Constitutional: Negative for fever.  Gastrointestinal: Negative for nausea.  Genitourinary: Positive for flank pain (decreased).    Anti-infectives: Anti-infectives    Start     Dose/Rate Route Frequency Ordered Stop   08/22/16 1215  ceFAZolin (ANCEF) IVPB 2g/100 mL premix     2 g 200 mL/hr over 30 Minutes Intravenous Every 8 hours 08/21/16 0820     08/20/16 0830  ceFAZolin (ANCEF) IVPB 2g/100 mL premix  Status:  Discontinued     2 g 200 mL/hr over 30 Minutes Intravenous To Radiology 08/20/16 0730 08/20/16 0738   08/20/16 0730  ceFAZolin (ANCEF) IVPB 2g/100 mL premix  Status:  Discontinued     2 g 200 mL/hr over 30 Minutes Intravenous 30 min pre-op 08/20/16 0730 08/20/16 0738      Current Facility-Administered Medications  Medication Dose Route Frequency Provider Last Rate Last Dose  . 0.45 % NaCl with KCl 20 mEq / L infusion   Intravenous Continuous Mauriah Mcmillen, MD 100 mL/hr at 08/21/16 0114    . acetaminophen (TYLENOL) tablet 1,000 mg  1,000 mg Oral BID Bryce Kimble, MD   1,000 mg at 08/20/16 2217  . acetaminophen (TYLENOL) tablet 650 mg  650 mg Oral Q4H PRN Freemon Binford, MD      . amLODipine (NORVASC) tablet 5 mg  5 mg Oral Daily Carianne Taira, MD      . bisacodyl (DULCOLAX) suppository 10 mg  10 mg Rectal Daily PRN Jessyka Austria, MD      . [START ON 08/22/2016] ceFAZolin (ANCEF) IVPB 2g/100 mL premix  2 g Intravenous Q8H Kei Mcelhiney, MD      . cholecalciferol (VITAMIN D) tablet 1,000 Units  1,000 Units Oral Daily Rawlin Reaume, MD   1,000 Units at 08/20/16 1804  . ferrous sulfate tablet 325 mg  325 mg Oral Q M,W,F Pecolia Marando, MD      . HYDROmorphone (DILAUDID) injection 0.5-1 mg  0.5-1 mg Intravenous Q2H PRN Lauria Depoy,  MD   0.5 mg at 08/21/16 0225  . latanoprost (XALATAN) 0.005 % ophthalmic solution 1 drop  1 drop Both Eyes QHS Aayush Gelpi, MD   1 drop at 08/20/16 2216  . lisinopril (PRINIVIL,ZESTRIL) tablet 20 mg  20 mg Oral BID Diogo Anne, MD   20 mg at 08/20/16 2216  . niacin (NIASPAN) CR tablet 500 mg  500 mg Oral BID Brit Carbonell, MD   500 mg at 08/20/16 2216  . ondansetron (ZOFRAN) injection 4 mg  4 mg Intravenous Q4H PRN Kaileena Obi, MD      . oxyCODONE (Oxy IR/ROXICODONE) immediate release tablet 5 mg  5 mg Oral Q4H PRN Issaih Kaus, MD      . potassium chloride (K-DUR,KLOR-CON) CR tablet 10 mEq  10 mEq Oral QODAY Emrys Mckamie, MD      . potassium chloride SA (K-DUR,KLOR-CON) CR tablet 40 mEq  40 mEq Oral QODAY Abel Ra, MD   40 mEq at 08/20/16 1804  . senna-docusate (Senokot-S) tablet 1 tablet  1 tablet Oral QHS PRN Davelle Anselmi, MD      . sodium phosphate (FLEET) 7-19 GM/118ML enema 1 enema  1 enema Rectal   Once PRN Kilynn Fitzsimmons, MD         Objective: Vital signs in last 24 hours: Temp:  [97.5 F (36.4 C)-98.3 F (36.8 C)] 97.8 F (36.6 C) (04/11 0536) Pulse Rate:  [63-89] 79 (04/11 0536) Resp:  [14-25] 16 (04/11 0536) BP: (138-173)/(64-86) 142/78 (04/11 0536) SpO2:  [96 %-100 %] 100 % (04/11 0536) Weight:  [85 kg (187 lb 6.3 oz)] 85 kg (187 lb 6.3 oz) (04/10 1458)  Intake/Output from previous day: 04/10 0701 - 04/11 0700 In: 2940 [P.O.:360; I.V.:2580] Out: 1295 [Urine:1195; Blood:100] Intake/Output this shift: No intake/output data recorded.   Physical Exam  Constitutional: She is well-developed, well-nourished, and in no distress.  Vitals reviewed.   Lab Results:   Recent Labs  08/20/16 1656 08/21/16 0602  HGB 11.2* 10.5*  HCT 35.9* 34.3*   BMET No results for input(s): NA, K, CL, CO2, GLUCOSE, BUN, CREATININE, CALCIUM in the last 72 hours. PT/INR  Recent Labs  08/20/16 0750  LABPROT 12.7  INR 0.95   ABG No results for input(s): PHART, HCO3 in the last 72 hours.  Invalid  input(s): PCO2, PO2  Studies/Results: Dg C-arm 61-120 Min-no Report  Result Date: 08/20/2016 Fluoroscopy was utilized by the requesting physician.  No radiographic interpretation.   Ir Ureteral Stent Left New Access W/o Sep Nephrostomy Cath  Result Date: 08/20/2016 INDICATION: 65-year-old female with a history of left renal staghorn calculus EXAM: IR URETURAL STENT LEFT NEW ACCESS W/O SEP NEPHROSTOMY CATH COMPARISON:  CT 08/08/2016 MEDICATIONS: 2.0 g Ancef; The antibiotic was administered in an appropriate time frame prior to skin puncture. ANESTHESIA/SEDATION: Fentanyl 100 mcg IV; Versed 4.0 mg IV Moderate Sedation Time:  47 The patient was continuously monitored during the procedure by the interventional radiology nurse under my direct supervision. CONTRAST:  50 cc - administered into the collecting system(s) FLUOROSCOPY TIME:  Fluoroscopy Time: 14 minutes 6 seconds (869.3 mGy). COMPLICATIONS: None PROCEDURE: Informed written consent was obtained from the patient after a thorough discussion of the procedural risks, benefits and alternatives. All questions were addressed. Maximal Sterile Barrier Technique was utilized including caps, mask, sterile gowns, sterile gloves, sterile drape, hand hygiene and skin antiseptic. A timeout was performed prior to the initiation of the procedure. Patient positioned prone position on the fluoroscopy table. Ultrasound survey of the left flank was performed with images stored and sent to PACs. The patient was then prepped and draped in the usual sterile fashion. 1% lidocaine was used to anesthetize the skin and subcutaneous tissues for local anesthesia. A Chiba needle was then used to access the superior aspect of the collecting system within AP approach under fluoroscopic guidance. The target was the stone cast of the superior collecting system. Small amount of contrast was infused through the needle, confirming location within the collecting system. With contrast infused  around the casting stone, and inferior posterior calyx was selected for targeting. A second 21 gauge needle was advanced under fluoroscopy into the selected calyx/infundibulum. Once we confirmed location of the needle tip with contrast infusion, an 014 wire was placed. An Accustick system was then advanced over the wire into the collecting system under fluoroscopy. The metal stiffener and inner dilator were removed. Combination of Glidewire and an angled 4 French catheter were used to navigate the Glidewire into the ureter, ultimately with the Glidewire and glide cath in the urinary bladder. A 035 Coons wire was then placed through the glide cath. A 5 French catheter was then advanced over the 035 wire into   the urinary bladder. Small amount of contrast confirmed position of the catheter. Catheter was sutured to the skin. Patient tolerated the procedure well and remained hemodynamically stable throughout. No complications were encountered and no significant blood loss encountered IMPRESSION: Status post placement of right percutaneous nephroureteral tube for operative nephrolithotomy. Signed, Jaime S. Wagner, DO [facility] Electronically Signed   By: Jaime  Wagner D.O.   On: 08/20/2016 11:28   CT from this am reviewed.   Report pending but residual stones noted in mid and lower pole.   Assessment and Plan: Left staghorn stone doing well post first stage PCNL.    She has residual lower and mid pole stones and I will proceed with the second stage procedure on 4/12 as planned.       LOS: 0 days    Gwendolyn Snyder J 08/21/2016 [phone] ID: Gwendolyn Snyder, female   DOB: 11/21/1950, 65 y.o.   MRN: [id number]  

## 2016-08-22 ENCOUNTER — Observation Stay (HOSPITAL_COMMUNITY): Payer: PPO | Admitting: Anesthesiology

## 2016-08-22 ENCOUNTER — Encounter (HOSPITAL_COMMUNITY): Payer: Self-pay | Admitting: Anesthesiology

## 2016-08-22 ENCOUNTER — Encounter (HOSPITAL_COMMUNITY)
Admission: RE | Disposition: A | Discharge status: Home / Self Care | Payer: Self-pay | Source: Ambulatory Visit | Attending: Urology

## 2016-08-22 ENCOUNTER — Observation Stay (HOSPITAL_COMMUNITY): Payer: PPO

## 2016-08-22 DIAGNOSIS — Z96651 Presence of right artificial knee joint: Secondary | ICD-10-CM | POA: Diagnosis not present

## 2016-08-22 DIAGNOSIS — J449 Chronic obstructive pulmonary disease, unspecified: Secondary | ICD-10-CM | POA: Diagnosis not present

## 2016-08-22 DIAGNOSIS — F419 Anxiety disorder, unspecified: Secondary | ICD-10-CM | POA: Diagnosis not present

## 2016-08-22 DIAGNOSIS — E119 Type 2 diabetes mellitus without complications: Secondary | ICD-10-CM | POA: Diagnosis not present

## 2016-08-22 DIAGNOSIS — H409 Unspecified glaucoma: Secondary | ICD-10-CM | POA: Diagnosis not present

## 2016-08-22 DIAGNOSIS — Z79899 Other long term (current) drug therapy: Secondary | ICD-10-CM | POA: Diagnosis not present

## 2016-08-22 DIAGNOSIS — Z96643 Presence of artificial hip joint, bilateral: Secondary | ICD-10-CM | POA: Diagnosis not present

## 2016-08-22 DIAGNOSIS — Z87891 Personal history of nicotine dependence: Secondary | ICD-10-CM | POA: Diagnosis not present

## 2016-08-22 DIAGNOSIS — I10 Essential (primary) hypertension: Secondary | ICD-10-CM | POA: Diagnosis not present

## 2016-08-22 DIAGNOSIS — E7800 Pure hypercholesterolemia, unspecified: Secondary | ICD-10-CM | POA: Diagnosis not present

## 2016-08-22 DIAGNOSIS — N200 Calculus of kidney: Secondary | ICD-10-CM | POA: Diagnosis not present

## 2016-08-22 DIAGNOSIS — H269 Unspecified cataract: Secondary | ICD-10-CM | POA: Diagnosis present

## 2016-08-22 DIAGNOSIS — M109 Gout, unspecified: Secondary | ICD-10-CM | POA: Diagnosis not present

## 2016-08-22 DIAGNOSIS — R310 Gross hematuria: Secondary | ICD-10-CM | POA: Diagnosis present

## 2016-08-22 HISTORY — PX: NEPHROLITHOTOMY: SHX5134

## 2016-08-22 LAB — GLUCOSE, CAPILLARY: Glucose-Capillary: 123 mg/dL — ABNORMAL HIGH (ref 65–99)

## 2016-08-22 SURGERY — NEPHROLITHOTOMY PERCUTANEOUS SECOND LOOK
Anesthesia: General | Laterality: Left

## 2016-08-22 MED ORDER — PROPOFOL 10 MG/ML IV BOLUS
INTRAVENOUS | Status: AC
  Filled 2016-08-22: qty 20

## 2016-08-22 MED ORDER — HYDROMORPHONE HCL 2 MG/ML IJ SOLN
INTRAMUSCULAR | Status: AC
  Filled 2016-08-22: qty 1

## 2016-08-22 MED ORDER — PROPOFOL 10 MG/ML IV BOLUS
INTRAVENOUS | Status: DC | PRN
  Administered 2016-08-22: 200 mg via INTRAVENOUS

## 2016-08-22 MED ORDER — CEFAZOLIN SODIUM-DEXTROSE 2-4 GM/100ML-% IV SOLN
INTRAVENOUS | Status: AC
  Filled 2016-08-22: qty 100

## 2016-08-22 MED ORDER — SUGAMMADEX SODIUM 200 MG/2ML IV SOLN
INTRAVENOUS | Status: DC | PRN
  Administered 2016-08-22: 170 mg via INTRAVENOUS

## 2016-08-22 MED ORDER — ROCURONIUM BROMIDE 50 MG/5ML IV SOSY
PREFILLED_SYRINGE | INTRAVENOUS | Status: AC
  Filled 2016-08-22: qty 5

## 2016-08-22 MED ORDER — SUCCINYLCHOLINE CHLORIDE 200 MG/10ML IV SOSY
PREFILLED_SYRINGE | INTRAVENOUS | Status: DC | PRN
  Administered 2016-08-22: 120 mg via INTRAVENOUS

## 2016-08-22 MED ORDER — FENTANYL CITRATE (PF) 100 MCG/2ML IJ SOLN
INTRAMUSCULAR | Status: DC | PRN
  Administered 2016-08-22: 100 ug via INTRAVENOUS

## 2016-08-22 MED ORDER — EPHEDRINE 5 MG/ML INJ
INTRAVENOUS | Status: AC
  Filled 2016-08-22: qty 10

## 2016-08-22 MED ORDER — ACETAMINOPHEN 160 MG/5ML PO SOLN: 325.0000 mg | ORAL | Status: DC | PRN

## 2016-08-22 MED ORDER — ONDANSETRON HCL 4 MG/2ML IJ SOLN
INTRAMUSCULAR | Status: AC
  Filled 2016-08-22: qty 2

## 2016-08-22 MED ORDER — ONDANSETRON HCL 4 MG/2ML IJ SOLN: 4.0000 mg | Freq: Once | INTRAMUSCULAR | Status: DC | PRN

## 2016-08-22 MED ORDER — SODIUM CHLORIDE 0.9 % IR SOLN
Status: DC | PRN
  Administered 2016-08-22: 6000 mL

## 2016-08-22 MED ORDER — FENTANYL CITRATE (PF) 100 MCG/2ML IJ SOLN: 25.0000 ug | INTRAMUSCULAR | Status: DC | PRN

## 2016-08-22 MED ORDER — EPHEDRINE SULFATE-NACL 50-0.9 MG/10ML-% IV SOSY
PREFILLED_SYRINGE | INTRAVENOUS | Status: DC | PRN
  Administered 2016-08-22: 10 mg via INTRAVENOUS
  Administered 2016-08-22: 5 mg via INTRAVENOUS

## 2016-08-22 MED ORDER — MEPERIDINE HCL 50 MG/ML IJ SOLN: 6.2500 mg | INTRAMUSCULAR | Status: DC | PRN

## 2016-08-22 MED ORDER — ACETAMINOPHEN 325 MG PO TABS: 325.0000 mg | ORAL_TABLET | ORAL | Status: DC | PRN

## 2016-08-22 MED ORDER — LIDOCAINE 2% (20 MG/ML) 5 ML SYRINGE
INTRAMUSCULAR | Status: AC
  Filled 2016-08-22: qty 5

## 2016-08-22 MED ORDER — OXYCODONE HCL 5 MG PO TABS
5.0000 mg | ORAL_TABLET | Freq: Four times a day (QID) | ORAL | 0 refills | Status: DC | PRN
Start: 2016-08-22 — End: 2018-12-30

## 2016-08-22 MED ORDER — SUCCINYLCHOLINE CHLORIDE 200 MG/10ML IV SOSY
PREFILLED_SYRINGE | INTRAVENOUS | Status: AC
  Filled 2016-08-22: qty 10

## 2016-08-22 MED ORDER — OXYBUTYNIN CHLORIDE 5 MG PO TABS: 5.0000 mg | ORAL_TABLET | Freq: Three times a day (TID) | ORAL | 1 refills | Status: DC | PRN

## 2016-08-22 MED ORDER — LACTATED RINGERS IV SOLN
INTRAVENOUS | Status: DC
  Administered 2016-08-22 (×2): via INTRAVENOUS

## 2016-08-22 MED ORDER — ACETAMINOPHEN 10 MG/ML IV SOLN: 1000.0000 mg | Freq: Once | INTRAVENOUS | Status: DC | PRN

## 2016-08-22 MED ORDER — ROCURONIUM BROMIDE 10 MG/ML (PF) SYRINGE
PREFILLED_SYRINGE | INTRAVENOUS | Status: DC | PRN
  Administered 2016-08-22: 50 mg via INTRAVENOUS

## 2016-08-22 MED ORDER — LIDOCAINE 2% (20 MG/ML) 5 ML SYRINGE
INTRAMUSCULAR | Status: DC | PRN
  Administered 2016-08-22: 100 mg via INTRAVENOUS

## 2016-08-22 MED ORDER — FENTANYL CITRATE (PF) 100 MCG/2ML IJ SOLN
INTRAMUSCULAR | Status: AC
  Filled 2016-08-22: qty 2

## 2016-08-22 MED ORDER — IOHEXOL 300 MG/ML  SOLN
INTRAMUSCULAR | Status: DC | PRN
  Administered 2016-08-22: 20 mL via URETHRAL

## 2016-08-22 SURGICAL SUPPLY — 44 items
APL SKNCLS STERI-STRIP NONHPOA (GAUZE/BANDAGES/DRESSINGS) ×2
BAG URINE DRAINAGE (UROLOGICAL SUPPLIES) ×1 IMPLANT
BASKET STONE NCOMPASS (UROLOGICAL SUPPLIES) ×1 IMPLANT
BASKET ZERO TIP NITINOL 2.4FR (BASKET) IMPLANT
BENZOIN TINCTURE PRP APPL 2/3 (GAUZE/BANDAGES/DRESSINGS) ×6 IMPLANT
BLADE SURG 15 STRL LF DISP TIS (BLADE) ×1 IMPLANT
BLADE SURG 15 STRL SS (BLADE) ×2
BSKT STON RTRVL ZERO TP 2.4FR (BASKET)
CATCHER STONE W/TUBE ADAPTER (UROLOGICAL SUPPLIES) IMPLANT
CATH FOLEY 2W COUNCIL 20FR 5CC (CATHETERS) IMPLANT
CATH ROBINSON RED A/P 20FR (CATHETERS) IMPLANT
CATH URET 5FR 28IN OPEN ENDED (CATHETERS) IMPLANT
CATH URET DUAL LUMEN 6-10FR 50 (CATHETERS) ×1 IMPLANT
CATH X-FORCE N30 NEPHROSTOMY (TUBING) ×1 IMPLANT
DRAPE C-ARM 42X120 X-RAY (DRAPES) ×2 IMPLANT
DRAPE LINGEMAN PERC (DRAPES) ×2 IMPLANT
DRAPE SURG IRRIG POUCH 19X23 (DRAPES) ×2 IMPLANT
DRSG PAD ABDOMINAL 8X10 ST (GAUZE/BANDAGES/DRESSINGS) ×4 IMPLANT
DRSG TEGADERM 8X12 (GAUZE/BANDAGES/DRESSINGS) ×2 IMPLANT
EXTRACTOR STONE NITINOL NGAGE (UROLOGICAL SUPPLIES) ×1 IMPLANT
FIBER LASER FLEXIVA 1000 (UROLOGICAL SUPPLIES) IMPLANT
FIBER LASER FLEXIVA 550 (UROLOGICAL SUPPLIES) IMPLANT
FIBER LASER TRAC TIP (UROLOGICAL SUPPLIES) IMPLANT
GAUZE SPONGE 4X4 12PLY STRL (GAUZE/BANDAGES/DRESSINGS) ×2 IMPLANT
GLOVE SURG SS PI 8.0 STRL IVOR (GLOVE) IMPLANT
GOWN STRL REUS W/TWL XL LVL3 (GOWN DISPOSABLE) ×3 IMPLANT
GUIDEWIRE AMPLAZ .035X145 (WIRE) ×4 IMPLANT
KIT BASIN OR (CUSTOM PROCEDURE TRAY) ×2 IMPLANT
MANIFOLD NEPTUNE II (INSTRUMENTS) ×2 IMPLANT
NS IRRIG 1000ML POUR BTL (IV SOLUTION) ×2 IMPLANT
PACK BASIC VI WITH GOWN DISP (CUSTOM PROCEDURE TRAY) ×2 IMPLANT
PACK CYSTO (CUSTOM PROCEDURE TRAY) ×2 IMPLANT
PROBE LITHOCLAST ULTRA 3.8X403 (UROLOGICAL SUPPLIES) ×1 IMPLANT
PROBE PNEUMATIC 1.0MMX570MM (UROLOGICAL SUPPLIES) IMPLANT
SET IRRIG Y TYPE TUR BLADDER L (SET/KITS/TRAYS/PACK) ×1 IMPLANT
SPONGE LAP 4X18 X RAY DECT (DISPOSABLE) ×2 IMPLANT
STENT CONTOUR 6FRX24X.038 (STENTS) ×1 IMPLANT
STONE CATCHER W/TUBE ADAPTER (UROLOGICAL SUPPLIES) ×2 IMPLANT
SUT SILK 2 0 30  PSL (SUTURE) ×1
SUT SILK 2 0 30 PSL (SUTURE) ×1 IMPLANT
SYR 10ML LL (SYRINGE) ×2 IMPLANT
SYR 20CC LL (SYRINGE) ×4 IMPLANT
TOWEL OR NON WOVEN STRL DISP B (DISPOSABLE) ×2 IMPLANT
TUBING CONNECTING 10 (TUBING) ×4 IMPLANT

## 2016-08-22 NOTE — Anesthesia Procedure Notes (Signed)
Procedure Name: Intubation Date/Time: 08/22/2016 1:49 PM Performed by: Lind Covert Pre-anesthesia Checklist: Patient identified, Emergency Drugs available, Suction available and Patient being monitored Patient Re-evaluated:Patient Re-evaluated prior to inductionOxygen Delivery Method: Circle system utilized Preoxygenation: Pre-oxygenation with 100% oxygen Intubation Type: IV induction Laryngoscope Size: 4 and Mac Grade View: Grade I Tube type: Oral Tube size: 7.0 mm Number of attempts: 1 Airway Equipment and Method: Stylet Placement Confirmation: ETT inserted through vocal cords under direct vision,  positive ETCO2 and breath sounds checked- equal and bilateral Secured at: 22 cm Tube secured with: Tape

## 2016-08-22 NOTE — H&P (View-Only) (Signed)
1 Day Post-Op  Subjective: She is doing well post left PCNL but had some foley issue with obstruction and required a change.  She has minimal pain and the urine is light pink.   Her Hgb is down to 10.5.   CT this AM shows residual LLP and LMP stones.  ROS:  Review of Systems  Constitutional: Negative for fever.  Gastrointestinal: Negative for nausea.  Genitourinary: Positive for flank pain (decreased).    Anti-infectives: Anti-infectives    Start     Dose/Rate Route Frequency Ordered Stop   08/22/16 1215  ceFAZolin (ANCEF) IVPB 2g/100 mL premix     2 g 200 mL/hr over 30 Minutes Intravenous Every 8 hours 08/21/16 0820     08/20/16 0830  ceFAZolin (ANCEF) IVPB 2g/100 mL premix  Status:  Discontinued     2 g 200 mL/hr over 30 Minutes Intravenous To Radiology 08/20/16 0730 08/20/16 0738   08/20/16 0730  ceFAZolin (ANCEF) IVPB 2g/100 mL premix  Status:  Discontinued     2 g 200 mL/hr over 30 Minutes Intravenous 30 min pre-op 08/20/16 0730 08/20/16 2956      Current Facility-Administered Medications  Medication Dose Route Frequency Provider Last Rate Last Dose  . 0.45 % NaCl with KCl 20 mEq / L infusion   Intravenous Continuous Bjorn Pippin, MD 100 mL/hr at 08/21/16 0114    . acetaminophen (TYLENOL) tablet 1,000 mg  1,000 mg Oral BID Bjorn Pippin, MD   1,000 mg at 08/20/16 2217  . acetaminophen (TYLENOL) tablet 650 mg  650 mg Oral Q4H PRN Bjorn Pippin, MD      . amLODipine (NORVASC) tablet 5 mg  5 mg Oral Daily Bjorn Pippin, MD      . bisacodyl (DULCOLAX) suppository 10 mg  10 mg Rectal Daily PRN Bjorn Pippin, MD      . Melene Muller ON 08/22/2016] ceFAZolin (ANCEF) IVPB 2g/100 mL premix  2 g Intravenous Q8H Bjorn Pippin, MD      . cholecalciferol (VITAMIN D) tablet 1,000 Units  1,000 Units Oral Daily Bjorn Pippin, MD   1,000 Units at 08/20/16 1804  . ferrous sulfate tablet 325 mg  325 mg Oral Q M,W,F Bjorn Pippin, MD      . HYDROmorphone (DILAUDID) injection 0.5-1 mg  0.5-1 mg Intravenous Q2H PRN Bjorn Pippin,  MD   0.5 mg at 08/21/16 0225  . latanoprost (XALATAN) 0.005 % ophthalmic solution 1 drop  1 drop Both Eyes QHS Bjorn Pippin, MD   1 drop at 08/20/16 2216  . lisinopril (PRINIVIL,ZESTRIL) tablet 20 mg  20 mg Oral BID Bjorn Pippin, MD   20 mg at 08/20/16 2216  . niacin (NIASPAN) CR tablet 500 mg  500 mg Oral BID Bjorn Pippin, MD   500 mg at 08/20/16 2216  . ondansetron (ZOFRAN) injection 4 mg  4 mg Intravenous Q4H PRN Bjorn Pippin, MD      . oxyCODONE (Oxy IR/ROXICODONE) immediate release tablet 5 mg  5 mg Oral Q4H PRN Bjorn Pippin, MD      . potassium chloride (K-DUR,KLOR-CON) CR tablet 10 mEq  10 mEq Oral QODAY Bjorn Pippin, MD      . potassium chloride SA (K-DUR,KLOR-CON) CR tablet 40 mEq  40 mEq Oral Rebecca Eaton, MD   40 mEq at 08/20/16 1804  . senna-docusate (Senokot-S) tablet 1 tablet  1 tablet Oral QHS PRN Bjorn Pippin, MD      . sodium phosphate (FLEET) 7-19 GM/118ML enema 1 enema  1 enema Rectal  Once PRN Bjorn Pippin, MD         Objective: Vital signs in last 24 hours: Temp:  [97.5 F (36.4 C)-98.3 F (36.8 C)] 97.8 F (36.6 C) (04/11 0536) Pulse Rate:  [63-89] 79 (04/11 0536) Resp:  [14-25] 16 (04/11 0536) BP: (138-173)/(64-86) 142/78 (04/11 0536) SpO2:  [96 %-100 %] 100 % (04/11 0536) Weight:  [85 kg (187 lb 6.3 oz)] 85 kg (187 lb 6.3 oz) (04/10 1458)  Intake/Output from previous day: 04/10 0701 - 04/11 0700 In: 2940 [P.O.:360; I.V.:2580] Out: 1295 [Urine:1195; Blood:100] Intake/Output this shift: No intake/output data recorded.   Physical Exam  Constitutional: She is well-developed, well-nourished, and in no distress.  Vitals reviewed.   Lab Results:   Recent Labs  08/20/16 1656 08/21/16 0602  HGB 11.2* 10.5*  HCT 35.9* 34.3*   BMET No results for input(s): NA, K, CL, CO2, GLUCOSE, BUN, CREATININE, CALCIUM in the last 72 hours. PT/INR  Recent Labs  08/20/16 0750  LABPROT 12.7  INR 0.95   ABG No results for input(s): PHART, HCO3 in the last 72 hours.  Invalid  input(s): PCO2, PO2  Studies/Results: Dg C-arm 61-120 Min-no Report  Result Date: 08/20/2016 Fluoroscopy was utilized by the requesting physician.  No radiographic interpretation.   Ir Ureteral Stent Left New Access W/o Sep Nephrostomy Cath  Result Date: 08/20/2016 INDICATION: 66 year old female with a history of left renal staghorn calculus EXAM: IR URETURAL STENT LEFT NEW ACCESS W/O SEP NEPHROSTOMY CATH COMPARISON:  CT 08/08/2016 MEDICATIONS: 2.0 g Ancef; The antibiotic was administered in an appropriate time frame prior to skin puncture. ANESTHESIA/SEDATION: Fentanyl 100 mcg IV; Versed 4.0 mg IV Moderate Sedation Time:  73 The patient was continuously monitored during the procedure by the interventional radiology nurse under my direct supervision. CONTRAST:  50 cc - administered into the collecting system(s) FLUOROSCOPY TIME:  Fluoroscopy Time: 14 minutes 6 seconds (869.3 mGy). COMPLICATIONS: None PROCEDURE: Informed written consent was obtained from the patient after a thorough discussion of the procedural risks, benefits and alternatives. All questions were addressed. Maximal Sterile Barrier Technique was utilized including caps, mask, sterile gowns, sterile gloves, sterile drape, hand hygiene and skin antiseptic. A timeout was performed prior to the initiation of the procedure. Patient positioned prone position on the fluoroscopy table. Ultrasound survey of the left flank was performed with images stored and sent to PACs. The patient was then prepped and draped in the usual sterile fashion. 1% lidocaine was used to anesthetize the skin and subcutaneous tissues for local anesthesia. A Chiba needle was then used to access the superior aspect of the collecting system within AP approach under fluoroscopic guidance. The target was the stone cast of the superior collecting system. Small amount of contrast was infused through the needle, confirming location within the collecting system. With contrast infused  around the casting stone, and inferior posterior calyx was selected for targeting. A second 21 gauge needle was advanced under fluoroscopy into the selected calyx/infundibulum. Once we confirmed location of the needle tip with contrast infusion, an 014 wire was placed. An Accustick system was then advanced over the wire into the collecting system under fluoroscopy. The metal stiffener and inner dilator were removed. Combination of Glidewire and an angled 4 French catheter were used to navigate the Glidewire into the ureter, ultimately with the Glidewire and glide cath in the urinary bladder. A 035 Coons wire was then placed through the glide cath. A 5 French catheter was then advanced over the 035 wire into  the urinary bladder. Small amount of contrast confirmed position of the catheter. Catheter was sutured to the skin. Patient tolerated the procedure well and remained hemodynamically stable throughout. No complications were encountered and no significant blood loss encountered IMPRESSION: Status post placement of right percutaneous nephroureteral tube for operative nephrolithotomy. Signed, Yvone Neu. Loreta Ave, DO [facility] [place] [facility] Electronically Signed   By: [name] D.O.   On: 08/20/2016 11:28   CT from this am reviewed.   Report pending but residual stones noted in mid and lower pole.   Assessment and Plan: Left staghorn stone doing well post first stage PCNL.    She has residual lower and mid pole stones and I will proceed with the second stage procedure on 4/12 as planned.       LOS: 0 days    Anner Crete 08/21/2016 [phone] ID: Paralee Cancel, female   DOB: Sep 29, 1950, 66 y.o.   MRN: [id number]

## 2016-08-22 NOTE — Anesthesia Postprocedure Evaluation (Addendum)
Anesthesia Post Note  Patient: Gwendolyn Snyder  Procedure(s) Performed: Procedure(s) (LRB): LEFT NEPHROLITHOTOMY PERCUTANEOUS SECOND LOOK (Left)  Patient location during evaluation: PACU Anesthesia Type: General Level of consciousness: awake Pain management: pain level controlled Respiratory status: spontaneous breathing Cardiovascular status: stable Postop Assessment: no signs of nausea or vomiting Anesthetic complications: no        Last Vitals:  Vitals:   08/22/16 1530 08/22/16 1545  BP: (!) 145/73 (!) 142/67  Pulse: 78 68  Resp: 18 12  Temp:  36.5 C    Last Pain:  Vitals:   08/22/16 1545  TempSrc:   PainSc: 0-No pain   Pain Goal: Patients Stated Pain Goal: 3 (08/22/16 0845)               Amely Voorheis JR,[name] Susann Givens

## 2016-08-22 NOTE — Discharge Instructions (Signed)
Ureteral Stent Implantation, Care After Refer to this sheet in the next few weeks. These instructions provide you with information about caring for yourself after your procedure. Your health care provider may also give you more specific instructions. Your treatment has been planned according to current medical practices, but problems sometimes occur. Call your health care provider if you have any problems or questions after your procedure. What can I expect after the procedure? After the procedure, it is common to have:  Nausea.  Mild pain when you urinate. You may feel this pain in your lower back or lower abdomen. Pain should stop within a few minutes after you urinate. This may last for up to 1 week.  A small amount of blood in your urine for several days. Follow these instructions at home:   Medicines   Take over-the-counter and prescription medicines only as told by your health care provider.  If you were prescribed an antibiotic medicine, take it as told by your health care provider. Do not stop taking the antibiotic even if you start to feel better.  Do not drive for 24 hours if you received a sedative.  Do not drive or operate heavy machinery while taking prescription pain medicines. Activity   Return to your normal activities as told by your health care provider. Ask your health care provider what activities are safe for you.  Do not lift anything that is heavier than 10 lb (4.5 kg). Follow this limit for 1 week after your procedure, or for as long as told by your health care provider. General instructions   Watch for any blood in your urine. Call your health care provider if the amount of blood in your urine increases.  If you have a catheter:  Follow instructions from your health care provider about taking care of your catheter and collection bag.  Do not take baths, swim, or use a hot tub until your health care provider approves.  Drink enough fluid to keep your urine  clear or pale yellow.  Keep all follow-up visits as told by your health care provider. This is important. Contact a health care provider if:  You have pain that gets worse or does not get better with medicine, especially pain when you urinate.  You have difficulty urinating.  You feel nauseous or you vomit repeatedly during a period of more than 2 days after the procedure. Get help right away if:  Your urine is dark red or has blood clots in it.  You are leaking urine (have incontinence).  The end of the stent comes out of your urethra.  You cannot urinate.  You have sudden, sharp, or severe pain in your abdomen or lower back.  You have a fever. This information is not intended to replace advice given to you by your health care provider. Make sure you discuss any questions you have with your health care provider. Document Released: 12/30/2012 Document Revised: 10/05/2015 Document Reviewed: 11/11/2014 Elsevier Interactive Patient Education  2017 [employer] Percutaneous Nephrolithotomy, Care After Refer to this sheet in the next few weeks. These instructions provide you with information about caring for yourself after your procedure. Your health care provider may also give you more specific instructions. Your treatment has been planned according to current medical practices, but problems sometimes occur. Call your health care provider if you have any problems or questions after your procedure. What can I expect after the procedure? After the procedure, it is common to have:  Soreness or pain.  Fatigue.  Some blood in your urine for a few days. Follow these instructions at home: Incision care    Follow instructions from your health care provider about how to take care of your cut from surgery (incision). Make sure you:  Wash your hands with soap and water before you change your bandage (dressing). If soap and water are not available, use hand sanitizer.  Change your  dressing as told by your health care provider.  Leave stitches (sutures), skin glue, or adhesive strips in place. These skin closures may need to stay in place for 2 weeks or longer. If adhesive strip edges start to loosen and curl up, you may trim the loose edges. Do not remove adhesive strips completely unless your health care provider tells you to do that.  Check your incision area every day for signs of infection. Check for:  More redness, swelling, or pain.  More fluid or blood.  Warmth.  Pus or a bad smell. Activity   Avoid strenuous activities for as long as told by your health care provider.  Return to your normal activities as told by your health care provider. Ask your health care provider what activities are safe for you. General instructions   Take over-the-counter and prescription medicines only as told by your health care provider.  Do not drive or operate heavy machinery while taking prescription pain medicine.  Keep all follow-up visits as told by your health care provider. This is important.  You may have been sent home with a catheter or kidney drain tube. If so, carefully follow your health care providers instructions on how to take care of your catheter or kidney drain tube. Contact a health care provider if:  You have a fever.  You have more redness, swelling, or pain around your incision.  You have more fluid or blood coming from your incision.  Your incision feels warm to the touch.  You have pus or a bad smell coming from your incision.  You lose your appetite.  You feel nauseous or you vomit. Get help right away if:  You have blood clots in your urine.  You cannot urinate.  You have chest pain or trouble breathing. This information is not intended to replace advice given to you by your health care provider. Make sure you discuss any questions you have with your health care provider. Document Released: 05/26/2015 Document Revised: 10/05/2015  Document Reviewed: 10/17/2014 Elsevier Interactive Patient Education  2017 [employer].

## 2016-08-22 NOTE — Transfer of Care (Signed)
Immediate Anesthesia Transfer of Care Note  Patient: Gwendolyn Snyder  Procedure(s) Performed: Procedure(s): LEFT NEPHROLITHOTOMY PERCUTANEOUS SECOND LOOK (Left)  Patient Location: PACU  Anesthesia Type:General  Level of Consciousness:  sedated, patient cooperative and responds to stimulation  Airway & Oxygen Therapy:Patient Spontanous Breathing and Patient connected to face mask oxgen  Post-op Assessment:  Report given to PACU RN and Post -op Vital signs reviewed and stable  Post vital signs:  Reviewed and stable  Last Vitals:  Vitals:   08/21/16 2053 08/22/16 0414  BP: (!) 145/86 (!) 167/68  Pulse: 85 72  Resp: 17 16  Temp: 36.8 C 36.8 C    Complications: No apparent anesthesia complications

## 2016-08-22 NOTE — Brief Op Note (Signed)
08/20/2016 - 08/22/2016  2:53 PM  PATIENT:  Gwendolyn Snyder  66 y.o. female  PRE-OPERATIVE DIAGNOSIS:  LEFT STAGEHORN STONE  POST-OPERATIVE DIAGNOSIS:  LEFT STAGEHORN STONE  PROCEDURE:  Procedure(s): LEFT NEPHROLITHOTOMY PERCUTANEOUS SECOND LOOK (Left) <2cm LEFT ANTEGRADE NEPHROSTOGRAM EXISTING TRACT  SURGEON:  Surgeon(s) and Role:    * Bjorn Pippin, MD - Primary  PHYSICIAN ASSISTANT:   ASSISTANTS: none   ANESTHESIA:   general  EBL:  Total I/O In: 1725 [I.V.:1725] Out: 550 [Urine:550]  BLOOD ADMINISTERED:none  DRAINS: Urinary Catheter (Foley) and left 6 x 24 JJ stent   LOCAL MEDICATIONS USED:  NONE  SPECIMEN:  Source of Specimen:  stone fragments  DISPOSITION OF SPECIMEN:  to patient  COUNTS:  YES  TOURNIQUET:  * No tourniquets in log *  DICTATION: .Other Dictation: Dictation Number [id number]  PLAN OF CARE: Admit for overnight observation  PATIENT DISPOSITION:  PACU - hemodynamically stable.   Delay start of Pharmacological VTE agent (>24hrs) due to surgical blood loss or risk of bleeding: yes

## 2016-08-22 NOTE — Anesthesia Preprocedure Evaluation (Addendum)
Anesthesia Evaluation  Patient identified by MRN, date of birth, ID band Patient awake    Reviewed: Allergy & Precautions, H&P , NPO status , Patient's Chart, lab work & pertinent test results  Airway Mallampati: II  TM Distance: >3 FB Neck ROM: full    Dental no notable dental hx.    Pulmonary former smoker,    Pulmonary exam normal breath sounds clear to auscultation       Cardiovascular hypertension, Pt. on medications Normal cardiovascular exam Rhythm:regular Rate:Normal     Neuro/Psych    GI/Hepatic   Endo/Other  diabetes  Renal/GU      Musculoskeletal   Abdominal (+) + obese,   Peds  Hematology   Anesthesia Other Findings   Reproductive/Obstetrics                             Anesthesia Physical  Anesthesia Plan  ASA: II  Anesthesia Plan: General   Post-op Pain Management:    Induction: Intravenous  Airway Management Planned: Oral ETT  Additional Equipment:   Intra-op Plan:   Post-operative Plan: Extubation in OR  Informed Consent: I have reviewed the patients History and Physical, chart, labs and discussed the procedure including the risks, benefits and alternatives for the proposed anesthesia with the patient or authorized representative who has indicated his/her understanding and acceptance.   Dental Advisory Given  Plan Discussed with: CRNA and Surgeon  Anesthesia Plan Comments: ( )       Anesthesia Quick Evaluation

## 2016-08-22 NOTE — Care Management Note (Signed)
Case Management Note  Patient Details  Name: NAILAH LUEPKE MRN: [id number] Date of Birth: 1951-01-25  Subjective/Objective:65 y/o f admitted w/Nephrolithiasis.From home. 1st stage PCNL done.  stage PCNL today. No CM needs.                    Action/Plan:d/c home.   Expected Discharge Date:                  Expected Discharge Plan:  Home/Self Care  In-House Referral:     Discharge planning Services  CM Consult  Post Acute Care Choice:    Choice offered to:     DME Arranged:    DME Agency:     HH Arranged:    HH Agency:     Status of Service:  In process, will continue to follow  If discussed at Long Length of Stay Meetings, dates discussed:    Additional Comments:  Lanier Clam, RN 08/22/2016, 11:22 AM

## 2016-08-22 NOTE — Interval H&P Note (Signed)
History and Physical Interval Note:  She is doing well and is ready for her second look.   08/22/2016 1:39 PM  Gwendolyn Snyder  has presented today for surgery, with the diagnosis of LEFT STAGEHORN STONE  The various methods of treatment have been discussed with the patient and family. After consideration of risks, benefits and other options for treatment, the patient has consented to  Procedure(s): LEFT NEPHROLITHOTOMY PERCUTANEOUS SECOND LOOK (Left) as a surgical intervention .  The patient's history has been reviewed, patient examined, no change in status, stable for surgery.  I have reviewed the patient's chart and labs.  Questions were answered to the patient's satisfaction.     Gloria Ricardo J

## 2016-08-23 NOTE — Discharge Summary (Signed)
Patient ID: Gwendolyn Snyder MRN: [id number] DOB/AGE: [birth year] 66 y.o.  Admit date: 08/20/2016 Discharge date: 08/23/2016  Primary Care Physician:  Colette Ribas, MD  Discharge Diagnoses:  Renal calculus  Consults:  None   Discharge Medications: Allergies as of 08/23/2016      Reactions   Aspirin Other (See Comments)   Makes ears ring and heart beat fast   Codeine Other (See Comments)   Hard to wake up      Medication List    TAKE these medications   acetaminophen 650 MG CR tablet Commonly known as:  TYLENOL Take 1,300 mg by mouth 2 (two) times daily.   amLODipine 5 MG tablet Commonly known as:  NORVASC Take 5 mg by mouth daily.   cholecalciferol 1000 units tablet Commonly known as:  VITAMIN D Take 1,000 Units by mouth daily.   ferrous sulfate 325 (65 FE) MG tablet Take 325 mg by mouth every Monday, Wednesday, and Friday. In the morning   latanoprost 0.005 % ophthalmic solution Commonly known as:  XALATAN Place 1 drop into both eyes at bedtime.   lisinopril 20 MG tablet Commonly known as:  PRINIVIL,ZESTRIL Take 20 mg by mouth 2 (two) times daily.   NIACIN FLUSH FREE 500 MG Caps Generic drug:  Inositol Niacinate Take 500 mg by mouth 2 (two) times daily.   oxybutynin 5 MG tablet Commonly known as:  DITROPAN Take 1 tablet (5 mg total) by mouth every 8 (eight) hours as needed for bladder spasms.   oxyCODONE 5 MG immediate release tablet Commonly known as:  Oxy IR/ROXICODONE Take 1 tablet (5 mg total) by mouth every 6 (six) hours as needed for moderate pain.   potassium chloride SA 20 MEQ tablet Commonly known as:  K-DUR,KLOR-CON Take 20-40 mEq by mouth See admin instructions. Alternate between 1 tablet (20 meq) & 2 tablets (40 meq) every other day        Significant Diagnostic Studies:  Ct Abdomen Pelvis Wo Contrast  Result Date: 08/21/2016 CLINICAL DATA:  Evaluate for residual calculi status post nephrolithotomy. EXAM: CT ABDOMEN AND PELVIS  WITHOUT CONTRAST TECHNIQUE: Multidetector CT imaging of the abdomen and pelvis was performed following the standard protocol without IV contrast. COMPARISON:  08/08/2016 FINDINGS: Lower chest: Large hiatal hernia. Subsegmental atelectasis noted within the posteromedial lung bases. Hepatobiliary: No focal liver abnormality is seen. No gallstones, gallbladder wall thickening, or biliary dilatation. Pancreas: Unremarkable. No pancreatic ductal dilatation or surrounding inflammatory changes. Spleen: 2.8 cm low-attenuation structure within the spleen is unchanged from previous exam. Also unchanged is 11 mm low-attenuation structure within the inferior spleen, image 20 of series 2. Adrenals/Urinary Tract: The adrenal glands are normal. Right kidney is unremarkable left-sided nephroureteral stent and percutaneous nephrostomy tube identified. Residual stone fragments are noted within the upper and lower pole of the left kidney. The largest is in the inferior pole measuring 1.3 cm, image 33 of series 2. Stone fragment within the proximal left ureter is identified measuring 5 mm, image 39 of series 2. Urinary bladder is collapsed around a Foley catheter balloon. Stomach/Bowel: Large hiatal hernia. Unremarkable appearance of the small bowel. No dilated loops of small or large bowel identified. Vascular/Lymphatic: Aortic atherosclerosis. No enlarged enlarged upper abdominal lymph nodes. No pelvic or inguinal adenopathy. Reproductive: Status post hysterectomy. No adnexal masses. Other: No abdominal wall hernia or abnormality. No abdominopelvic ascites. Musculoskeletal: Status post bilateral hip arthroplasty. There is degenerative disc disease noted within the lumbar spine. IMPRESSION: 1. Multiple stone fragments are  identified within the left renal collecting system and proximal left ureter status post percutaneous 2. Large hiatal hernia 3. Aortic atherosclerosis. Electronically Signed   By: Signa Kell M.D.   On: 08/21/2016  09:04   Dg C-arm 61-120 Min-no Report  Result Date: 08/20/2016 Fluoroscopy was utilized by the requesting physician.  No radiographic interpretation.   Ir Ureteral Stent Left New Access W/o Sep Nephrostomy Cath  Result Date: 08/20/2016 INDICATION: 66 year old female with a history of left renal staghorn calculus EXAM: IR URETURAL STENT LEFT NEW ACCESS W/O SEP NEPHROSTOMY CATH COMPARISON:  CT 08/08/2016 MEDICATIONS: 2.0 g Ancef; The antibiotic was administered in an appropriate time frame prior to skin puncture. ANESTHESIA/SEDATION: Fentanyl 100 mcg IV; Versed 4.0 mg IV Moderate Sedation Time:  73 The patient was continuously monitored during the procedure by the interventional radiology nurse under my direct supervision. CONTRAST:  50 cc - administered into the collecting system(s) FLUOROSCOPY TIME:  Fluoroscopy Time: 14 minutes 6 seconds (869.3 mGy). COMPLICATIONS: None PROCEDURE: Informed written consent was obtained from the patient after a thorough discussion of the procedural risks, benefits and alternatives. All questions were addressed. Maximal Sterile Barrier Technique was utilized including caps, mask, sterile gowns, sterile gloves, sterile drape, hand hygiene and skin antiseptic. A timeout was performed prior to the initiation of the procedure. Patient positioned prone position on the fluoroscopy table. Ultrasound survey of the left flank was performed with images stored and sent to PACs. The patient was then prepped and draped in the usual sterile fashion. 1% lidocaine was used to anesthetize the skin and subcutaneous tissues for local anesthesia. A Chiba needle was then used to access the superior aspect of the collecting system within AP approach under fluoroscopic guidance. The target was the stone cast of the superior collecting system. Small amount of contrast was infused through the needle, confirming location within the collecting system. With contrast infused around the casting stone, and  inferior posterior calyx was selected for targeting. A second 21 gauge needle was advanced under fluoroscopy into the selected calyx/infundibulum. Once we confirmed location of the needle tip with contrast infusion, an 014 wire was placed. An Accustick system was then advanced over the wire into the collecting system under fluoroscopy. The metal stiffener and inner dilator were removed. Combination of Glidewire and an angled 4 French catheter were used to navigate the Glidewire into the ureter, ultimately with the Glidewire and glide cath in the urinary bladder. A 035 Coons wire was then placed through the glide cath. A 5 French catheter was then advanced over the 035 wire into the urinary bladder. Small amount of contrast confirmed position of the catheter. Catheter was sutured to the skin. Patient tolerated the procedure well and remained hemodynamically stable throughout. No complications were encountered and no significant blood loss encountered IMPRESSION: Status post placement of right percutaneous nephroureteral tube for operative nephrolithotomy. Signed, Yvone Neu. Loreta Ave, DO [facility] Electronically Signed   By: [name] D.O.   On: 08/20/2016 11:28    Brief H and P: For complete details please refer to admission H and P, but in brief the pt is admitted for mgmt of a staghorn stone  Hospital Course:  Active Problems:   Nephrolithiasis   Day of Discharge BP (!) 144/65 (BP Location: Left Arm)   Pulse 79   Temp 97.5 F (36.4 C) (Oral)   Resp 18   Ht  (1.6 m)   Wt 85 kg (187 lb 6.3  oz)   SpO2 97%   BMI 33.19 kg/m   Results for orders placed or performed during the hospital encounter of 08/20/16 (from the past 24 hour(s))  Glucose, capillary     Status: Abnormal   Collection Time: 08/22/16  3:22 PM  Result Value Ref Range   Glucose-Capillary 123 (H) 65 - 99 mg/dL    Physical Exam: General: Alert and awake oriented  x3 not in any acute distress. HEENT: anicteric sclera, pupils reactive to light and accommodation CVS: S1-S2 clear no murmur rubs or gallops Chest: clear to auscultation bilaterally, no wheezing rales or rhonchi Abdomen: soft nontender, nondistended, normal bowel sounds, no organomegaly Extremities: no cyanosis, clubbing or edema noted bilaterally Neuro: Cranial nerves II-XII intact, no focal neurological deficits  Disposition:  Home  Diet:  Regular  Activity:  Instructions discussed   Disposition and Follow-up:    F/U scheduled  TESTS THAT NEED FOLLOW-UP  N/A  DISCHARGE FOLLOW-UP Follow-up Information    Anner Crete, MD.   Specialty:  Urology Why:  I will arrange f/u next week for removal of the stent and CT to assess stone clearance.  Contact information: [address] [place] [address] [phone]           Time spent on Discharge:  10 mins  Signed: Chelsea Aus 08/23/2016, 8:00 AM

## 2016-08-23 NOTE — Care Management Note (Signed)
Case Management Note  Patient Details  Name: Gwendolyn Snyder MRN: [id number] Date of Birth: 1950/10/30  Subjective/Objective:                    Action/Plan:d/c Home.   Expected Discharge Date:                  Expected Discharge Plan:  Home/Self Care  In-House Referral:     Discharge planning Services  CM Consult  Post Acute Care Choice:    Choice offered to:     DME Arranged:    DME Agency:     HH Arranged:    HH Agency:     Status of Service:  Completed, signed off  If discussed at [employer] of Stay Meetings, dates discussed:    Additional Comments:  Lanier Clam, RN 08/23/2016, 11:22 AM

## 2016-08-23 NOTE — Op Note (Signed)
NAME:  Gwendolyn Snyder, Gwendolyn Snyder                      ACCOUNT NO.:  MEDICAL RECORD NO.:  [id number]  LOCATION:                                 FACILITY:  PHYSICIAN:  Excell Seltzer. Annabell Howells, M.D.         DATE OF BIRTH:  DATE OF PROCEDURE:  08/22/2016 DATE OF DISCHARGE:                              OPERATIVE REPORT   PROCEDURE: 1. Second-look left percutaneous nephrolithotomy for less than 2 cm     stone. 2. Left antegrade nephrostogram through existing tract with     interpretation.  PREOPERATIVE DIAGNOSIS:  Residual left renal stone fragments post first- stage percutaneous nephrolithotomy for staghorn stone.  POSTOPERATIVE DIAGNOSIS:  Residual left renal stone fragments post first- stage percutaneous nephrolithotomy for staghorn stone.  SURGEON:  Excell Seltzer. Annabell Howells, M.D.  ANESTHESIA:  General.  SPECIMEN:  Stone.  DRAINS:  A 6-French x 24 cm left double-J stent and Foley catheter.  BLOOD LOSS:  None.  COMPLICATIONS:  None.  INDICATIONS:  Gwendolyn Snyder is a 66 year old white female who was initially treated for a full left staghorn calculus on April 10 with first stage percutaneous nephrolithotomy.  Postoperative CT scan revealed residual fragments in the mid and lower pole calices.  It was felt that she needed to return for second look.  FINDINGS AND PROCEDURE:  She was taken to the operating room where a general anesthetic was induced on the holding room stretcher.  She was given Ancef.  A Foley catheter was inserted.  She was then rolled prone on chest rolls with care to pad all pressure points and her previously paced nephrostomy site was then covered.  The large nephrostomy tube was removed.  The safety catheter was left in place.  The site was prepped with Betadine solution.  She was draped in usual sterile fashion.  She had been fitted with PAS hose prior to draping.  Initially, the flexible nephroscope was passed through the tract and she was noted to have a fairly significant amount of  gravel in the renal pelvis with stones in the 3-4 mm range.  These were removed initially using the NGage basket.  I then thoroughly inspected the lower and mid pole calices.  Additional fragments were flushed in the renal pelvis and the larger ones removed with the basket.  Contrast was instilled at this point to assess the lower and upper pole calices.  No significant filling defects were noted in any of those or midpole calices.  At this point, the rigid scope was inserted and the Swiss lithoclast was inserted with just the ultrasonic component.  This was used to vacuum out several small fragments of stone material.  Of note, adjacent to the UPJ on the anterior pelvis, there was a tear in the wall into fat. There did not appear to be extensive extravasation or free entry into the peritoneum, but there was indeed approximately 1 cm mucosal tear.  I was very careful not to probe too deeply in this area.  Once the rigid scope had been used to vacuum out fragments, a final pass with the flexible scope was performed once again with no additional significant  stone fragments identified, but there were some small bits of gravel in the proximal ureter that were removed with a NCompass basket.  The flexible scope was removed and a superstiff guidewire was passed through the safety catheter to the bladder under fluoroscopic guidance and the safety catheter was removed.  The rigid scope was then inserted over the wire and a 6-French x 24 cm Contour double-J stent was inserted over the wire under visual and fluoroscopic guidance.  Once the tip was in the bladder, the wire was removed and the coil was adjusted in the kidney to make sure it did not rest on top of the mucosal tear.  At this point, additional contrast was instilled which revealed free efflux to the bladder confirming intravesical placement of the stent loop and there was some minor extravasation that appeared contained in the  peripelvic fat from the mucosal tear, but there was no filling defects suggestive of significant residual stone material.  At this point, the nephroscope was removed and the nephrostomy site was left open to allow free drainage.  The drapes were removed.  The dressing was applied.  The patient was then rolled supine on the recovery room stretcher.  Anesthetic was reversed and she was moved to recovery room in stable condition.  There were no complications.  She will be kept overnight with Foley catheter drainage, then discharged home.     Excell Seltzer. Annabell Howells, M.D.     JJW/MEDQ  D:  08/22/2016  T:  08/22/2016  Job:  [id number]

## 2016-08-29 DIAGNOSIS — N200 Calculus of kidney: Secondary | ICD-10-CM | POA: Diagnosis not present

## 2016-09-02 ENCOUNTER — Other Ambulatory Visit: Payer: Self-pay | Admitting: Urology

## 2016-09-02 NOTE — Progress Notes (Signed)
LM on VM for Gwendolyn Snyder to inform Dr. Annabell Howells to place orders in [facility] as patient is being scheduled for a pre-op appointment !

## 2016-09-05 ENCOUNTER — Other Ambulatory Visit: Payer: Self-pay | Admitting: Urology

## 2016-09-09 DIAGNOSIS — I10 Essential (primary) hypertension: Secondary | ICD-10-CM | POA: Diagnosis not present

## 2016-09-09 DIAGNOSIS — E1165 Type 2 diabetes mellitus with hyperglycemia: Secondary | ICD-10-CM | POA: Diagnosis not present

## 2016-09-09 DIAGNOSIS — E782 Mixed hyperlipidemia: Secondary | ICD-10-CM | POA: Diagnosis not present

## 2016-09-09 DIAGNOSIS — Z1389 Encounter for screening for other disorder: Secondary | ICD-10-CM | POA: Diagnosis not present

## 2016-09-09 DIAGNOSIS — E119 Type 2 diabetes mellitus without complications: Secondary | ICD-10-CM | POA: Diagnosis not present

## 2016-09-09 DIAGNOSIS — E6609 Other obesity due to excess calories: Secondary | ICD-10-CM | POA: Diagnosis not present

## 2016-09-09 DIAGNOSIS — N200 Calculus of kidney: Secondary | ICD-10-CM | POA: Diagnosis not present

## 2016-09-09 DIAGNOSIS — Z6833 Body mass index (BMI) 33.0-33.9, adult: Secondary | ICD-10-CM | POA: Diagnosis not present

## 2016-09-09 DIAGNOSIS — N182 Chronic kidney disease, stage 2 (mild): Secondary | ICD-10-CM | POA: Diagnosis not present

## 2016-09-10 NOTE — Patient Instructions (Addendum)
Gwendolyn Snyder  09/10/2016   Your procedure is scheduled on: 09-17-16   Report to [facility] Main Entrance Take [place]  elevators to 3rd floor to Short Stay Center at 0700 AM.    Call this number if you have problems the morning of surgery [phone]    Remember: ONLY 1 PERSON MAY GO WITH YOU TO SHORT STAY TO GET  READY MORNING OF YOUR SURGERY.  Do not eat food or drink liquids :After Midnight.     Take these medicines the morning of surgery with A SIP OF WATER: Amlodopine (Norvasc),  Oxycodone as needed                               You may not have any metal on your body including hair pins and              piercings  Do not wear jewelry, make-up, lotions, powders or perfumes, deodorant             Do not wear nail polish.  Do not shave  48 hours prior to surgery.                Do not bring valuables to the hospital. [place] IS NOT             RESPONSIBLE   FOR VALUABLES.  Contacts, dentures or bridgework may not be worn into surgery.  Leave suitcase in the car. After surgery it may be brought to your room.     Patients discharged the day of surgery will not be allowed to drive home.  Name and phone number of your driver: Jarvis Newcomer [phone]              Please read over the following fact sheets you were given: _____________________________________________________________________             [facility] - Preparing for Surgery Before surgery, you can play an important role.  Because skin is not sterile, your skin needs to be as free of germs as possible.  You can reduce the number of germs on your skin by washing with CHG (chlorahexidine gluconate) soap before surgery.  CHG is an antiseptic cleaner which kills germs and bonds with the skin to continue killing germs even after washing. Please DO NOT use if you have an allergy to CHG or antibacterial soaps.  If your skin becomes reddened/irritated stop using the CHG and inform your nurse when you  arrive at Short Stay. Do not shave (including legs and underarms) for at least 48 hours prior to the first CHG shower.  You may shave your face/neck. Please follow these instructions carefully:  1.  Shower with CHG Soap the night before surgery and the  morning of Surgery.  2.  If you choose to wash your hair, wash your hair first as usual with your  normal  shampoo.  3.  After you shampoo, rinse your hair and body thoroughly to remove the  shampoo.                           4.  Use CHG as you would any other liquid soap.  You can apply chg directly  to the skin and wash  Gently with a scrungie or clean washcloth.  5.  Apply the CHG Soap to your body ONLY FROM THE NECK DOWN.   Do not use on face/ open                           Wound or open sores. Avoid contact with eyes, ears mouth and genitals (private parts).                       Wash face,  Genitals (private parts) with your normal soap.             6.  Wash thoroughly, paying special attention to the area where your surgery  will be performed.  7.  Thoroughly rinse your body with warm water from the neck down.  8.  DO NOT shower/wash with your normal soap after using and rinsing off  the CHG Soap.                9.  Pat yourself dry with a clean towel.            10.  Wear clean pajamas.            11.  Place clean sheets on your bed the night of your first shower and do not  sleep with pets. Day of Surgery : Do not apply any lotions/deodorants the morning of surgery.  Please wear clean clothes to the hospital/surgery center.  FAILURE TO FOLLOW THESE INSTRUCTIONS MAY RESULT IN THE CANCELLATION OF YOUR SURGERY PATIENT SIGNATURE_________________________________  NURSE SIGNATURE__________________________________  ________________________________________________________________________

## 2016-09-11 ENCOUNTER — Encounter (HOSPITAL_COMMUNITY): Payer: Self-pay

## 2016-09-11 ENCOUNTER — Encounter (HOSPITAL_COMMUNITY)
Admission: RE | Admit: 2016-09-11 | Discharge: 2016-09-11 | Disposition: A | Discharge status: Home / Self Care | Payer: PPO | Source: Ambulatory Visit | Attending: Urology | Admitting: Urology

## 2016-09-11 DIAGNOSIS — Z01812 Encounter for preprocedural laboratory examination: Secondary | ICD-10-CM | POA: Insufficient documentation

## 2016-09-11 DIAGNOSIS — E119 Type 2 diabetes mellitus without complications: Secondary | ICD-10-CM | POA: Diagnosis not present

## 2016-09-11 NOTE — Progress Notes (Signed)
08-21-16  (EPIC)  Surgical PCR, H&H 08-20-16 (EPIC) PT/ INR Blood glucose, HGA1C 08-14-16 (EPIC) CBC, BMP, T&S,HGA1C  10-02-15 (EPIC) EKG 10-18-15  (EPIC) ECHO  Pt indicated that Diabetes is diet controlled.

## 2016-09-16 NOTE — H&P (Signed)
CC: I have kidney stones.(Surgery)  HPI: Gwendolyn Snyder is a 66 year-old female established patient who is here for renal calculi after a surgical intervention.  Patient underwent second look PCNL to further clear stone fragments on the left from a previous PCNL a few days before. She was discharged on 4/13. A ureteral stent remains in place but the nephrostomy tube was removed prior to discharge. There was a tear noted in the mucosa adjacent to the UPJ on the anterior pelvis. During the postprocedure nephrostogram, only a small amount of extravasation of contrast noted in the 1 cm mucosal tear but well contained in the peripelvic fat.    August 29, 2016: Denies nephrostomy tube insertion site drainage or tenderness. She has some left sided pain when changing positions and when lying on the left side. Otherwise she is doing well without other significant issue.   The problem is on the left side. She had stent and percutaneous lithotomy for treatment of her renal calculi. Patient denies ureteroscopy and eswl. This procedure was done 08/22/2016. She did not pass stone fragments. This is not her first kidney stone. She does have a stent in place.   She is not currently having flank pain, back pain, groin pain, nausea, vomiting, fever or chills.   She does not have dysuria. She does have urgency. She does have frequency.     ALLERGIES: Aspirin - Other Reaction, "makes ears ring and heart beat fast" Codeine - Other Reaction, "hard to wake up"    MEDICATIONS: Lisinopril 20 mg tablet  Acetaminophen Er 650 mg tablet, extended release  Amlodipine Besylate 5 mg tablet  Ferrous Sulfate 325 mg (65 mg iron) tablet  Latanoprost 0.005 % drops  Niacin Inositol  Oxycodone-Acetaminophen 5 mg-325 mg tablet 1 tablet PO Q 6 H PRN prn pain  Potassium Chloride 10 meq capsule, extended release  Vitamin D3 1,000 unit tablet     GU PSH: Compl Pyelo Lithotomy Hysterectomy Locm 300-399Mg /Ml Iodine,1Ml -  08/08/2016 Percut Stone Removal >2cm - 08/20/2016 Stone Removal Nephrost Tube, Left - 08/22/2016    NON-GU PSH: Carpal tunnel surgery, Right Hip Replacement, Bilateral Knee replacement, Right    GU PMH: Calculus of kidney, Left, 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 There is no obstruction. I am going to get her set up for left PCNL and will request upper and lower pole access. She will likely need 2-3 procedures to be rendered stone free. I have reviewed the risks of bleeding, infection, injury to the kidney or adjacent organs, urine leaks, need for multiple procedures, thrombotic events and anesthetic complications. Urine culture will be sent today. - 08/08/2016 Gross hematuria, The hematuria appears to be from the stone and she had colic post CT that could be from clots. She required a morphine injection post CT and will be sent home with oxycodone. - 08/08/2016    NON-GU PMH: Anxiety disorder, unspecified Chronic obstructive pulmonary disease, unspecified Essential (primary) hypertension Gout, unspecified Pure hypercholesterolemia, unspecified Type 2 diabetes mellitus without complications Unspecified glaucoma Unspecified osteoarthritis, unspecified site    FAMILY HISTORY: Cancer - Father Death of family member - Mother, Father Diabetes - Mother, Sister Glaucoma - Brother, Father Heart Attack - Mother Heart Disease - Runs in Family Kidney Stones - Father, Brother   SOCIAL HISTORY: Marital Status: Single Current Smoking Status: Patient does not smoke anymore. Has not smoked since 07/11/2004. Smoked for 30 years. Smoked 1 pack per day.   Tobacco  Use Assessment Completed: Used Tobacco in last 30 days? Has never drank.  Does not drink caffeine. Patient's occupation is/was retired.    REVIEW OF SYSTEMS:    GU Review Female:   Patient denies frequent urination, hard to postpone urination, burning /pain with urination, get up at night to  urinate, leakage of urine, stream starts and stops, trouble starting your stream, have to strain to urinate, and currently pregnant.  Gastrointestinal (Upper):   Patient denies nausea, vomiting, and indigestion/ heartburn.  Gastrointestinal (Lower):   Patient denies diarrhea and constipation.  Constitutional:   Patient denies weight loss, night sweats, fever, and fatigue.  Skin:   Patient denies skin rash/ lesion and itching.  Eyes:   Patient denies blurred vision and double vision.  Ears/ Nose/ Throat:   Patient denies sore throat and sinus problems.  Hematologic/Lymphatic:   Patient denies swollen glands and easy bruising.  Cardiovascular:   Patient denies leg swelling and chest pains.  Respiratory:   Patient denies cough and shortness of breath.  Endocrine:   Patient denies excessive thirst.  Musculoskeletal:   Patient denies back pain and joint pain.  Neurological:   Patient denies headaches and dizziness.  Psychologic:   Patient denies depression and anxiety.   VITAL SIGNS: None   MULTI-SYSTEM PHYSICAL EXAMINATION:    Constitutional: Well-nourished. No physical deformities. Normally developed. Good grooming.  Neck: Neck symmetrical, not swollen. Normal tracheal position.  Respiratory: No labored breathing, no use of accessory muscles. CTA.  Cardiovascular: Normal temperature, normal extremity pulses, no swelling, no varicosities. RRR w/o murmur   Skin: No paleness, no jaundice, no cyanosis. No lesion, no ulcer, no rash.  Neurologic / Psychiatric: Oriented to time, oriented to place, oriented to person. No depression, no anxiety, no agitation.  Gastrointestinal: Obese abdomen. No mass, no tenderness, no rigidity. Well approximated left nephrostomy site. No flank or suprapubic tenderness.  Musculoskeletal: Spine, ribs, pelvis no bilateral tenderness. Normal gait and station of head and neck.     PAST DATA REVIEWED:  Source Of History:  Patient  Records Review:   Previous Hospital  Records, Previous Patient Records  Urine Test Review:   Urinalysis  X-Ray Review: [facility] Protocol: Reviewed Films.     08/29/16  Urinalysis  Urine Appearance Cloudy   Urine Color Yellow   Urine Glucose Neg   Urine Bilirubin Neg   Urine Ketones Neg   Urine Specific Gravity 1.020   Urine Blood 3+   Urine pH <=5.0   Urine Protein 1+   Urine Urobilinogen 0.2   Urine Nitrites Neg   Urine Leukocyte Esterase Trace   Urine WBC/hpf 6 - 10/hpf   Urine RBC/hpf >60/hpf   Urine Epithelial Cells 0 - 5/hpf   Urine Bacteria Few (10-25/hpf)   Urine Mucous Not Present   Urine Yeast NS (Not Seen)   Urine Trichomonas Not Present   Urine Cystals NS (Not Seen)   Urine Casts Hyaline   Urine Sperm Not Present    PROCEDURES:         [facility] - [id number]               Urinalysis w/Scope Dipstick Dipstick Cont'd Micro  Color: Yellow Bilirubin: Neg WBC/hpf: 6 - 10/hpf  Appearance: Cloudy Ketones: Neg RBC/hpf: >60/hpf  Specific Gravity: 1.020 Blood: 3+ Bacteria: Few (10-25/hpf)  pH: <=5.0 Protein: 1+ Cystals: NS (Not Seen)  Glucose: Neg Urobilinogen: 0.2 Casts: Hyaline    Nitrites: Neg Trichomonas: Not Present    Leukocyte Esterase:  Trace Mucous: Not Present      Epithelial Cells: 0 - 5/hpf      Yeast: NS (Not Seen)      Sperm: Not Present    ASSESSMENT:      ICD-10 Details  1 GU:   Calculus of kidney - N20.0    PLAN:           Orders Labs Urine Culture          Schedule Return Visit/Planned Activity: 1-2 Weeks - Follow up MD, Office Visit, Cystoscopy          Document Letter(s):  Created for Patient: Clinical Summary         Notes:   Clinically she is doing very well and is tolerating the stent without significant issue. Possibility of some small stranding adjacent to the proximal curl of the stent noted on CT imaging today as well as some upper and lower pole calcinosis versus residual stone fragments. Official read is still pending. Noting her operative course, I feel  conservative management is best at this time. I'll schedule patient for one to 2 week follow-up with Dr. Annabell Howells to go over CT results as well as consideration for stent removal.    discussed further with Dr Annabell Howells after her OV today. There appears to be stone fragments in the kidney. His goal for her is to be stone free. I will contact the patient and have her scheduled for URS in two weeks time, her stent will remain until then. Risks and benefits of the procedure will be discussed in detail with the patient.

## 2016-09-17 ENCOUNTER — Encounter (HOSPITAL_COMMUNITY): Payer: Self-pay | Admitting: *Deleted

## 2016-09-17 ENCOUNTER — Encounter (HOSPITAL_COMMUNITY)
Admission: RE | Disposition: A | Discharge status: Home / Self Care | Payer: Self-pay | Source: Ambulatory Visit | Attending: Urology

## 2016-09-17 ENCOUNTER — Ambulatory Visit (HOSPITAL_COMMUNITY): Payer: PPO | Admitting: Anesthesiology

## 2016-09-17 ENCOUNTER — Ambulatory Visit (HOSPITAL_COMMUNITY)
Admission: RE | Admit: 2016-09-17 | Discharge: 2016-09-17 | Disposition: A | Discharge status: Home / Self Care | Payer: PPO | Source: Ambulatory Visit | Attending: Urology | Admitting: Urology

## 2016-09-17 ENCOUNTER — Ambulatory Visit (HOSPITAL_COMMUNITY): Payer: PPO

## 2016-09-17 DIAGNOSIS — Z96643 Presence of artificial hip joint, bilateral: Secondary | ICD-10-CM | POA: Insufficient documentation

## 2016-09-17 DIAGNOSIS — Z87891 Personal history of nicotine dependence: Secondary | ICD-10-CM | POA: Insufficient documentation

## 2016-09-17 DIAGNOSIS — N200 Calculus of kidney: Secondary | ICD-10-CM | POA: Insufficient documentation

## 2016-09-17 DIAGNOSIS — Z79899 Other long term (current) drug therapy: Secondary | ICD-10-CM | POA: Diagnosis not present

## 2016-09-17 DIAGNOSIS — Z96651 Presence of right artificial knee joint: Secondary | ICD-10-CM | POA: Insufficient documentation

## 2016-09-17 DIAGNOSIS — E119 Type 2 diabetes mellitus without complications: Secondary | ICD-10-CM | POA: Diagnosis not present

## 2016-09-17 DIAGNOSIS — J449 Chronic obstructive pulmonary disease, unspecified: Secondary | ICD-10-CM | POA: Insufficient documentation

## 2016-09-17 DIAGNOSIS — I10 Essential (primary) hypertension: Secondary | ICD-10-CM | POA: Insufficient documentation

## 2016-09-17 DIAGNOSIS — N201 Calculus of ureter: Secondary | ICD-10-CM | POA: Diagnosis not present

## 2016-09-17 DIAGNOSIS — Z466 Encounter for fitting and adjustment of urinary device: Secondary | ICD-10-CM | POA: Diagnosis not present

## 2016-09-17 HISTORY — PX: CYSTOSCOPY/RETROGRADE/URETEROSCOPY/STONE EXTRACTION WITH BASKET: SHX5317

## 2016-09-17 LAB — BASIC METABOLIC PANEL
Anion gap: 11 (ref 5–15)
BUN: 26 mg/dL — ABNORMAL HIGH (ref 6–20)
CO2: 19 mmol/L — ABNORMAL LOW (ref 22–32)
Calcium: 9.5 mg/dL (ref 8.9–10.3)
Chloride: 112 mmol/L — ABNORMAL HIGH (ref 101–111)
Creatinine, Ser: 0.91 mg/dL (ref 0.44–1.00)
GFR calc Af Amer: >60 mL/min (ref >60)
GFR calc non Af Amer: >60 mL/min (ref >60)
Glucose, Bld: 118 mg/dL — ABNORMAL HIGH (ref 65–99)
Potassium: 3.3 mmol/L — ABNORMAL LOW (ref 3.5–5.1)
Sodium: 142 mmol/L (ref 135–145)

## 2016-09-17 LAB — URIC ACID: Uric Acid, Serum: 6.8 mg/dL — ABNORMAL HIGH (ref 2.3–6.6)

## 2016-09-17 LAB — GLUCOSE, CAPILLARY: Glucose-Capillary: 101 mg/dL — ABNORMAL HIGH (ref 65–99)

## 2016-09-17 SURGERY — CYSTOSCOPY, WITH CALCULUS REMOVAL USING BASKET
Anesthesia: General | Laterality: Left

## 2016-09-17 MED ORDER — 0.9 % SODIUM CHLORIDE (POUR BTL) OPTIME
TOPICAL | Status: DC | PRN
  Administered 2016-09-17: 1000 mL

## 2016-09-17 MED ORDER — PROPOFOL 10 MG/ML IV BOLUS
INTRAVENOUS | Status: AC
  Filled 2016-09-17: qty 20

## 2016-09-17 MED ORDER — SODIUM CHLORIDE 0.9 % IR SOLN
Status: DC | PRN
  Administered 2016-09-17: 1000 mL
  Administered 2016-09-17: 3000 mL

## 2016-09-17 MED ORDER — ONDANSETRON HCL 4 MG/2ML IJ SOLN
INTRAMUSCULAR | Status: DC | PRN
  Administered 2016-09-17: 4 mg via INTRAVENOUS

## 2016-09-17 MED ORDER — ONDANSETRON HCL 4 MG/2ML IJ SOLN
INTRAMUSCULAR | Status: AC
  Filled 2016-09-17: qty 2

## 2016-09-17 MED ORDER — LACTATED RINGERS IV SOLN
INTRAVENOUS | Status: DC
  Administered 2016-09-17 (×2): via INTRAVENOUS

## 2016-09-17 MED ORDER — SODIUM CHLORIDE 0.9 % IR SOLN
Status: DC | PRN
  Administered 2016-09-17: 3000 mL

## 2016-09-17 MED ORDER — PROMETHAZINE HCL 25 MG/ML IJ SOLN: 6.2500 mg | INTRAMUSCULAR | Status: DC | PRN

## 2016-09-17 MED ORDER — DEXAMETHASONE SODIUM PHOSPHATE 10 MG/ML IJ SOLN
INTRAMUSCULAR | Status: AC
  Filled 2016-09-17: qty 1

## 2016-09-17 MED ORDER — FENTANYL CITRATE (PF) 100 MCG/2ML IJ SOLN
INTRAMUSCULAR | Status: DC | PRN
  Administered 2016-09-17: 50 ug via INTRAVENOUS
  Administered 2016-09-17 (×2): 25 ug via INTRAVENOUS

## 2016-09-17 MED ORDER — MIDAZOLAM HCL 5 MG/5ML IJ SOLN
INTRAMUSCULAR | Status: DC | PRN
  Administered 2016-09-17: 2 mg via INTRAVENOUS

## 2016-09-17 MED ORDER — MIDAZOLAM HCL 2 MG/2ML IJ SOLN
INTRAMUSCULAR | Status: AC
  Filled 2016-09-17: qty 2

## 2016-09-17 MED ORDER — FENTANYL CITRATE (PF) 100 MCG/2ML IJ SOLN: 25.0000 ug | INTRAMUSCULAR | Status: DC | PRN

## 2016-09-17 MED ORDER — OXYCODONE HCL 5 MG PO TABS: 5.0000 mg | ORAL_TABLET | Freq: Once | ORAL | Status: DC | PRN

## 2016-09-17 MED ORDER — POTASSIUM CITRATE ER 10 MEQ (1080 MG) PO TBCR: 10.0000 meq | EXTENDED_RELEASE_TABLET | Freq: Three times a day (TID) | ORAL | 11 refills | Status: DC

## 2016-09-17 MED ORDER — PHENYLEPHRINE 40 MCG/ML (10ML) SYRINGE FOR IV PUSH (FOR BLOOD PRESSURE SUPPORT)
PREFILLED_SYRINGE | INTRAVENOUS | Status: AC
  Filled 2016-09-17: qty 10

## 2016-09-17 MED ORDER — PROPOFOL 10 MG/ML IV BOLUS
INTRAVENOUS | Status: DC | PRN
  Administered 2016-09-17: 200 mg via INTRAVENOUS

## 2016-09-17 MED ORDER — PHENYLEPHRINE HCL 10 MG/ML IJ SOLN
INTRAMUSCULAR | Status: DC | PRN
  Administered 2016-09-17 (×2): 80 ug via INTRAVENOUS
  Administered 2016-09-17: 40 ug via INTRAVENOUS
  Administered 2016-09-17: 80 ug via INTRAVENOUS

## 2016-09-17 MED ORDER — FENTANYL CITRATE (PF) 100 MCG/2ML IJ SOLN
INTRAMUSCULAR | Status: AC
  Filled 2016-09-17: qty 2

## 2016-09-17 MED ORDER — CEFAZOLIN SODIUM-DEXTROSE 2-4 GM/100ML-% IV SOLN
2.0000 g | INTRAVENOUS | Status: AC
  Administered 2016-09-17: 2 g via INTRAVENOUS
  Filled 2016-09-17: qty 100

## 2016-09-17 MED ORDER — LIDOCAINE 2% (20 MG/ML) 5 ML SYRINGE
INTRAMUSCULAR | Status: DC | PRN
  Administered 2016-09-17: 100 mg via INTRAVENOUS

## 2016-09-17 MED ORDER — LIDOCAINE 2% (20 MG/ML) 5 ML SYRINGE
INTRAMUSCULAR | Status: AC
  Filled 2016-09-17: qty 5

## 2016-09-17 MED ORDER — OXYCODONE HCL 5 MG/5ML PO SOLN
5.0000 mg | Freq: Once | ORAL | Status: DC | PRN
  Filled 2016-09-17: qty 5

## 2016-09-17 MED ORDER — DEXAMETHASONE SODIUM PHOSPHATE 10 MG/ML IJ SOLN
INTRAMUSCULAR | Status: DC | PRN
  Administered 2016-09-17: 10 mg via INTRAVENOUS

## 2016-09-17 SURGICAL SUPPLY — 22 items
BAG URO CATCHER STRL LF (MISCELLANEOUS) ×2 IMPLANT
BASKET STONE NCOMPASS (UROLOGICAL SUPPLIES) IMPLANT
CATH URET 5FR 28IN OPEN ENDED (CATHETERS) ×1 IMPLANT
CATH URET DUAL LUMEN 6-10FR 50 (CATHETERS) ×2 IMPLANT
CLOTH BEACON ORANGE TIMEOUT ST (SAFETY) ×2 IMPLANT
COVER SURGICAL LIGHT HANDLE (MISCELLANEOUS) ×1 IMPLANT
EXTRACTOR STONE NITINOL NGAGE (UROLOGICAL SUPPLIES) ×2 IMPLANT
FIBER LASER FLEXIVA 1000 (UROLOGICAL SUPPLIES) IMPLANT
FIBER LASER FLEXIVA 365 (UROLOGICAL SUPPLIES) IMPLANT
FIBER LASER FLEXIVA 550 (UROLOGICAL SUPPLIES) IMPLANT
GLOVE SURG SS PI 8.0 STRL IVOR (GLOVE) ×1 IMPLANT
GOWN STRL REUS W/TWL XL LVL3 (GOWN DISPOSABLE) ×2 IMPLANT
GUIDEWIRE STR DUAL SENSOR (WIRE) ×2 IMPLANT
IV NS 1000ML (IV SOLUTION)
IV NS 1000ML BAXH (IV SOLUTION) ×1 IMPLANT
IV NS IRRIG 3000ML ARTHROMATIC (IV SOLUTION) ×1 IMPLANT
MANIFOLD NEPTUNE II (INSTRUMENTS) ×2 IMPLANT
PACK CYSTO (CUSTOM PROCEDURE TRAY) ×2 IMPLANT
SHEATH ACCESS URETERAL 38CM (SHEATH) ×2 IMPLANT
SHEATH URET ACCESS 10/12FR (MISCELLANEOUS) IMPLANT
TUBING CONNECTING 10 (TUBING) ×2 IMPLANT
TUBING TUR DISP (UROLOGICAL SUPPLIES) ×1 IMPLANT

## 2016-09-17 NOTE — Interval H&P Note (Signed)
History and Physical Interval Note:  09/17/2016 8:28 AM  Gwendolyn Snyder  has presented today for surgery, with the diagnosis of LEFT RENAL STONE  The various methods of treatment have been discussed with the patient and family. After consideration of risks, benefits and other options for treatment, the patient has consented to  Procedure(s): CYSTOSCOPY/RETROGRADE/URETEROSCOPY/HOLMIUM LASER/STONE EXTRACTION WITH BASKET/ STENT EXCHANGE (Left) as a surgical intervention .  The patient's history has been reviewed, patient examined, no change in status, stable for surgery.  I have reviewed the patient's chart and labs.  Questions were answered to the patient's satisfaction.     Kashae Carstens J

## 2016-09-17 NOTE — Discharge Instructions (Signed)
Ureteroscopy, Care After °This sheet gives you information about how to care for yourself after your procedure. Your health care provider may also give you more specific instructions. If you have problems or questions, contact your health care provider. °What can I expect after the procedure? °After the procedure, it is common to have: °· A burning sensation when you urinate. °· Blood in your urine. °· Mild discomfort in the bladder area or kidney area when urinating. °· Needing to urinate more often or urgently. °Follow these instructions at home: °Medicines  °· Take over-the-counter and prescription medicines only as told by your health care provider. °· If you were prescribed an antibiotic medicine, take it as told by your health care provider. Do not stop taking the antibiotic even if you start to feel better. °General instructions  ° °· Do not drive for 24 hours if you were given a medicine to help you relax (sedative) during your procedure. °· To relieve burning, try taking a warm bath or holding a warm washcloth over your groin. °· Drink enough fluid to keep your urine clear or pale yellow. °¨ Drink two 8-ounce glasses of water every hour for the first 2 hours after you get home. °¨ Continue to drink water often at home. °· You can eat what you usually do. °· Keep all follow-up visits as told by your health care provider. This is important. °¨ If you had a tube placed to keep urine flowing (ureteral stent), ask your health care provider when you need to return to have it removed. °Contact a health care provider if: °· You have chills or a fever. °· You have burning pain for longer than 24 hours after the procedure. °· You have blood in your urine for longer than 24 hours after the procedure. °Get help right away if: °· You have large amounts of blood in your urine. °· You have blood clots in your urine. °· You have very bad pain. °· You have chest pain or trouble breathing. °· You are unable to urinate and  you have the feeling of a full bladder. °This information is not intended to replace advice given to you by your health care provider. Make sure you discuss any questions you have with your health care provider. °Document Released: 05/04/2013 Document Revised: 02/13/2016 Document Reviewed: 02/09/2016 °Elsevier Interactive Patient Education © 2017 [employer] ° °

## 2016-09-17 NOTE — Anesthesia Postprocedure Evaluation (Signed)
Anesthesia Post Note  Patient: Gwendolyn Snyder  Procedure(s) Performed: Procedure(s) (LRB): CYSTOSCOPY/RETROGRADE/URETEROSCOPY/STONE EXTRACTION WITH BASKET/ STENT REMOVAL (Left)  Patient location during evaluation: PACU Anesthesia Type: General Level of consciousness: awake and alert Pain management: pain level controlled Vital Signs Assessment: post-procedure vital signs reviewed and stable Respiratory status: spontaneous breathing, nonlabored ventilation, respiratory function stable and patient connected to nasal cannula oxygen Cardiovascular status: blood pressure returned to baseline and stable Postop Assessment: no signs of nausea or vomiting Anesthetic complications: no       Last Vitals:  Vitals:   09/17/16 0945 09/17/16 1000  BP: (!) 146/74 (!) 146/80  Pulse: 75 81  Resp: 20 16  Temp:      Last Pain:  Vitals:   09/17/16 1000  TempSrc:   PainSc: 0-No pain                 Rhiley Solem S

## 2016-09-17 NOTE — Anesthesia Procedure Notes (Signed)
Procedure Name: LMA Insertion Date/Time: 09/17/2016 8:40 AM Performed by: Doran ClayALDAY, Natalyah Cummiskey R Pre-anesthesia Checklist: Patient identified, Emergency Drugs available, Suction available and Patient being monitored Patient Re-evaluated:Patient Re-evaluated prior to inductionOxygen Delivery Method: Circle system utilized Preoxygenation: Pre-oxygenation with 100% oxygen Ventilation: Mask ventilation without difficulty LMA: LMA inserted LMA Size: 4.0 Number of attempts: 1 Placement Confirmation: positive ETCO2 and breath sounds checked- equal and bilateral Dental Injury: Teeth and Oropharynx as per pre-operative assessment

## 2016-09-17 NOTE — Anesthesia Preprocedure Evaluation (Signed)
Anesthesia Evaluation  Patient identified by MRN, date of birth, ID band Patient awake    Reviewed: Allergy & Precautions, NPO status , Patient's Chart, lab work & pertinent test results  Airway Mallampati: II  TM Distance: >3 FB Neck ROM: Full    Dental no notable dental hx.    Pulmonary COPD, former smoker,    Pulmonary exam normal breath sounds clear to auscultation       Cardiovascular hypertension, Normal cardiovascular exam Rhythm:Regular Rate:Normal     Neuro/Psych negative neurological ROS  negative psych ROS   GI/Hepatic negative GI ROS, Neg liver ROS,   Endo/Other  diabetes  Renal/GU negative Renal ROS  negative genitourinary   Musculoskeletal negative musculoskeletal ROS (+)   Abdominal   Peds negative pediatric ROS (+)  Hematology negative hematology ROS (+)   Anesthesia Other Findings   Reproductive/Obstetrics negative OB ROS                             Anesthesia Physical Anesthesia Plan  ASA: III  Anesthesia Plan: General   Post-op Pain Management:    Induction: Intravenous  Airway Management Planned: LMA  Additional Equipment:   Intra-op Plan:   Post-operative Plan:   Informed Consent: I have reviewed the patients History and Physical, chart, labs and discussed the procedure including the risks, benefits and alternatives for the proposed anesthesia with the patient or authorized representative who has indicated his/her understanding and acceptance.   Dental advisory given  Plan Discussed with: CRNA and Surgeon  Anesthesia Plan Comments:         Anesthesia Quick Evaluation

## 2016-09-17 NOTE — OR Nursing (Signed)
Stone taken by Dr. [name] 

## 2016-09-17 NOTE — Transfer of Care (Signed)
Immediate Anesthesia Transfer of Care Note  Patient: Gwendolyn Snyder  Procedure(s) Performed: Procedure(s): CYSTOSCOPY/RETROGRADE/URETEROSCOPY/STONE EXTRACTION WITH BASKET/ STENT REMOVAL (Left)  Patient Location: PACU  Anesthesia Type:General  Level of Consciousness: sedated  Airway & Oxygen Therapy: Patient Spontanous Breathing and Patient connected to face mask oxygen  Post-op Assessment: Report given to RN and Post -op Vital signs reviewed and stable  Post vital signs: Reviewed and stable  Last Vitals:  Vitals:   09/17/16 0703  BP: (!) 174/78  Pulse: 90  Resp: 20  Temp: 36.8 C    Last Pain:  Vitals:   09/17/16 0710  TempSrc:   PainSc: 0-No pain      Patients Stated Pain Goal: 3 (09/17/16 0710)  Complications: No apparent anesthesia complications

## 2016-09-17 NOTE — Anesthesia Procedure Notes (Signed)
Procedure Name: LMA Insertion Date/Time: 09/17/2016 8:40 AM Performed by: Doran ClayALDAY, Maikayla Beggs R Pre-anesthesia Checklist: Patient identified, Emergency Drugs available, Suction available, Patient being monitored and Timeout performed Patient Re-evaluated:Patient Re-evaluated prior to inductionOxygen Delivery Method: Circle system utilized Preoxygenation: Pre-oxygenation with 100% oxygen Intubation Type: IV induction LMA: LMA inserted LMA Size: 4.0 Tube type: Oral Number of attempts: 1 Placement Confirmation: positive ETCO2 and breath sounds checked- equal and bilateral Tube secured with: Tape

## 2016-09-17 NOTE — Brief Op Note (Signed)
09/17/2016  9:27 AM  PATIENT:  Gwendolyn Snyder  66 y.o. female  PRE-OPERATIVE DIAGNOSIS:  LEFT RENAL STONE  POST-OPERATIVE DIAGNOSIS:  LEFT RENAL STONE  PROCEDURE:  Procedure(s): CYSTOSCOPY/RETROGRADE/URETEROSCOPY/STONE EXTRACTION WITH BASKET/ STENT REMOVAL (Left)  SURGEON:  Surgeon(s) and Role:    Bjorn Pippin* Chrysta Fulcher, MD - Primary  PHYSICIAN ASSISTANT:   ASSISTANTS: none   ANESTHESIA:   general  EBL:  Total I/O In: 1000 [I.V.:1000] Out: -   BLOOD ADMINISTERED:none  DRAINS: none   LOCAL MEDICATIONS USED:  NONE  SPECIMEN:  Source of Specimen:  left renal stone fragments  DISPOSITION OF SPECIMEN:  to office lab for analysis  COUNTS:  YES  TOURNIQUET:  * No tourniquets in log *  DICTATION: .Other Dictation: Dictation Number [id number]  PLAN OF CARE: Discharge to home after PACU  PATIENT DISPOSITION:  PACU - hemodynamically stable.   Delay start of Pharmacological VTE agent (>24hrs) due to surgical blood loss or risk of bleeding: not applicable

## 2016-09-17 NOTE — Op Note (Signed)
NAME:  Gwendolyn Snyder, Gwendolyn Snyder                     ACCOUNT NO.:  MEDICAL RECORD NO.:  [id number]  LOCATION:                                 FACILITY:  PHYSICIAN:  Excell SeltzerJohn J. Annabell HowellsWrenn, M.D.         DATE OF BIRTH:  DATE OF PROCEDURE:  09/17/2016 DATE OF DISCHARGE:                              OPERATIVE REPORT   PROCEDURE: 1. Cystoscopy with removal of right double-J stent. 2. Right ureteroscopic stone extraction.  PREOPERATIVE DIAGNOSIS:  Residual right renal stones, post percutaneous nephrolithotomy.  POSTOPERATIVE DIAGNOSIS:  Residual right renal stones, post percutaneous nephrolithotomy.  SURGEON:  Excell SeltzerJohn J. Annabell HowellsWrenn, M.D.  ANESTHESIA:  General.  SPECIMEN:  Stones and stent.  Stones sent to office lab for analysis. Stent discarded.  BLOOD LOSS:  None.  COMPLICATIONS:  None.  INDICATIONS:  [name] is a 66 year old white female with a history of a left staghorn stone and was felt to be uric acid.  She underwent first and second stage percutaneous nephrolithotomy and returns now for a ureteroscopy for removal of residual fragments.  FINDINGS AND PROCEDURE:  She was taken to the operating room where she was given Ancef.  A general anesthetic was induced.  She was placed in lithotomy position.  Her perineum and genitalia were prepped with Betadine solution.  She was draped in usual sterile fashion.  Cystoscopy was performed using the 23-French scope and the 30-degree lens.  Examination revealed an encrusted stent loop at the left ureteral orifice with some edema.  No other abnormalities were noted.  The stent loop was grasped and pulled to the urethral meatus.  An attempt was made to wire the stent, but was unsuccessful due to the heavy incrustation.  The stent was then removed and a 5-French open-end catheter was inserted to aid passage of a Sensor wire to the kidney.  An assembled 12, 14, and 38 cm digital access sheaths were then inserted over the wire to the level of the UPJ and  the wire and inner core were removed.  The dual-lumen digital flexible scope was then passed without difficulty to the kidney.  Inspection revealed primarily sand size gravel in the kidney that is bright yellow consistent with uric acid stones.  A few 2- 3 mm stones were identified within the gravel.  These were grasped with the NGage basket and removed without difficulty.  Attempt was made to irrigate as much stone material out of the kidney as possible.  A portion of it was irrigated out.  It was so fine that it passed through the screen into the suction.  I could not get all of the stone material to evacuate, but it was felt that all of these fragments were sufficiently small that they should pass without difficulty.  Once as much stone material was removed, the sheath and ureteroscope were backed out under direct vision.  No significant ureteral trauma was identified, so it was not felt that another stent was indicated.  The bladder was drained.  The access sheath was removed.  The patient was taken down from lithotomy position.  Her anesthetic was reversed.  She was moved to the  recovery room in stable condition.  A few fragments were collected and will be taken to the office for analysis as the initial staghorn stone fragments were ended up being evaluated gross only and I do not have a metabolic analysis.     Excell Seltzer. Annabell Howells, M.D.     JJW/MEDQ  D:  09/17/2016  T:  09/17/2016  Job:  [id number]

## 2016-09-18 ENCOUNTER — Encounter (HOSPITAL_COMMUNITY): Payer: Self-pay | Admitting: Urology

## 2016-09-24 DIAGNOSIS — N200 Calculus of kidney: Secondary | ICD-10-CM | POA: Diagnosis not present

## 2016-09-26 DIAGNOSIS — Z1211 Encounter for screening for malignant neoplasm of colon: Secondary | ICD-10-CM | POA: Diagnosis not present

## 2016-10-14 NOTE — Addendum Note (Signed)
Addendum  created 10/14/16 1427 by Kaoru Benda, MD   Sign clinical note    

## 2016-10-14 NOTE — Anesthesia Postprocedure Evaluation (Signed)
Anesthesia Post Note  Patient: Gwendolyn Snyder  Procedure(s) Performed: Procedure(s) (LRB): CYSTOSCOPY/RETROGRADE/URETEROSCOPY/STONE EXTRACTION WITH BASKET/ STENT REMOVAL (Left)     Anesthesia Post Evaluation  Last Vitals:  Vitals:   09/17/16 1026 09/17/16 1050  BP: 136/69 (!) 160/72  Pulse: 81 78  Resp:  18  Temp: 36.7 C 36.7 C    Last Pain:  Vitals:   09/18/16 1056  TempSrc:   PainSc: 0-No pain                 Oriah Leinweber S

## 2016-10-18 NOTE — Addendum Note (Signed)
Addendum  created 10/18/16 1158 by Rumaldo Difatta, MD   Sign clinical note    

## 2016-10-29 DIAGNOSIS — N200 Calculus of kidney: Secondary | ICD-10-CM | POA: Diagnosis not present

## 2016-10-31 DIAGNOSIS — E790 Hyperuricemia without signs of inflammatory arthritis and tophaceous disease: Secondary | ICD-10-CM | POA: Diagnosis not present

## 2016-11-08 DIAGNOSIS — H2513 Age-related nuclear cataract, bilateral: Secondary | ICD-10-CM | POA: Diagnosis not present

## 2016-11-08 DIAGNOSIS — E119 Type 2 diabetes mellitus without complications: Secondary | ICD-10-CM | POA: Diagnosis not present

## 2016-11-08 DIAGNOSIS — H524 Presbyopia: Secondary | ICD-10-CM | POA: Diagnosis not present

## 2016-11-08 DIAGNOSIS — H401111 Primary open-angle glaucoma, right eye, mild stage: Secondary | ICD-10-CM | POA: Diagnosis not present

## 2016-11-11 DIAGNOSIS — N200 Calculus of kidney: Secondary | ICD-10-CM | POA: Diagnosis not present

## 2016-11-18 DIAGNOSIS — N200 Calculus of kidney: Secondary | ICD-10-CM | POA: Diagnosis not present

## 2016-11-22 NOTE — Addendum Note (Signed)
Addendum  created 11/22/16 1114 by Lonzell Dorris, MD   Sign clinical note    

## 2016-11-22 NOTE — Anesthesia Postprocedure Evaluation (Signed)
Anesthesia Post Note  Patient: Gwendolyn Snyder  Procedure(s) Performed: Procedure(s) (LRB): LEFT NEPHROLITHOTOMY PERCUTANEOUS FIRST STAGE (Left)     Anesthesia Post Evaluation  Last Vitals:  Vitals:   08/23/16 0506 08/23/16 0954  BP: (!) 144/65 (!) 167/74  Pulse: 79 80  Resp: 18   Temp: 36.4 C     Last Pain:  Vitals:   08/23/16 0954  TempSrc:   PainSc: 3                  Rayna Brenner EDWARD

## 2016-11-26 DIAGNOSIS — H25811 Combined forms of age-related cataract, right eye: Secondary | ICD-10-CM | POA: Diagnosis not present

## 2016-11-26 DIAGNOSIS — H2511 Age-related nuclear cataract, right eye: Secondary | ICD-10-CM | POA: Diagnosis not present

## 2016-12-18 DIAGNOSIS — N200 Calculus of kidney: Secondary | ICD-10-CM | POA: Diagnosis not present

## 2016-12-24 DIAGNOSIS — N200 Calculus of kidney: Secondary | ICD-10-CM | POA: Diagnosis not present

## 2016-12-24 DIAGNOSIS — R8299 Other abnormal findings in urine: Secondary | ICD-10-CM | POA: Diagnosis not present

## 2017-01-14 DIAGNOSIS — J449 Chronic obstructive pulmonary disease, unspecified: Secondary | ICD-10-CM | POA: Diagnosis not present

## 2017-01-14 DIAGNOSIS — E1165 Type 2 diabetes mellitus with hyperglycemia: Secondary | ICD-10-CM | POA: Diagnosis not present

## 2017-01-14 DIAGNOSIS — N182 Chronic kidney disease, stage 2 (mild): Secondary | ICD-10-CM | POA: Diagnosis not present

## 2017-01-14 DIAGNOSIS — E782 Mixed hyperlipidemia: Secondary | ICD-10-CM | POA: Diagnosis not present

## 2017-01-14 DIAGNOSIS — I10 Essential (primary) hypertension: Secondary | ICD-10-CM | POA: Diagnosis not present

## 2017-01-14 DIAGNOSIS — Z6835 Body mass index (BMI) 35.0-35.9, adult: Secondary | ICD-10-CM | POA: Diagnosis not present

## 2017-01-15 DIAGNOSIS — H43813 Vitreous degeneration, bilateral: Secondary | ICD-10-CM | POA: Diagnosis not present

## 2017-01-15 DIAGNOSIS — H348122 Central retinal vein occlusion, left eye, stable: Secondary | ICD-10-CM | POA: Diagnosis not present

## 2017-01-15 DIAGNOSIS — E119 Type 2 diabetes mellitus without complications: Secondary | ICD-10-CM | POA: Diagnosis not present

## 2017-01-21 DIAGNOSIS — H25812 Combined forms of age-related cataract, left eye: Secondary | ICD-10-CM | POA: Diagnosis not present

## 2017-01-21 DIAGNOSIS — H2512 Age-related nuclear cataract, left eye: Secondary | ICD-10-CM | POA: Diagnosis not present

## 2017-02-04 DIAGNOSIS — D649 Anemia, unspecified: Secondary | ICD-10-CM | POA: Diagnosis not present

## 2017-02-04 DIAGNOSIS — I10 Essential (primary) hypertension: Secondary | ICD-10-CM | POA: Diagnosis not present

## 2017-02-04 DIAGNOSIS — Z1389 Encounter for screening for other disorder: Secondary | ICD-10-CM | POA: Diagnosis not present

## 2017-02-04 DIAGNOSIS — E1165 Type 2 diabetes mellitus with hyperglycemia: Secondary | ICD-10-CM | POA: Diagnosis not present

## 2017-02-04 DIAGNOSIS — Z6834 Body mass index (BMI) 34.0-34.9, adult: Secondary | ICD-10-CM | POA: Diagnosis not present

## 2017-02-04 DIAGNOSIS — E6609 Other obesity due to excess calories: Secondary | ICD-10-CM | POA: Diagnosis not present

## 2017-02-04 DIAGNOSIS — N200 Calculus of kidney: Secondary | ICD-10-CM | POA: Diagnosis not present

## 2017-02-04 DIAGNOSIS — Z0001 Encounter for general adult medical examination with abnormal findings: Secondary | ICD-10-CM | POA: Diagnosis not present

## 2017-02-04 DIAGNOSIS — E782 Mixed hyperlipidemia: Secondary | ICD-10-CM | POA: Diagnosis not present

## 2017-02-04 DIAGNOSIS — N182 Chronic kidney disease, stage 2 (mild): Secondary | ICD-10-CM | POA: Diagnosis not present

## 2017-02-08 DIAGNOSIS — Z1211 Encounter for screening for malignant neoplasm of colon: Secondary | ICD-10-CM | POA: Diagnosis not present

## 2017-02-12 DIAGNOSIS — N182 Chronic kidney disease, stage 2 (mild): Secondary | ICD-10-CM | POA: Diagnosis not present

## 2017-02-12 DIAGNOSIS — E782 Mixed hyperlipidemia: Secondary | ICD-10-CM | POA: Diagnosis not present

## 2017-02-12 DIAGNOSIS — D510 Vitamin B12 deficiency anemia due to intrinsic factor deficiency: Secondary | ICD-10-CM | POA: Diagnosis not present

## 2017-02-24 ENCOUNTER — Other Ambulatory Visit (HOSPITAL_COMMUNITY): Payer: Self-pay | Admitting: Family Medicine

## 2017-02-24 DIAGNOSIS — Z1231 Encounter for screening mammogram for malignant neoplasm of breast: Secondary | ICD-10-CM

## 2017-02-27 ENCOUNTER — Ambulatory Visit (HOSPITAL_COMMUNITY)
Admission: RE | Admit: 2017-02-27 | Discharge: 2017-02-27 | Disposition: A | Discharge status: Home / Self Care | Payer: PPO | Source: Ambulatory Visit | Attending: Family Medicine | Admitting: Family Medicine

## 2017-02-27 DIAGNOSIS — Z1231 Encounter for screening mammogram for malignant neoplasm of breast: Secondary | ICD-10-CM

## 2017-03-12 ENCOUNTER — Other Ambulatory Visit (HOSPITAL_COMMUNITY): Payer: Self-pay | Admitting: Family Medicine

## 2017-03-12 DIAGNOSIS — J984 Other disorders of lung: Secondary | ICD-10-CM

## 2017-03-17 DIAGNOSIS — D510 Vitamin B12 deficiency anemia due to intrinsic factor deficiency: Secondary | ICD-10-CM | POA: Diagnosis not present

## 2017-03-20 ENCOUNTER — Ambulatory Visit (HOSPITAL_COMMUNITY)
Admission: RE | Admit: 2017-03-20 | Discharge: 2017-03-20 | Disposition: A | Discharge status: Home / Self Care | Payer: PPO | Source: Ambulatory Visit | Attending: Family Medicine | Admitting: Family Medicine

## 2017-03-20 DIAGNOSIS — E049 Nontoxic goiter, unspecified: Secondary | ICD-10-CM | POA: Insufficient documentation

## 2017-03-20 DIAGNOSIS — J984 Other disorders of lung: Secondary | ICD-10-CM | POA: Insufficient documentation

## 2017-03-20 DIAGNOSIS — I700 Atherosclerosis of aorta: Secondary | ICD-10-CM | POA: Diagnosis not present

## 2017-03-20 DIAGNOSIS — R918 Other nonspecific abnormal finding of lung field: Secondary | ICD-10-CM | POA: Diagnosis not present

## 2017-03-20 DIAGNOSIS — K449 Diaphragmatic hernia without obstruction or gangrene: Secondary | ICD-10-CM | POA: Diagnosis not present

## 2017-03-24 IMAGING — CR DG PORTABLE PELVIS
1 series · 1 of 1 positions shown · non-contrast
Comparison: None.

CLINICAL DATA: Revision left hip arthroplasty

EXAM:
PORTABLE PELVIS 1-2 VIEWS

[AP]
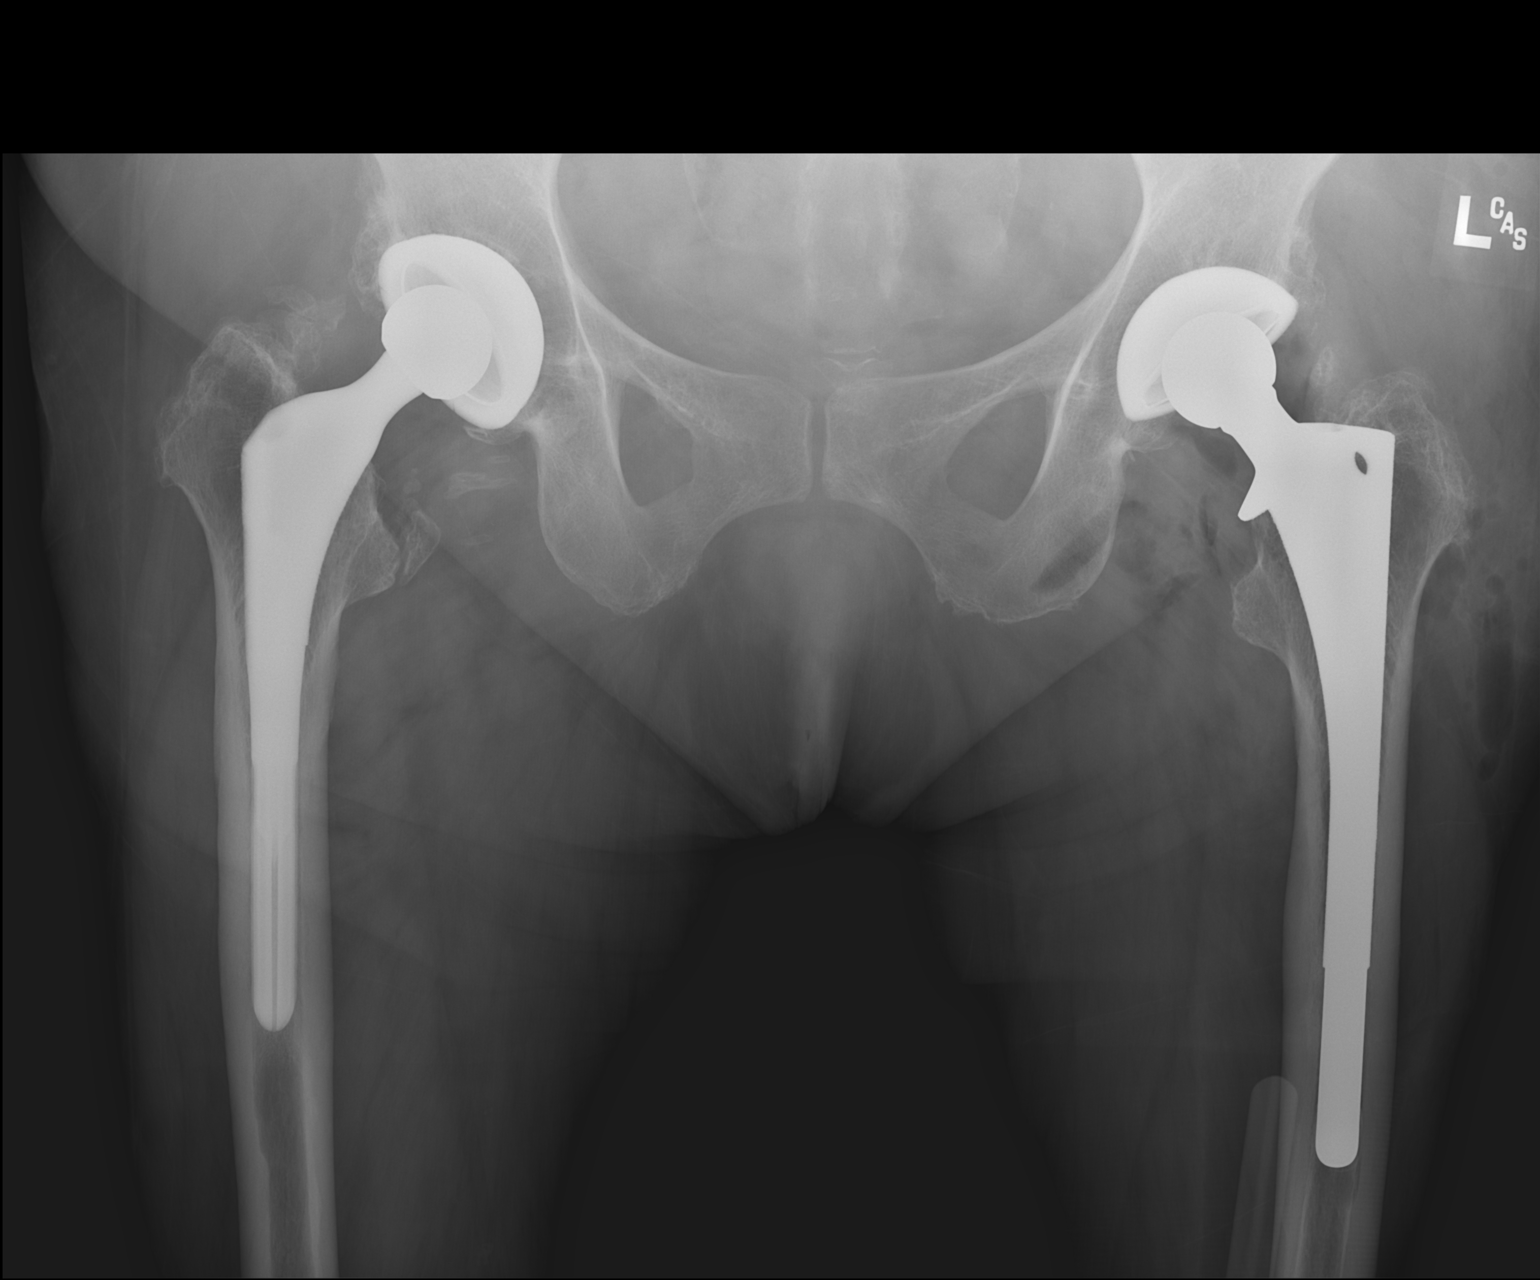

[1 of 1 positions shown; findings below may reference images not displayed]

FINDINGS: There is interval revision of a left total hip arthroplasty without
failure complication. Postsurgical changes in the surrounding soft
tissues.

There is a right total hip arthroplasty without failure
complication.
IMPRESSION: Interval revision of left total hip arthroplasty.

## 2017-04-16 DIAGNOSIS — E876 Hypokalemia: Secondary | ICD-10-CM | POA: Diagnosis not present

## 2017-05-15 DIAGNOSIS — D510 Vitamin B12 deficiency anemia due to intrinsic factor deficiency: Secondary | ICD-10-CM | POA: Diagnosis not present

## 2017-05-30 DIAGNOSIS — H401131 Primary open-angle glaucoma, bilateral, mild stage: Secondary | ICD-10-CM | POA: Diagnosis not present

## 2017-06-13 DIAGNOSIS — D510 Vitamin B12 deficiency anemia due to intrinsic factor deficiency: Secondary | ICD-10-CM | POA: Diagnosis not present

## 2017-07-01 DIAGNOSIS — N200 Calculus of kidney: Secondary | ICD-10-CM | POA: Diagnosis not present

## 2017-07-14 DIAGNOSIS — E538 Deficiency of other specified B group vitamins: Secondary | ICD-10-CM | POA: Diagnosis not present

## 2017-07-15 DIAGNOSIS — E1165 Type 2 diabetes mellitus with hyperglycemia: Secondary | ICD-10-CM | POA: Diagnosis not present

## 2017-07-15 DIAGNOSIS — H43813 Vitreous degeneration, bilateral: Secondary | ICD-10-CM | POA: Diagnosis not present

## 2017-07-15 DIAGNOSIS — E119 Type 2 diabetes mellitus without complications: Secondary | ICD-10-CM | POA: Diagnosis not present

## 2017-07-15 DIAGNOSIS — H348122 Central retinal vein occlusion, left eye, stable: Secondary | ICD-10-CM | POA: Diagnosis not present

## 2017-08-13 DIAGNOSIS — E538 Deficiency of other specified B group vitamins: Secondary | ICD-10-CM | POA: Diagnosis not present

## 2017-09-10 DIAGNOSIS — E538 Deficiency of other specified B group vitamins: Secondary | ICD-10-CM | POA: Diagnosis not present

## 2017-10-13 DIAGNOSIS — D510 Vitamin B12 deficiency anemia due to intrinsic factor deficiency: Secondary | ICD-10-CM | POA: Diagnosis not present

## 2017-11-11 DIAGNOSIS — E782 Mixed hyperlipidemia: Secondary | ICD-10-CM | POA: Diagnosis not present

## 2017-11-11 DIAGNOSIS — D510 Vitamin B12 deficiency anemia due to intrinsic factor deficiency: Secondary | ICD-10-CM | POA: Diagnosis not present

## 2017-11-11 DIAGNOSIS — E049 Nontoxic goiter, unspecified: Secondary | ICD-10-CM | POA: Diagnosis not present

## 2017-11-11 DIAGNOSIS — N182 Chronic kidney disease, stage 2 (mild): Secondary | ICD-10-CM | POA: Diagnosis not present

## 2017-11-11 DIAGNOSIS — I10 Essential (primary) hypertension: Secondary | ICD-10-CM | POA: Diagnosis not present

## 2017-11-11 DIAGNOSIS — E6609 Other obesity due to excess calories: Secondary | ICD-10-CM | POA: Diagnosis not present

## 2017-11-11 DIAGNOSIS — J449 Chronic obstructive pulmonary disease, unspecified: Secondary | ICD-10-CM | POA: Diagnosis not present

## 2017-11-11 DIAGNOSIS — E1165 Type 2 diabetes mellitus with hyperglycemia: Secondary | ICD-10-CM | POA: Diagnosis not present

## 2017-11-11 DIAGNOSIS — Z6833 Body mass index (BMI) 33.0-33.9, adult: Secondary | ICD-10-CM | POA: Diagnosis not present

## 2017-11-24 DIAGNOSIS — H401131 Primary open-angle glaucoma, bilateral, mild stage: Secondary | ICD-10-CM | POA: Diagnosis not present

## 2017-11-24 DIAGNOSIS — E119 Type 2 diabetes mellitus without complications: Secondary | ICD-10-CM | POA: Diagnosis not present

## 2017-11-24 DIAGNOSIS — Z961 Presence of intraocular lens: Secondary | ICD-10-CM | POA: Diagnosis not present

## 2017-12-12 DIAGNOSIS — E538 Deficiency of other specified B group vitamins: Secondary | ICD-10-CM | POA: Diagnosis not present

## 2017-12-18 DIAGNOSIS — Z6833 Body mass index (BMI) 33.0-33.9, adult: Secondary | ICD-10-CM | POA: Diagnosis not present

## 2017-12-18 DIAGNOSIS — K449 Diaphragmatic hernia without obstruction or gangrene: Secondary | ICD-10-CM | POA: Diagnosis not present

## 2017-12-18 DIAGNOSIS — R918 Other nonspecific abnormal finding of lung field: Secondary | ICD-10-CM | POA: Diagnosis not present

## 2017-12-18 DIAGNOSIS — I700 Atherosclerosis of aorta: Secondary | ICD-10-CM | POA: Diagnosis not present

## 2017-12-18 DIAGNOSIS — E6609 Other obesity due to excess calories: Secondary | ICD-10-CM | POA: Diagnosis not present

## 2017-12-18 DIAGNOSIS — I2510 Atherosclerotic heart disease of native coronary artery without angina pectoris: Secondary | ICD-10-CM | POA: Diagnosis not present

## 2017-12-18 DIAGNOSIS — E1165 Type 2 diabetes mellitus with hyperglycemia: Secondary | ICD-10-CM | POA: Diagnosis not present

## 2017-12-18 DIAGNOSIS — I517 Cardiomegaly: Secondary | ICD-10-CM | POA: Diagnosis not present

## 2017-12-18 DIAGNOSIS — E049 Nontoxic goiter, unspecified: Secondary | ICD-10-CM | POA: Diagnosis not present

## 2018-01-01 DIAGNOSIS — E6609 Other obesity due to excess calories: Secondary | ICD-10-CM | POA: Diagnosis not present

## 2018-01-01 DIAGNOSIS — R918 Other nonspecific abnormal finding of lung field: Secondary | ICD-10-CM | POA: Diagnosis not present

## 2018-01-01 DIAGNOSIS — E1165 Type 2 diabetes mellitus with hyperglycemia: Secondary | ICD-10-CM | POA: Diagnosis not present

## 2018-01-01 DIAGNOSIS — Z6833 Body mass index (BMI) 33.0-33.9, adult: Secondary | ICD-10-CM | POA: Diagnosis not present

## 2018-01-01 DIAGNOSIS — E049 Nontoxic goiter, unspecified: Secondary | ICD-10-CM | POA: Diagnosis not present

## 2018-01-13 DIAGNOSIS — D510 Vitamin B12 deficiency anemia due to intrinsic factor deficiency: Secondary | ICD-10-CM | POA: Diagnosis not present

## 2018-01-20 DIAGNOSIS — H348122 Central retinal vein occlusion, left eye, stable: Secondary | ICD-10-CM | POA: Diagnosis not present

## 2018-01-20 DIAGNOSIS — E119 Type 2 diabetes mellitus without complications: Secondary | ICD-10-CM | POA: Diagnosis not present

## 2018-01-20 DIAGNOSIS — H43813 Vitreous degeneration, bilateral: Secondary | ICD-10-CM | POA: Diagnosis not present

## 2018-02-05 DIAGNOSIS — E6609 Other obesity due to excess calories: Secondary | ICD-10-CM | POA: Diagnosis not present

## 2018-02-05 DIAGNOSIS — Z1389 Encounter for screening for other disorder: Secondary | ICD-10-CM | POA: Diagnosis not present

## 2018-02-05 DIAGNOSIS — Z0001 Encounter for general adult medical examination with abnormal findings: Secondary | ICD-10-CM | POA: Diagnosis not present

## 2018-02-05 DIAGNOSIS — E1165 Type 2 diabetes mellitus with hyperglycemia: Secondary | ICD-10-CM | POA: Diagnosis not present

## 2018-02-05 DIAGNOSIS — Z6833 Body mass index (BMI) 33.0-33.9, adult: Secondary | ICD-10-CM | POA: Diagnosis not present

## 2018-02-11 DIAGNOSIS — D510 Vitamin B12 deficiency anemia due to intrinsic factor deficiency: Secondary | ICD-10-CM | POA: Diagnosis not present

## 2018-02-13 ENCOUNTER — Other Ambulatory Visit (HOSPITAL_COMMUNITY): Payer: Self-pay | Admitting: Family Medicine

## 2018-02-13 DIAGNOSIS — Z1231 Encounter for screening mammogram for malignant neoplasm of breast: Secondary | ICD-10-CM

## 2018-02-19 ENCOUNTER — Ambulatory Visit (HOSPITAL_COMMUNITY)
Admission: RE | Admit: 2018-02-19 | Discharge: 2018-02-19 | Disposition: A | Discharge status: Home / Self Care | Payer: PPO | Source: Ambulatory Visit | Attending: Family Medicine | Admitting: Family Medicine

## 2018-02-19 DIAGNOSIS — Z1231 Encounter for screening mammogram for malignant neoplasm of breast: Secondary | ICD-10-CM | POA: Diagnosis not present

## 2018-02-20 DIAGNOSIS — Z23 Encounter for immunization: Secondary | ICD-10-CM | POA: Diagnosis not present

## 2018-03-16 DIAGNOSIS — D510 Vitamin B12 deficiency anemia due to intrinsic factor deficiency: Secondary | ICD-10-CM | POA: Diagnosis not present

## 2018-04-02 DIAGNOSIS — I10 Essential (primary) hypertension: Secondary | ICD-10-CM | POA: Diagnosis not present

## 2018-04-02 DIAGNOSIS — Z6833 Body mass index (BMI) 33.0-33.9, adult: Secondary | ICD-10-CM | POA: Diagnosis not present

## 2018-04-02 DIAGNOSIS — E1165 Type 2 diabetes mellitus with hyperglycemia: Secondary | ICD-10-CM | POA: Diagnosis not present

## 2018-04-02 DIAGNOSIS — Z1389 Encounter for screening for other disorder: Secondary | ICD-10-CM | POA: Diagnosis not present

## 2018-04-02 DIAGNOSIS — N182 Chronic kidney disease, stage 2 (mild): Secondary | ICD-10-CM | POA: Diagnosis not present

## 2018-04-02 DIAGNOSIS — E6609 Other obesity due to excess calories: Secondary | ICD-10-CM | POA: Diagnosis not present

## 2018-04-02 DIAGNOSIS — J449 Chronic obstructive pulmonary disease, unspecified: Secondary | ICD-10-CM | POA: Diagnosis not present

## 2018-04-02 DIAGNOSIS — E782 Mixed hyperlipidemia: Secondary | ICD-10-CM | POA: Diagnosis not present

## 2018-04-02 DIAGNOSIS — E7849 Other hyperlipidemia: Secondary | ICD-10-CM | POA: Diagnosis not present

## 2018-04-13 DIAGNOSIS — D510 Vitamin B12 deficiency anemia due to intrinsic factor deficiency: Secondary | ICD-10-CM | POA: Diagnosis not present

## 2018-05-14 DIAGNOSIS — E538 Deficiency of other specified B group vitamins: Secondary | ICD-10-CM | POA: Diagnosis not present

## 2018-05-29 DIAGNOSIS — H401131 Primary open-angle glaucoma, bilateral, mild stage: Secondary | ICD-10-CM | POA: Diagnosis not present

## 2018-06-16 DIAGNOSIS — D510 Vitamin B12 deficiency anemia due to intrinsic factor deficiency: Secondary | ICD-10-CM | POA: Diagnosis not present

## 2018-07-10 ENCOUNTER — Other Ambulatory Visit (HOSPITAL_COMMUNITY)
Admission: RE | Admit: 2018-07-10 | Discharge: 2018-07-10 | Disposition: A | Discharge status: Home / Self Care | Payer: PPO | Source: Other Acute Inpatient Hospital | Attending: Urology | Admitting: Urology

## 2018-07-10 ENCOUNTER — Ambulatory Visit: Payer: PPO | Admitting: Urology

## 2018-07-10 DIAGNOSIS — N182 Chronic kidney disease, stage 2 (mild): Secondary | ICD-10-CM

## 2018-07-10 DIAGNOSIS — R82991 Hypocitraturia: Secondary | ICD-10-CM | POA: Diagnosis not present

## 2018-07-10 DIAGNOSIS — N200 Calculus of kidney: Secondary | ICD-10-CM | POA: Diagnosis not present

## 2018-07-10 DIAGNOSIS — E790 Hyperuricemia without signs of inflammatory arthritis and tophaceous disease: Secondary | ICD-10-CM | POA: Diagnosis not present

## 2018-07-10 LAB — URINALYSIS, COMPLETE (UACMP) WITH MICROSCOPIC
Bilirubin Urine: NEGATIVE
Glucose, UA: NEGATIVE mg/dL
Ketones, ur: NEGATIVE mg/dL
Nitrite: NEGATIVE
Protein, ur: NEGATIVE mg/dL
Specific Gravity, Urine: 1.018 (ref 1.005–1.030)
pH: 5 (ref 5.0–8.0)

## 2018-07-12 LAB — URINE CULTURE: Culture: 30000 — AB

## 2018-07-13 DIAGNOSIS — D510 Vitamin B12 deficiency anemia due to intrinsic factor deficiency: Secondary | ICD-10-CM | POA: Diagnosis not present

## 2018-08-12 DIAGNOSIS — N2581 Secondary hyperparathyroidism of renal origin: Secondary | ICD-10-CM | POA: Diagnosis not present

## 2018-08-12 DIAGNOSIS — M79605 Pain in left leg: Secondary | ICD-10-CM | POA: Diagnosis not present

## 2018-08-12 DIAGNOSIS — Z79899 Other long term (current) drug therapy: Secondary | ICD-10-CM | POA: Diagnosis not present

## 2018-08-12 DIAGNOSIS — I129 Hypertensive chronic kidney disease with stage 1 through stage 4 chronic kidney disease, or unspecified chronic kidney disease: Secondary | ICD-10-CM | POA: Diagnosis not present

## 2018-08-12 DIAGNOSIS — E538 Deficiency of other specified B group vitamins: Secondary | ICD-10-CM | POA: Diagnosis not present

## 2018-08-12 DIAGNOSIS — E039 Hypothyroidism, unspecified: Secondary | ICD-10-CM | POA: Diagnosis not present

## 2018-08-12 DIAGNOSIS — N183 Chronic kidney disease, stage 3 unspecified: Secondary | ICD-10-CM | POA: Diagnosis not present

## 2018-08-12 DIAGNOSIS — I10 Essential (primary) hypertension: Secondary | ICD-10-CM | POA: Diagnosis not present

## 2018-08-12 DIAGNOSIS — Z6828 Body mass index (BMI) 28.0-28.9, adult: Secondary | ICD-10-CM | POA: Diagnosis not present

## 2018-08-12 DIAGNOSIS — R51 Headache: Secondary | ICD-10-CM | POA: Diagnosis not present

## 2018-08-12 DIAGNOSIS — E785 Hyperlipidemia, unspecified: Secondary | ICD-10-CM | POA: Diagnosis not present

## 2018-08-12 DIAGNOSIS — M79604 Pain in right leg: Secondary | ICD-10-CM | POA: Diagnosis not present

## 2018-08-17 ENCOUNTER — Other Ambulatory Visit (HOSPITAL_COMMUNITY): Payer: Self-pay | Admitting: Urology

## 2018-08-17 DIAGNOSIS — Z0001 Encounter for general adult medical examination with abnormal findings: Secondary | ICD-10-CM | POA: Diagnosis not present

## 2018-08-17 DIAGNOSIS — J449 Chronic obstructive pulmonary disease, unspecified: Secondary | ICD-10-CM | POA: Diagnosis not present

## 2018-08-17 DIAGNOSIS — I10 Essential (primary) hypertension: Secondary | ICD-10-CM | POA: Diagnosis not present

## 2018-08-17 DIAGNOSIS — Z03818 Encounter for observation for suspected exposure to other biological agents ruled out: Secondary | ICD-10-CM | POA: Diagnosis not present

## 2018-08-17 DIAGNOSIS — K921 Melena: Secondary | ICD-10-CM | POA: Diagnosis not present

## 2018-08-17 DIAGNOSIS — D509 Iron deficiency anemia, unspecified: Secondary | ICD-10-CM | POA: Diagnosis not present

## 2018-08-17 DIAGNOSIS — E291 Testicular hypofunction: Secondary | ICD-10-CM | POA: Diagnosis not present

## 2018-08-17 DIAGNOSIS — N200 Calculus of kidney: Secondary | ICD-10-CM

## 2018-08-17 DIAGNOSIS — F3162 Bipolar disorder, current episode mixed, moderate: Secondary | ICD-10-CM | POA: Diagnosis not present

## 2018-08-17 DIAGNOSIS — E1165 Type 2 diabetes mellitus with hyperglycemia: Secondary | ICD-10-CM | POA: Diagnosis not present

## 2018-08-17 DIAGNOSIS — M5416 Radiculopathy, lumbar region: Secondary | ICD-10-CM | POA: Diagnosis not present

## 2018-08-17 DIAGNOSIS — Z681 Body mass index (BMI) 19 or less, adult: Secondary | ICD-10-CM | POA: Diagnosis not present

## 2018-08-17 DIAGNOSIS — Z1389 Encounter for screening for other disorder: Secondary | ICD-10-CM | POA: Diagnosis not present

## 2018-09-11 DIAGNOSIS — E538 Deficiency of other specified B group vitamins: Secondary | ICD-10-CM | POA: Diagnosis not present

## 2018-09-21 ENCOUNTER — Other Ambulatory Visit: Payer: Self-pay

## 2018-09-21 ENCOUNTER — Ambulatory Visit (HOSPITAL_COMMUNITY)
Admission: RE | Admit: 2018-09-21 | Discharge: 2018-09-21 | Disposition: A | Discharge status: Home / Self Care | Payer: PPO | Source: Ambulatory Visit | Attending: Urology | Admitting: Urology

## 2018-09-21 DIAGNOSIS — N200 Calculus of kidney: Secondary | ICD-10-CM | POA: Diagnosis not present

## 2018-10-12 ENCOUNTER — Other Ambulatory Visit: Payer: Self-pay

## 2018-10-12 ENCOUNTER — Ambulatory Visit (HOSPITAL_COMMUNITY)
Admission: RE | Admit: 2018-10-12 | Discharge: 2018-10-12 | Disposition: A | Discharge status: Home / Self Care | Payer: PPO | Source: Ambulatory Visit | Attending: Family Medicine | Admitting: Family Medicine

## 2018-10-12 ENCOUNTER — Other Ambulatory Visit (HOSPITAL_COMMUNITY): Payer: Self-pay | Admitting: Family Medicine

## 2018-10-12 ENCOUNTER — Other Ambulatory Visit (HOSPITAL_COMMUNITY): Payer: Self-pay | Admitting: Urology

## 2018-10-12 DIAGNOSIS — N200 Calculus of kidney: Secondary | ICD-10-CM | POA: Diagnosis not present

## 2018-10-12 DIAGNOSIS — D510 Vitamin B12 deficiency anemia due to intrinsic factor deficiency: Secondary | ICD-10-CM | POA: Diagnosis not present

## 2018-10-23 ENCOUNTER — Ambulatory Visit (INDEPENDENT_AMBULATORY_CARE_PROVIDER_SITE_OTHER): Payer: PPO | Admitting: Urology

## 2018-10-23 DIAGNOSIS — R82991 Hypocitraturia: Secondary | ICD-10-CM | POA: Diagnosis not present

## 2018-10-23 DIAGNOSIS — N200 Calculus of kidney: Secondary | ICD-10-CM | POA: Diagnosis not present

## 2018-10-28 ENCOUNTER — Other Ambulatory Visit: Payer: Self-pay | Admitting: Urology

## 2018-11-11 DIAGNOSIS — D510 Vitamin B12 deficiency anemia due to intrinsic factor deficiency: Secondary | ICD-10-CM | POA: Diagnosis not present

## 2018-11-16 ENCOUNTER — Ambulatory Visit (HOSPITAL_COMMUNITY)
Admission: RE | Admit: 2018-11-16 | Discharge: 2018-11-16 | Disposition: A | Discharge status: Home / Self Care | Payer: PPO | Attending: Urology | Admitting: Urology

## 2018-11-16 ENCOUNTER — Encounter (HOSPITAL_COMMUNITY): Payer: Self-pay | Admitting: *Deleted

## 2018-11-16 ENCOUNTER — Other Ambulatory Visit: Payer: Self-pay

## 2018-11-16 ENCOUNTER — Ambulatory Visit (HOSPITAL_COMMUNITY): Payer: PPO

## 2018-11-16 ENCOUNTER — Encounter (HOSPITAL_COMMUNITY)
Admission: RE | Disposition: A | Discharge status: Home / Self Care | Payer: Self-pay | Source: Home / Self Care | Attending: Urology

## 2018-11-16 DIAGNOSIS — F419 Anxiety disorder, unspecified: Secondary | ICD-10-CM | POA: Diagnosis not present

## 2018-11-16 DIAGNOSIS — M1990 Unspecified osteoarthritis, unspecified site: Secondary | ICD-10-CM | POA: Diagnosis not present

## 2018-11-16 DIAGNOSIS — Z87891 Personal history of nicotine dependence: Secondary | ICD-10-CM | POA: Diagnosis not present

## 2018-11-16 DIAGNOSIS — E119 Type 2 diabetes mellitus without complications: Secondary | ICD-10-CM | POA: Diagnosis not present

## 2018-11-16 DIAGNOSIS — J449 Chronic obstructive pulmonary disease, unspecified: Secondary | ICD-10-CM | POA: Diagnosis not present

## 2018-11-16 DIAGNOSIS — I10 Essential (primary) hypertension: Secondary | ICD-10-CM | POA: Diagnosis not present

## 2018-11-16 DIAGNOSIS — E669 Obesity, unspecified: Secondary | ICD-10-CM | POA: Diagnosis not present

## 2018-11-16 DIAGNOSIS — N200 Calculus of kidney: Secondary | ICD-10-CM | POA: Diagnosis not present

## 2018-11-16 HISTORY — PX: EXTRACORPOREAL SHOCK WAVE LITHOTRIPSY: SHX1557

## 2018-11-16 LAB — GLUCOSE, CAPILLARY: Glucose-Capillary: 127 mg/dL — ABNORMAL HIGH (ref 70–99)

## 2018-11-16 SURGERY — LITHOTRIPSY, ESWL
Anesthesia: LOCAL | Laterality: Left

## 2018-11-16 MED ORDER — CIPROFLOXACIN HCL 500 MG PO TABS
500.0000 mg | ORAL_TABLET | ORAL | Status: AC
  Administered 2018-11-16: 500 mg via ORAL
  Filled 2018-11-16: qty 1

## 2018-11-16 MED ORDER — SODIUM CHLORIDE 0.9 % IV SOLN
INTRAVENOUS | Status: DC
  Administered 2018-11-16: 07:00:00 via INTRAVENOUS

## 2018-11-16 MED ORDER — TAMSULOSIN HCL 0.4 MG PO CAPS: 0.4000 mg | ORAL_CAPSULE | Freq: Every day | ORAL | 3 refills | Status: DC

## 2018-11-16 MED ORDER — DIAZEPAM 5 MG PO TABS
10.0000 mg | ORAL_TABLET | ORAL | Status: AC
  Administered 2018-11-16: 10 mg via ORAL
  Filled 2018-11-16: qty 2

## 2018-11-16 MED ORDER — HYDROCODONE-ACETAMINOPHEN 5-325 MG PO TABS: 1.0000 | ORAL_TABLET | ORAL | 0 refills | Status: DC | PRN

## 2018-11-16 MED ORDER — DIPHENHYDRAMINE HCL 25 MG PO CAPS
25.0000 mg | ORAL_CAPSULE | ORAL | Status: AC
  Administered 2018-11-16: 25 mg via ORAL
  Filled 2018-11-16: qty 1

## 2018-11-16 MED ORDER — ONDANSETRON HCL 4 MG PO TABS: 4.0000 mg | ORAL_TABLET | Freq: Every day | ORAL | 1 refills | Status: DC | PRN

## 2018-11-16 NOTE — Op Note (Signed)
See [facility] OP note scanned into chart. Also because of the size, density, location and other factors that cannot be anticipated I feel this will likely be a staged procedure. This fact supersedes any indication in the scanned [facility] operative note to the contrary.  

## 2018-11-16 NOTE — Discharge Instructions (Signed)

## 2018-11-16 NOTE — H&P (Signed)
See scanned H&P

## 2018-11-17 ENCOUNTER — Encounter (HOSPITAL_COMMUNITY): Payer: Self-pay | Admitting: Urology

## 2018-11-23 DIAGNOSIS — I10 Essential (primary) hypertension: Secondary | ICD-10-CM | POA: Diagnosis not present

## 2018-11-23 DIAGNOSIS — Z23 Encounter for immunization: Secondary | ICD-10-CM | POA: Diagnosis not present

## 2018-11-23 DIAGNOSIS — Z1389 Encounter for screening for other disorder: Secondary | ICD-10-CM | POA: Diagnosis not present

## 2018-11-23 DIAGNOSIS — E119 Type 2 diabetes mellitus without complications: Secondary | ICD-10-CM | POA: Diagnosis not present

## 2018-11-23 DIAGNOSIS — E6609 Other obesity due to excess calories: Secondary | ICD-10-CM | POA: Diagnosis not present

## 2018-11-23 DIAGNOSIS — E7849 Other hyperlipidemia: Secondary | ICD-10-CM | POA: Diagnosis not present

## 2018-11-23 DIAGNOSIS — Z6833 Body mass index (BMI) 33.0-33.9, adult: Secondary | ICD-10-CM | POA: Diagnosis not present

## 2018-12-02 DIAGNOSIS — H401131 Primary open-angle glaucoma, bilateral, mild stage: Secondary | ICD-10-CM | POA: Diagnosis not present

## 2018-12-02 DIAGNOSIS — Z961 Presence of intraocular lens: Secondary | ICD-10-CM | POA: Diagnosis not present

## 2018-12-02 DIAGNOSIS — E119 Type 2 diabetes mellitus without complications: Secondary | ICD-10-CM | POA: Diagnosis not present

## 2018-12-07 ENCOUNTER — Other Ambulatory Visit (HOSPITAL_COMMUNITY): Payer: Self-pay | Admitting: Nurse Practitioner

## 2018-12-07 ENCOUNTER — Other Ambulatory Visit: Payer: Self-pay

## 2018-12-07 ENCOUNTER — Ambulatory Visit (HOSPITAL_COMMUNITY)
Admission: RE | Admit: 2018-12-07 | Discharge: 2018-12-07 | Disposition: A | Discharge status: Home / Self Care | Payer: PPO | Source: Ambulatory Visit | Attending: Nurse Practitioner | Admitting: Nurse Practitioner

## 2018-12-07 DIAGNOSIS — N200 Calculus of kidney: Secondary | ICD-10-CM

## 2018-12-11 ENCOUNTER — Other Ambulatory Visit: Payer: Self-pay

## 2018-12-11 ENCOUNTER — Ambulatory Visit: Payer: PPO | Admitting: Urology

## 2018-12-14 DIAGNOSIS — D510 Vitamin B12 deficiency anemia due to intrinsic factor deficiency: Secondary | ICD-10-CM | POA: Diagnosis not present

## 2018-12-21 ENCOUNTER — Other Ambulatory Visit: Payer: Self-pay | Admitting: Urology

## 2018-12-22 ENCOUNTER — Ambulatory Visit: Payer: PPO | Admitting: General Surgery

## 2018-12-22 ENCOUNTER — Encounter: Payer: Self-pay | Admitting: General Surgery

## 2018-12-22 ENCOUNTER — Ambulatory Visit: Payer: Self-pay | Admitting: General Surgery

## 2018-12-22 ENCOUNTER — Other Ambulatory Visit: Payer: Self-pay

## 2018-12-22 VITALS — BP 129/77 | HR 78 | Temp 98.6°F | Resp 16 | Ht 63.0 in | Wt 187.0 lb

## 2018-12-22 DIAGNOSIS — Z1211 Encounter for screening for malignant neoplasm of colon: Secondary | ICD-10-CM | POA: Diagnosis not present

## 2018-12-22 MED ORDER — PEG 3350-KCL-NA BICARB-NACL 420 G PO SOLR: 4000.0000 mL | Freq: Once | ORAL | 0 refills | Status: AC

## 2018-12-22 NOTE — Progress Notes (Signed)
Gwendolyn CancelWanda S Snyder; [id number]; 12/20/1950   HPI Patient is a 68 year old white female who was referred to my care by Dr. Phillips OdorGolding for a screening colonoscopy.  Patient last had a screening colonoscopy over 10 years ago.  She denies abdominal pain, fevers, blood per rectum, weight change, abnormal diarrhea or constipation, or family history of colon carcinoma.  She currently has 0 out of 10 abdominal pain. Past Medical History:  Diagnosis Date  . Anemia   . Anxiety   . Arthritis   . Bilateral carpal tunnel syndrome    right hand- surgery , left hand has not and left hand goes to sleep   . Cataracts, bilateral   . Complication of anesthesia    once after surgery felt like she could not breathe- 11/09/15- surgery greater than 11 years ago  . COPD (chronic obstructive pulmonary disease) (HCC)   . Diabetes mellitus without complication (HCC)    diet controlled , type II   . Family history of adverse reaction to anesthesia    sister- confused  . Glaucoma   . History of hiatal hernia   . History of kidney stones   . Hypertension   . Shortness of breath dyspnea    with exertion    Past Surgical History:  Procedure Laterality Date  . ABDOMINAL HYSTERECTOMY    . CARPAL TUNNEL RELEASE Right   . COLONOSCOPY    . CYSTOSCOPY/RETROGRADE/URETEROSCOPY/STONE EXTRACTION WITH BASKET Left 09/17/2016   Procedure: CYSTOSCOPY/RETROGRADE/URETEROSCOPY/STONE EXTRACTION WITH BASKET/ STENT REMOVAL;  Surgeon: Bjorn PippinWrenn, [name], MD;  Location: [facility];  Service: Urology;  Laterality: Left;  . EXTRACORPOREAL SHOCK WAVE LITHOTRIPSY Left 11/16/2018   Procedure: EXTRACORPOREAL SHOCK WAVE LITHOTRIPSY (ESWL);  Surgeon: Crista ElliotBell, Eugene D III, MD;  Location: [facility];  Service: Urology;  Laterality: Left;  . IR URETERAL STENT LEFT NEW ACCESS W/O SEP NEPHROSTOMY CATH  08/20/2016  . JOINT REPLACEMENT    . NEPHROLITHOTOMY Left 08/20/2016   Procedure: LEFT NEPHROLITHOTOMY PERCUTANEOUS FIRST STAGE;  Surgeon: Bjorn PippinJohn [name], MD;  Location: [facility];   Service: Urology;  Laterality: Left;  . NEPHROLITHOTOMY Left 08/22/2016   Procedure: LEFT NEPHROLITHOTOMY PERCUTANEOUS SECOND LOOK;  Surgeon: Bjorn PippinJohn [name], MD;  Location: [facility];  Service: Urology;  Laterality: Left;  . Removal kidney stone    . TOTAL HIP ARTHROPLASTY Bilateral   . TOTAL HIP REVISION Left 11/22/2015   Procedure: Revision Left Total Hip Arthroplasty, Poly and Newman PiesBall Exchange;  Surgeon: Eldred MangesMark C Yates, MD;  [facility];  Service: Orthopedics;  Laterality: Left;  . TOTAL KNEE ARTHROPLASTY Right     History reviewed. No pertinent family history.  Current Outpatient Medications on File Prior to Visit  Medication Sig Dispense Refill  . acetaminophen (TYLENOL) 650 MG CR tablet Take 1,300 mg by mouth 2 (two) times daily.    Marland Kitchen. amLODipine (NORVASC) 5 MG tablet Take 5 mg by mouth daily.     . cholecalciferol (VITAMIN D) 1000 units tablet Take 1,000 Units by mouth daily.    Marland Kitchen. ezetimibe (ZETIA) 10 MG tablet Take 10 mg by mouth daily.    . ferrous sulfate 325 (65 FE) MG tablet Take 325 mg by mouth every Monday, Wednesday, and Friday. In the morning    . HYDROcodone-acetaminophen (NORCO) 5-325 MG tablet Take 1 tablet by mouth every 4 (four) hours as needed for moderate pain. 12 tablet 0  . Inositol Niacinate (NIACIN FLUSH FREE) 500 MG CAPS Take 500 mg by mouth 2 (two) times daily.     Marland Kitchen. latanoprost (  XALATAN) 0.005 % ophthalmic solution Place 1 drop into both eyes at bedtime.    . lisinopril (PRINIVIL,ZESTRIL) 20 MG tablet Take 20 mg by mouth 2 (two) times daily.     . ondansetron (ZOFRAN) 4 MG tablet Take 1 tablet (4 mg total) by mouth daily as needed for nausea or vomiting. 20 tablet 1  . oxybutynin (DITROPAN) 5 MG tablet Take 1 tablet (5 mg total) by mouth every 8 (eight) hours as needed for bladder spasms. 15 tablet 1  . oxyCODONE (OXY IR/ROXICODONE) 5 MG immediate release tablet Take 1 tablet (5 mg total) by mouth every 6 (six) hours as needed for moderate pain. 10 tablet 0  . potassium  citrate (UROCIT-K) 10 MEQ (1080 MG) SR tablet Take 1 tablet (10 mEq total) by mouth 3 (three) times daily with meals. 90 tablet 11  . sitaGLIPtin (JANUVIA) 100 MG tablet Take 100 mg by mouth daily.    . potassium chloride SA (K-DUR,KLOR-CON) 20 MEQ tablet Take 20-40 mEq by mouth See admin instructions. Alternate between 1 tablet (20 meq) & 2 tablets (40 meq) every other day    . tamsulosin (FLOMAX) 0.4 MG CAPS capsule Take 1 capsule (0.4 mg total) by mouth daily. (Patient not taking: Reported on 12/22/2018) 14 capsule 3   No current facility-administered medications on file prior to visit.     Allergies  Allergen Reactions  . Aspirin Other (See Comments)    Makes ears ring and heart beat fast  . Codeine Other (See Comments)    Hard to wake up    Social History   Substance and Sexual Activity  Alcohol Use No  . Alcohol/week: 0.0 standard drinks    Social History   Tobacco Use  Smoking Status Former Smoker  . Packs/day: 1.00  . Years: 30.00  . Pack years: 30.00  . Types: Cigarettes  . Quit date: 05/13/2004  . Years since quitting: 14.6  Smokeless Tobacco Never Used    Review of Systems  Constitutional: Negative.   HENT: Positive for sinus pain.   Eyes: Negative.   Respiratory: Positive for cough and shortness of breath.   Cardiovascular: Negative.   Gastrointestinal: Negative.   Genitourinary: Negative.   Musculoskeletal: Positive for back pain, joint pain and neck pain.  Skin: Negative.   Neurological: Negative.   Endo/Heme/Allergies: Negative.   Psychiatric/Behavioral: Negative.     Objective   Vitals:   12/22/18 1330  BP: 129/77  Pulse: 78  Resp: 16  Temp: 98.6 F (37 C)  SpO2: 97%    Physical Exam Vitals signs reviewed.  Constitutional:      Appearance: Normal appearance. She is not ill-appearing.  HENT:     Head: Normocephalic and atraumatic.  Cardiovascular:     Rate and Rhythm: Normal rate and regular rhythm.     Heart sounds: Normal heart  sounds. No murmur. No friction rub. No gallop.   Pulmonary:     Effort: Pulmonary effort is normal. No respiratory distress.     Breath sounds: Normal breath sounds. No stridor. No wheezing, rhonchi or rales.  Abdominal:     General: Abdomen is flat. There is no distension.     Palpations: Abdomen is soft. There is no mass.     Tenderness: There is no abdominal tenderness. There is no guarding or rebound.     Hernia: No hernia is present.  Skin:    General: Skin is warm and dry.  Neurological:     Mental Status: She is   alert and oriented to person, place, and time.    Primary care notes reviewed Assessment  Need for screening colonoscopy Plan   Patient is scheduled for a screening colonoscopy on 01/05/2019.  The risks and benefits of the procedure including bleeding and perforation were fully explained to the patient, who gave informed consent.  GoLYTELY prep has been prescribed.  

## 2018-12-22 NOTE — H&P (Signed)
Gwendolyn Snyder; [id number]; 12/20/1950   HPI Patient is a 68 year old white female who was referred to my care by Dr. Phillips OdorGolding for a screening colonoscopy.  Patient last had a screening colonoscopy over 10 years ago.  She denies abdominal pain, fevers, blood per rectum, weight change, abnormal diarrhea or constipation, or family history of colon carcinoma.  She currently has 0 out of 10 abdominal pain. Past Medical History:  Diagnosis Date  . Anemia   . Anxiety   . Arthritis   . Bilateral carpal tunnel syndrome    right hand- surgery , left hand has not and left hand goes to sleep   . Cataracts, bilateral   . Complication of anesthesia    once after surgery felt like she could not breathe- 11/09/15- surgery greater than 11 years ago  . COPD (chronic obstructive pulmonary disease) (HCC)   . Diabetes mellitus without complication (HCC)    diet controlled , type II   . Family history of adverse reaction to anesthesia    sister- confused  . Glaucoma   . History of hiatal hernia   . History of kidney stones   . Hypertension   . Shortness of breath dyspnea    with exertion    Past Surgical History:  Procedure Laterality Date  . ABDOMINAL HYSTERECTOMY    . CARPAL TUNNEL RELEASE Right   . COLONOSCOPY    . CYSTOSCOPY/RETROGRADE/URETEROSCOPY/STONE EXTRACTION WITH BASKET Left 09/17/2016   Procedure: CYSTOSCOPY/RETROGRADE/URETEROSCOPY/STONE EXTRACTION WITH BASKET/ STENT REMOVAL;  Surgeon: Bjorn PippinWrenn, [name], MD;  Location: [facility];  Service: Urology;  Laterality: Left;  . EXTRACORPOREAL SHOCK WAVE LITHOTRIPSY Left 11/16/2018   Procedure: EXTRACORPOREAL SHOCK WAVE LITHOTRIPSY (ESWL);  Surgeon: Crista ElliotBell, Eugene D III, MD;  Location: [facility];  Service: Urology;  Laterality: Left;  . IR URETERAL STENT LEFT NEW ACCESS W/O SEP NEPHROSTOMY CATH  08/20/2016  . JOINT REPLACEMENT    . NEPHROLITHOTOMY Left 08/20/2016   Procedure: LEFT NEPHROLITHOTOMY PERCUTANEOUS FIRST STAGE;  Surgeon: Bjorn PippinJohn [name], MD;  Location: [facility];   Service: Urology;  Laterality: Left;  . NEPHROLITHOTOMY Left 08/22/2016   Procedure: LEFT NEPHROLITHOTOMY PERCUTANEOUS SECOND LOOK;  Surgeon: Bjorn PippinJohn [name], MD;  Location: [facility];  Service: Urology;  Laterality: Left;  . Removal kidney stone    . TOTAL HIP ARTHROPLASTY Bilateral   . TOTAL HIP REVISION Left 11/22/2015   Procedure: Revision Left Total Hip Arthroplasty, Poly and Newman PiesBall Exchange;  Surgeon: Eldred MangesMark C Yates, MD;  [facility];  Service: Orthopedics;  Laterality: Left;  . TOTAL KNEE ARTHROPLASTY Right     History reviewed. No pertinent family history.  Current Outpatient Medications on File Prior to Visit  Medication Sig Dispense Refill  . acetaminophen (TYLENOL) 650 MG CR tablet Take 1,300 mg by mouth 2 (two) times daily.    Marland Kitchen. amLODipine (NORVASC) 5 MG tablet Take 5 mg by mouth daily.     . cholecalciferol (VITAMIN D) 1000 units tablet Take 1,000 Units by mouth daily.    Marland Kitchen. ezetimibe (ZETIA) 10 MG tablet Take 10 mg by mouth daily.    . ferrous sulfate 325 (65 FE) MG tablet Take 325 mg by mouth every Monday, Wednesday, and Friday. In the morning    . HYDROcodone-acetaminophen (NORCO) 5-325 MG tablet Take 1 tablet by mouth every 4 (four) hours as needed for moderate pain. 12 tablet 0  . Inositol Niacinate (NIACIN FLUSH FREE) 500 MG CAPS Take 500 mg by mouth 2 (two) times daily.     Marland Kitchen. latanoprost (  XALATAN) 0.005 % ophthalmic solution Place 1 drop into both eyes at bedtime.    Marland Kitchen. lisinopril (PRINIVIL,ZESTRIL) 20 MG tablet Take 20 mg by mouth 2 (two) times daily.     . ondansetron (ZOFRAN) 4 MG tablet Take 1 tablet (4 mg total) by mouth daily as needed for nausea or vomiting. 20 tablet 1  . oxybutynin (DITROPAN) 5 MG tablet Take 1 tablet (5 mg total) by mouth every 8 (eight) hours as needed for bladder spasms. 15 tablet 1  . oxyCODONE (OXY IR/ROXICODONE) 5 MG immediate release tablet Take 1 tablet (5 mg total) by mouth every 6 (six) hours as needed for moderate pain. 10 tablet 0  . potassium  citrate (UROCIT-K) 10 MEQ (1080 MG) SR tablet Take 1 tablet (10 mEq total) by mouth 3 (three) times daily with meals. 90 tablet 11  . sitaGLIPtin (JANUVIA) 100 MG tablet Take 100 mg by mouth daily.    . potassium chloride SA (K-DUR,KLOR-CON) 20 MEQ tablet Take 20-40 mEq by mouth See admin instructions. Alternate between 1 tablet (20 meq) & 2 tablets (40 meq) every other day    . tamsulosin (FLOMAX) 0.4 MG CAPS capsule Take 1 capsule (0.4 mg total) by mouth daily. (Patient not taking: Reported on 12/22/2018) 14 capsule 3   No current facility-administered medications on file prior to visit.     Allergies  Allergen Reactions  . Aspirin Other (See Comments)    Makes ears ring and heart beat fast  . Codeine Other (See Comments)    Hard to wake up    Social History   Substance and Sexual Activity  Alcohol Use No  . Alcohol/week: 0.0 standard drinks    Social History   Tobacco Use  Smoking Status Former Smoker  . Packs/day: 1.00  . Years: 30.00  . Pack years: 30.00  . Types: Cigarettes  . Quit date: 05/13/2004  . Years since quitting: 14.6  Smokeless Tobacco Never Used    Review of Systems  Constitutional: Negative.   HENT: Positive for sinus pain.   Eyes: Negative.   Respiratory: Positive for cough and shortness of breath.   Cardiovascular: Negative.   Gastrointestinal: Negative.   Genitourinary: Negative.   Musculoskeletal: Positive for back pain, joint pain and neck pain.  Skin: Negative.   Neurological: Negative.   Endo/Heme/Allergies: Negative.   Psychiatric/Behavioral: Negative.     Objective   Vitals:   12/22/18 1330  BP: 129/77  Pulse: 78  Resp: 16  Temp: 98.6 F (37 C)  SpO2: 97%    Physical Exam Vitals signs reviewed.  Constitutional:      Appearance: Normal appearance. She is not ill-appearing.  HENT:     Head: Normocephalic and atraumatic.  Cardiovascular:     Rate and Rhythm: Normal rate and regular rhythm.     Heart sounds: Normal heart  sounds. No murmur. No friction rub. No gallop.   Pulmonary:     Effort: Pulmonary effort is normal. No respiratory distress.     Breath sounds: Normal breath sounds. No stridor. No wheezing, rhonchi or rales.  Abdominal:     General: Abdomen is flat. There is no distension.     Palpations: Abdomen is soft. There is no mass.     Tenderness: There is no abdominal tenderness. There is no guarding or rebound.     Hernia: No hernia is present.  Skin:    General: Skin is warm and dry.  Neurological:     Mental Status: She is  alert and oriented to person, place, and time.    Primary care notes reviewed Assessment  Need for screening colonoscopy Plan   Patient is scheduled for a screening colonoscopy on 01/05/2019.  The risks and benefits of the procedure including bleeding and perforation were fully explained to the patient, who gave informed consent.  GoLYTELY prep has been prescribed.

## 2018-12-22 NOTE — Patient Instructions (Signed)
Colonoscopy, Adult A colonoscopy is an exam to look at the entire large intestine. During the exam, a lubricated, flexible tube that has a camera on the end of it is inserted into the anus and then passed into the rectum, colon, and other parts of the large intestine. You may have a colonoscopy as a part of normal colorectal screening or if you have certain symptoms, such as:  Lack of red blood cells (anemia).  Diarrhea that does not go away.  Abdominal pain.  Blood in your stool (feces). A colonoscopy can help screen for and diagnose medical problems, including:  Tumors.  Polyps.  Inflammation.  Areas of bleeding. Tell a health care provider about:  Any allergies you have.  All medicines you are taking, including vitamins, herbs, eye drops, creams, and over-the-counter medicines.  Any problems you or family members have had with anesthetic medicines.  Any blood disorders you have.  Any surgeries you have had.  Any medical conditions you have.  Any problems you have had passing stool. What are the risks? Generally, this is a safe procedure. However, problems may occur, including:  Bleeding.  A tear in the intestine.  A reaction to medicines given during the exam.  Infection (rare). What happens before the procedure? Eating and drinking restrictions Follow instructions from your health care provider about eating and drinking, which may include:  A few days before the procedure - follow a low-fiber diet. Avoid nuts, seeds, dried fruit, raw fruits, and vegetables.  1-3 days before the procedure - follow a clear liquid diet. Drink only clear liquids, such as clear broth or bouillon, black coffee or tea, clear juice, clear soft drinks or sports drinks, gelatin dessert, and popsicles. Avoid any liquids that contain red or purple dye.  On the day of the procedure - do not eat or drink anything starting 2 hours before the procedure, or within the time period that your  health care provider recommends. Up to 2 hours before the procedure, you may continue to drink clear liquids, such as water or clear fruit juice. Bowel prep If you were prescribed an oral bowel prep to clean out your colon:  Take it as told by your health care provider. Starting the day before your procedure, you will need to drink a large amount of medicated liquid. The liquid will cause you to have multiple loose stools until your stool is almost clear or light green.  If your skin or anus gets irritated from diarrhea, you may use these to relieve the irritation: ? Medicated wipes, such as adult wet wipes with aloe and vitamin E. ? A skin-soothing product like petroleum jelly.  If you vomit while drinking the bowel prep, take a break for up to 60 minutes and then begin the bowel prep again. If vomiting continues and you cannot take the bowel prep without vomiting, call your health care provider.  To clean out your colon, you may also be given: ? Laxative medicines. ? Instructions about how to use an enema. General instructions  Ask your health care provider about: ? Changing or stopping your regular medicines or supplements. This is especially important if you are taking iron supplements, diabetes medicines, or blood thinners. ? Taking medicines such as aspirin and ibuprofen. These medicines can thin your blood. Do not take these medicines before the procedure if your health care provider tells you not to.  Plan to have someone take you home from the hospital or clinic. What happens during the procedure?     An IV may be inserted into one of your veins.  You will be given medicine to help you relax (sedative).  To reduce your risk of infection: ? Your health care team will wash or sanitize their hands. ? Your anal area will be washed with soap.  You will be asked to lie on your side with your knees bent.  Your health care provider will lubricate a long, thin, flexible tube. The  tube will have a camera and a light on the end.  The tube will be inserted into your anus.  The tube will be gently eased through your rectum and colon.  Air will be delivered into your colon to keep it open. You may feel some pressure or cramping.  The camera will be used to take images during the procedure.  A small tissue sample may be removed to be examined under a microscope (biopsy).  If small polyps are found, your health care provider may remove them and have them checked for cancer cells.  When the exam is done, the tube will be removed. The procedure may vary among health care providers and hospitals. What happens after the procedure?  Your blood pressure, heart rate, breathing rate, and blood oxygen level will be monitored until the medicines you were given have worn off.  Do not drive for 24 hours after the exam.  You may have a small amount of blood in your stool.  You may pass gas and have mild abdominal cramping or bloating due to the air that was used to inflate your colon during the exam.  It is up to you to get the results of your procedure. Ask your health care provider, or the department performing the procedure, when your results will be ready. Summary  A colonoscopy is an exam to look at the entire large intestine.  During a colonoscopy, a lubricated, flexible tube with a camera on the end of it is inserted into the anus and then passed into the colon and other parts of the large intestine.  Follow instructions from your health care provider about eating and drinking before the procedure.  If you were prescribed an oral bowel prep to clean out your colon, take it as told by your health care provider.  After your procedure, your blood pressure, heart rate, breathing rate, and blood oxygen level will be monitored until the medicines you were given have worn off. This information is not intended to replace advice given to you by your health care provider. Make  sure you discuss any questions you have with your health care provider. Document Released: 04/26/2000 Document Revised: 02/19/2017 Document Reviewed: 07/11/2015 Elsevier Patient Education  2020 [employer]  

## 2018-12-31 ENCOUNTER — Other Ambulatory Visit: Payer: Self-pay

## 2018-12-31 ENCOUNTER — Telehealth: Payer: Self-pay

## 2018-12-31 NOTE — Telephone Encounter (Signed)
Patient called regarding upcoming colonoscopy. Patient states that she needs to pick up prep but can not find prescription. MD notified.

## 2018-12-31 NOTE — Telephone Encounter (Signed)
Spoke with [employer], prescription is filled and ready for pick up.

## 2019-01-01 ENCOUNTER — Other Ambulatory Visit: Payer: Self-pay

## 2019-01-01 ENCOUNTER — Other Ambulatory Visit (HOSPITAL_COMMUNITY)
Admission: RE | Admit: 2019-01-01 | Discharge: 2019-01-01 | Disposition: A | Discharge status: Home / Self Care | Payer: PPO | Source: Ambulatory Visit | Attending: General Surgery | Admitting: General Surgery

## 2019-01-01 DIAGNOSIS — Z20828 Contact with and (suspected) exposure to other viral communicable diseases: Secondary | ICD-10-CM | POA: Insufficient documentation

## 2019-01-01 DIAGNOSIS — Z01812 Encounter for preprocedural laboratory examination: Secondary | ICD-10-CM | POA: Diagnosis not present

## 2019-01-01 LAB — SARS CORONAVIRUS 2 (TAT 6-24 HRS): SARS Coronavirus 2: NEGATIVE

## 2019-01-01 MED ORDER — PEG 3350-KCL-NA BICARB-NACL 420 G PO SOLR: 4000.0000 mL | Freq: Once | ORAL | 0 refills | Status: AC

## 2019-01-01 NOTE — Addendum Note (Signed)
Addended by: Aviva Signs A on: 01/01/2019 08:14 AM   Modules accepted: Orders

## 2019-01-05 ENCOUNTER — Ambulatory Visit (HOSPITAL_COMMUNITY)
Admission: RE | Admit: 2019-01-05 | Discharge: 2019-01-05 | Disposition: A | Discharge status: Home / Self Care | Payer: PPO | Attending: General Surgery | Admitting: General Surgery

## 2019-01-05 ENCOUNTER — Encounter (HOSPITAL_COMMUNITY)
Admission: RE | Disposition: A | Discharge status: Home / Self Care | Payer: Self-pay | Source: Home / Self Care | Attending: General Surgery

## 2019-01-05 ENCOUNTER — Encounter (HOSPITAL_COMMUNITY): Payer: Self-pay | Admitting: *Deleted

## 2019-01-05 ENCOUNTER — Other Ambulatory Visit: Payer: Self-pay

## 2019-01-05 DIAGNOSIS — E119 Type 2 diabetes mellitus without complications: Secondary | ICD-10-CM | POA: Diagnosis not present

## 2019-01-05 DIAGNOSIS — F419 Anxiety disorder, unspecified: Secondary | ICD-10-CM | POA: Diagnosis not present

## 2019-01-05 DIAGNOSIS — Z96651 Presence of right artificial knee joint: Secondary | ICD-10-CM | POA: Insufficient documentation

## 2019-01-05 DIAGNOSIS — M1990 Unspecified osteoarthritis, unspecified site: Secondary | ICD-10-CM | POA: Diagnosis not present

## 2019-01-05 DIAGNOSIS — Z79899 Other long term (current) drug therapy: Secondary | ICD-10-CM | POA: Diagnosis not present

## 2019-01-05 DIAGNOSIS — I10 Essential (primary) hypertension: Secondary | ICD-10-CM | POA: Insufficient documentation

## 2019-01-05 DIAGNOSIS — Z1211 Encounter for screening for malignant neoplasm of colon: Secondary | ICD-10-CM | POA: Diagnosis not present

## 2019-01-05 DIAGNOSIS — Z96643 Presence of artificial hip joint, bilateral: Secondary | ICD-10-CM | POA: Insufficient documentation

## 2019-01-05 DIAGNOSIS — H409 Unspecified glaucoma: Secondary | ICD-10-CM | POA: Diagnosis not present

## 2019-01-05 DIAGNOSIS — Z7984 Long term (current) use of oral hypoglycemic drugs: Secondary | ICD-10-CM | POA: Insufficient documentation

## 2019-01-05 DIAGNOSIS — J449 Chronic obstructive pulmonary disease, unspecified: Secondary | ICD-10-CM | POA: Insufficient documentation

## 2019-01-05 HISTORY — PX: COLONOSCOPY: SHX5424

## 2019-01-05 LAB — GLUCOSE, CAPILLARY: Glucose-Capillary: 113 mg/dL — ABNORMAL HIGH (ref 70–99)

## 2019-01-05 SURGERY — COLONOSCOPY
Anesthesia: Moderate Sedation

## 2019-01-05 MED ORDER — MIDAZOLAM HCL 5 MG/5ML IJ SOLN
INTRAMUSCULAR | Status: DC | PRN
  Administered 2019-01-05: 1 mg via INTRAVENOUS
  Administered 2019-01-05: 3 mg via INTRAVENOUS

## 2019-01-05 MED ORDER — SODIUM CHLORIDE 0.9 % IV SOLN
INTRAVENOUS | Status: DC
  Administered 2019-01-05: 07:00:00 via INTRAVENOUS

## 2019-01-05 MED ORDER — MEPERIDINE HCL 50 MG/ML IJ SOLN
INTRAMUSCULAR | Status: DC | PRN
  Administered 2019-01-05: 50 mg via INTRAVENOUS

## 2019-01-05 MED ORDER — MEPERIDINE HCL 50 MG/ML IJ SOLN
INTRAMUSCULAR | Status: AC
  Filled 2019-01-05: qty 1

## 2019-01-05 MED ORDER — MIDAZOLAM HCL 5 MG/5ML IJ SOLN
INTRAMUSCULAR | Status: AC
  Filled 2019-01-05: qty 10

## 2019-01-05 NOTE — Op Note (Signed)
[facility] Patient Name: Gwendolyn BudgeWanda Snyder Procedure Date: 01/05/2019 7:07 AM MRN: [id number] Date of Birth: 11/07/1950 Attending MD: Franky MachoMark Aashika Carta , MD CSN: [id number] Age: [age] Admit Type: Outpatient Procedure:                Colonoscopy Indications:              Screening for colorectal malignant neoplasm Providers:                Franky MachoMark [place] Rosamond, MD, Jannett CelestineAnitra Bell, RN, Dyann Ruddleonya Wilson Referring MD:              Medicines:                Midazolam 4 mg IV, Meperidine 50 mg IV Complications:            No immediate complications. Estimated Blood Loss:     Estimated blood loss: none. Procedure:                Pre-Anesthesia Assessment:                           - Prior to the procedure, a History and Physical                            was performed, and patient medications and                            allergies were reviewed. The patient is competent.                            The risks and benefits of the procedure and the                            sedation options and risks were discussed with the                            patient. All questions were answered and informed                            consent was obtained. Patient identification and                            proposed procedure were verified by the physician,                            the nurse and the technician in the procedure room.                            Mental Status Examination: alert and oriented.                            Airway Examination: normal oropharyngeal airway and                            neck mobility. Respiratory Examination: clear to  auscultation. CV Examination: RRR, no murmurs, no                            S3 or S4. Prophylactic Antibiotics: The patient                            does not require prophylactic antibiotics. Prior                            Anticoagulants: The patient has taken no previous                            anticoagulant or antiplatelet  agents. ASA Grade                            Assessment: II - A patient with mild systemic                            disease. After reviewing the risks and benefits,                            the patient was deemed in satisfactory condition to                            undergo the procedure. The anesthesia plan was to                            use moderate sedation / analgesia (conscious                            sedation). Immediately prior to administration of                            medications, the patient was re-assessed for                            adequacy to receive sedatives. The heart rate,                            respiratory rate, oxygen saturations, blood                            pressure, adequacy of pulmonary ventilation, and                            response to care were monitored throughout the                            procedure. The physical status of the patient was                            re-assessed after the procedure.  After obtaining informed consent, the colonoscope                            was passed under direct vision. Throughout the                            procedure, the patient's blood pressure, pulse, and                            oxygen saturations were monitored continuously. The                            CF-[id number] ([id number] scope was introduced through                            the anus and advanced to the the cecum, identified                            by the appendiceal orifice, ileocecal valve and                            palpation. No anatomical landmarks were                            photographed. The entire colon was well visualized.                            The colonoscopy was performed without difficulty.                            The patient tolerated the procedure well. The                            quality of the bowel preparation was adequate. The                            total  duration of the procedure was 14 minutes. Scope In: 7:25:37 AM Scope Out: 7:38:43 AM Scope Withdrawal Time: 0 hours 3 minutes 37 seconds  Total Procedure Duration: 0 hours 13 minutes 6 seconds  Findings:      The entire examined colon appeared normal on direct and retroflexion       views. Impression:               - The entire examined colon is normal on direct and                            retroflexion views.                           - No specimens collected. Moderate Sedation:      Moderate (conscious) sedation was administered by the endoscopy nurse       and supervised by the endoscopist. The following parameters were       monitored: oxygen saturation, heart rate, blood pressure, and response  to care. Recommendation:           - Written discharge instructions were provided to                            the patient.                           - The signs and symptoms of potential delayed                            complications were discussed with the patient.                           - Patient has a contact number available for                            emergencies.                           - Return to normal activities tomorrow.                           - Resume previous diet.                           - Continue present medications.                           - Repeat colonoscopy in 10 years for screening                            purposes. Procedure Code(s):        --- Professional ---                           W4132, Colorectal cancer screening; colonoscopy on                            individual not meeting criteria for high risk Diagnosis Code(s):        --- Professional ---                           Z12.11, Encounter for screening for malignant                            neoplasm of colon CPT copyright 2019 American [employer] Association. All rights reserved. The codes documented in this report are preliminary and upon coder review may  be revised to meet  current compliance requirements. Aviva Signs, MD Aviva Signs, MD 01/05/2019 7:43:53 AM This report has been signed electronically. Number of Addenda: 0

## 2019-01-05 NOTE — Discharge Instructions (Signed)
Colonoscopy, Adult, Care After This sheet gives you information about how to care for yourself after your procedure. Your health care provider may also give you more specific instructions. If you have problems or questions, contact your health care provider. What can I expect after the procedure? After the procedure, it is common to have:  A small amount of blood in your stool for 24 hours after the procedure.  Some gas.  Mild abdominal cramping or bloating. Follow these instructions at home: General instructions  For the first 24 hours after the procedure: ? Do not drive or use machinery. ? Do not sign important documents. ? Do not drink alcohol. ? Do your regular daily activities at a slower pace than normal. ? Eat soft, easy-to-digest foods.  Take over-the-counter or prescription medicines only as told by your health care provider. Relieving cramping and bloating   Try walking around when you have cramps or feel bloated.  Apply heat to your abdomen as told by your health care provider. Use a heat source that your health care provider recommends, such as a moist heat pack or a heating pad. ? Place a towel between your skin and the heat source. ? Leave the heat on for 20-30 minutes. ? Remove the heat if your skin turns bright red. This is especially important if you are unable to feel pain, heat, or cold. You may have a greater risk of getting burned. Eating and drinking   Drink enough fluid to keep your urine pale yellow.  Resume your normal diet as instructed by your health care provider. Avoid heavy or fried foods that are hard to digest.  Avoid drinking alcohol for as long as instructed by your health care provider. Contact a health care provider if:  You have blood in your stool 2-3 days after the procedure. Get help right away if:  You have more than a small spotting of blood in your stool.  You pass large blood clots in your stool.  Your abdomen is  swollen.  You have nausea or vomiting.  You have a fever.  You have increasing abdominal pain that is not relieved with medicine. Summary  After the procedure, it is common to have a small amount of blood in your stool. You may also have mild abdominal cramping and bloating.  For the first 24 hours after the procedure, do not drive or use machinery, sign important documents, or drink alcohol.  Contact your health care provider if you have a lot of blood in your stool, nausea or vomiting, a fever, or increased abdominal pain. This information is not intended to replace advice given to you by your health care provider. Make sure you discuss any questions you have with your health care provider. Document Released: 12/12/2003 Document Revised: 02/19/2017 Document Reviewed: 07/11/2015 Elsevier Patient Education  2020 [employer]  PATIENT INSTRUCTIONS POST-ANESTHESIA  IMMEDIATELY FOLLOWING SURGERY:  Do not drive or operate machinery for the first twenty four hours after surgery.  Do not make any important decisions for twenty four hours after surgery or while taking narcotic pain medications or sedatives.  If you develop intractable nausea and vomiting or a severe headache please notify your doctor immediately.  FOLLOW-UP:  Please make an appointment with your surgeon as instructed. You do not need to follow up with anesthesia unless specifically instructed to do so.  WOUND CARE INSTRUCTIONS (if applicable):  Keep a dry clean dressing on the anesthesia/puncture wound site if there is drainage.  Once the wound  has quit draining you may leave it open to air.  Generally you should leave the bandage intact for twenty four hours unless there is drainage.  If the epidural site drains for more than 36-48 hours please call the anesthesia department.  QUESTIONS?:  Please feel free to call your physician or the hospital operator if you have any questions, and they will be happy to assist you.       Colonoscopy, Adult, Care After This sheet gives you information about how to care for yourself after your procedure. Your doctor may also give you more specific instructions. If you have problems or questions, call your doctor. What can I expect after the procedure? After the procedure, it is common to have:  A small amount of blood in your poop for 24 hours.  Some gas.  Mild cramping or bloating in your belly. Follow these instructions at home: General instructions  For the first 24 hours after the procedure: ? Do not drive or use machinery. ? Do not sign important documents. ? Do not drink alcohol. ? Do your daily activities more slowly than normal. ? Eat foods that are soft and easy to digest.  Take over-the-counter or prescription medicines only as told by your doctor. To help cramping and bloating:   Try walking around.  Put heat on your belly (abdomen) as told by your doctor. Use a heat source that your doctor recommends, such as a moist heat pack or a heating pad. ? Put a towel between your skin and the heat source. ? Leave the heat on for 20-30 minutes. ? Remove the heat if your skin turns bright red. This is especially important if you cannot feel pain, heat, or cold. You can get burned. Eating and drinking   Drink enough fluid to keep your pee (urine) clear or pale yellow.  Return to your normal diet as told by your doctor. Avoid heavy or fried foods that are hard to digest.  Avoid drinking alcohol for as long as told by your doctor. Contact a doctor if:  You have blood in your poop (stool) 2-3 days after the procedure. Get help right away if:  You have more than a small amount of blood in your poop.  You see large clumps of tissue (blood clots) in your poop.  Your belly is swollen.  You feel sick to your stomach (nauseous).  You throw up (vomit).  You have a fever.  You have belly pain that gets worse, and medicine does not help your  pain. Summary  After the procedure, it is common to have a small amount of blood in your poop. You may also have mild cramping and bloating in your belly.  For the first 24 hours after the procedure, do not drive or use machinery, do not sign important documents, and do not drink alcohol.  Get help right away if you have a lot of blood in your poop, feel sick to your stomach, have a fever, or have more belly pain. This information is not intended to replace advice given to you by your health care provider. Make sure you discuss any questions you have with your health care provider. Document Released: 06/01/2010 Document Revised: 02/27/2017 Document Reviewed: 01/22/2016 Elsevier Patient Education  2020 [employer].

## 2019-01-05 NOTE — Interval H&P Note (Signed)
History and Physical Interval Note:  01/05/2019 7:14 AM  Gwendolyn Snyder  has presented today for surgery, with the diagnosis of screening.  The various methods of treatment have been discussed with the patient and family. After consideration of risks, benefits and other options for treatment, the patient has consented to  Procedure(s): COLONOSCOPY (N/A) as a surgical intervention.  The patient's history has been reviewed, patient examined, no change in status, stable for surgery.  I have reviewed the patient's chart and labs.  Questions were answered to the patient's satisfaction.     Aviva Signs

## 2019-01-08 ENCOUNTER — Ambulatory Visit (INDEPENDENT_AMBULATORY_CARE_PROVIDER_SITE_OTHER): Payer: PPO | Admitting: Urology

## 2019-01-08 ENCOUNTER — Encounter (HOSPITAL_COMMUNITY): Payer: Self-pay | Admitting: General Surgery

## 2019-01-08 DIAGNOSIS — N200 Calculus of kidney: Secondary | ICD-10-CM

## 2019-01-12 DIAGNOSIS — Z23 Encounter for immunization: Secondary | ICD-10-CM | POA: Diagnosis not present

## 2019-01-12 DIAGNOSIS — D510 Vitamin B12 deficiency anemia due to intrinsic factor deficiency: Secondary | ICD-10-CM | POA: Diagnosis not present

## 2019-01-13 ENCOUNTER — Encounter (HOSPITAL_COMMUNITY): Payer: Self-pay | Admitting: General Practice

## 2019-01-13 DIAGNOSIS — H401131 Primary open-angle glaucoma, bilateral, mild stage: Secondary | ICD-10-CM | POA: Diagnosis not present

## 2019-01-19 ENCOUNTER — Other Ambulatory Visit (HOSPITAL_COMMUNITY)
Admission: RE | Admit: 2019-01-19 | Discharge: 2019-01-19 | Disposition: A | Discharge status: Home / Self Care | Payer: PPO | Source: Ambulatory Visit | Attending: Nurse Practitioner | Admitting: Nurse Practitioner

## 2019-01-19 DIAGNOSIS — N200 Calculus of kidney: Secondary | ICD-10-CM | POA: Insufficient documentation

## 2019-01-20 LAB — URINE CULTURE: Culture: <10000 — AB

## 2019-01-20 NOTE — H&P (Signed)
CC: I have a history of kidney stones.  HPI: Gwendolyn Snyder is a 68 year-old female established patient who is here for F/U due to a history of renal calculi.  She did not pass a stone since the last office visit. The patient has been taking Allopurinol & Potassium Citrate for their calculus disease.   The patient has not had any flank pain since they were last seen. The patient denies any progressive voiding symptoms. She has no seen blood in her urine since the last visit. She is not currently having flank pain, back pain, groin pain, nausea, vomiting, fever or chills. She has had Ureteral Stent, Ureteroscopy, and Percutaneous Nephrolithotomy for treatment of her stones in the past.   Gwendolyn Snyder returns today for a preop visit for left renal stones that will need a second ESWL for the lower pole component. Her UA is clear and she has no pain. She had the initial procedure on 11/16/18 and she passed several small fragments the day after and none since. She has no pain or hematuria. She was given tamsulosin and had some side effects with listlessness which improved after stopping it. A KUB at her visit in late [place] showed fragmentation of the upper pole stone but no real change in the lower pole stones. The entire stone burden wasn't treated because of the volume.   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 She remains on allopurinol and potassium citrate. Her stones were 80% UA and 20% CaOx.     ALLERGIES: Aspirin - Other Reaction, "makes ears ring and heart beat fast" Codeine - Other Reaction, "hard to wake up"    MEDICATIONS: Allopurinol 300 mg tablet 1 tablet PO Daily  Lisinopril 20 mg tablet  Amlodipine Besylate 5 mg tablet  Ezetimibe 10 mg tablet  Ferrous Sulfate 325 mg (65 mg iron) tablet  Januvia 100 mg tablet  Niacin Inositol  Potassium Citrate Er 15 meq (1,620 mg) tablet, extended release 2 tablet PO BID  Potassium Citrate Er 15 meq (1,620 mg) tablet, extended  release  Tylenol Arthritis 650 mg tablet, extended release  Vitamin B12 Injection  Vitamin D3 25 mcg (1,000 unit) tablet     GU PSH: Compl Pyelo Lithotomy ESWL, Left - 11/16/2018 Hysterectomy Locm 300-399Mg /Ml Iodine,1Ml - 2018 Percut Stone Removal >2cm - 2018 Stone Removal Nephrost Tube, Left - 2018 Ureteroscopic stone removal, Right - 2018     NON-GU PSH: Carpal tunnel surgery, Right Cataract surgery, Bilateral Hip Replacement, Bilateral Knee replacement, Right     GU PMH: Renal calculus, She will need at least one more ESWL for the left renal stones. - 12/11/2018, She has had no flank pain or gross hematuria but has Heme and LE on the dip UA today. I will check a UA micro and culture. I will also get a renal US and KUB. 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 There is no obstruction. I am going to get her set up for left PCNL and will request upper and lower pole access. She will likely need 2-3 procedures to be rendered stone free. I have reviewed the risks of bleeding, infection, injury to the kidney or adjacent organs, urine leaks, need for multiple procedures, thrombotic events and anesthetic complications. Urine culture will be sent today. 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 BMP today with renal US as noted above. 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 She required a morphine injection post CT and will be sent home with oxycodone. - 2018    NON-GU PMH: Hyperuricemia, I have refilled the allopurinol and will check a CBC and Uric acid level. We will consider a dose reduction based on the results. - 07/10/2018 Anxiety Arthritis COPD Diabetes Type 2 Glaucoma Gout Hypercholesterolemia Hypertension     FAMILY HISTORY: Cancer - Father Death of family member - Mother, Father Diabetes - Mother, Sister Glaucoma - Brother, Father Heart Attack - Mother Heart Disease - Runs in Family Kidney Stones - Father, Brother   SOCIAL HISTORY: Marital Status: Single Preferred Language: English; Ethnicity: Not Hispanic Or Latino; Race: White Current Smoking Status: Patient does not smoke anymore. Has not smoked since 07/11/2004. Smoked for 30 years. Smoked 1 pack per day.   Tobacco Use Assessment Completed: Used Tobacco in last 30 days? Has never drank.  Does not drink caffeine. Patient's occupation is/was retired.    REVIEW OF SYSTEMS:    GU Review Female:   Patient denies frequent urination, hard to postpone urination, burning /pain with urination, get up at night to urinate, leakage of urine, stream starts and stops, trouble starting your stream, have to strain to urinate, and being pregnant.  Gastrointestinal (Upper):   Patient denies nausea, vomiting, and indigestion/ heartburn.  Gastrointestinal (Lower):   Patient denies diarrhea and constipation.  Constitutional:   Patient denies fever, night sweats, weight loss, and fatigue.  Skin:   Patient denies skin rash/ lesion and itching.  Eyes:   Patient denies blurred vision and double vision.  Ears/ Nose/ Throat:   Patient denies sore throat and sinus problems.  Hematologic/Lymphatic:   Patient denies swollen glands and easy bruising.  Cardiovascular:   Patient denies leg swelling and chest pains.  Respiratory:   Patient denies cough and shortness of breath.  Endocrine:   Patient denies excessive thirst.  Musculoskeletal:   Patient denies back pain and joint pain.  Neurological:   Patient denies headaches and dizziness.  Psychologic:   Patient denies depression and anxiety.   VITAL SIGNS:      01/08/2019 03:20 PM  Weight 190 lb / 86.18 kg  Height 62 in / 157.48 cm  BP 134/84 mmHg  Pulse 91 /min  Temperature 98.7 F / 37.0 C  BMI 34.7 kg/m    MULTI-SYSTEM PHYSICAL EXAMINATION:    Constitutional: Well-nourished. No physical deformities. Normally developed. Good grooming.  Respiratory: Normal breath sounds. No labored breathing, no use of accessory muscles.   Cardiovascular: Regular rate and rhythm. No murmur, no gallop.      PAST DATA REVIEWED:  Source Of History:  Patient  Urine Test Review:   Urinalysis   PROCEDURES:          Urinalysis - 81003 Dipstick Dipstick Cont'd  Specimen: Voided Bilirubin: Neg  Color: Yellow Ketones: Neg  Appearance: Clear Blood: Neg  Specific Gravity: 1.025 Protein: Neg  pH: 5.0 Urobilinogen: 0.2  Glucose: Neg Nitrites: Neg    Leukocyte Esterase: Neg    ASSESSMENT:      ICD-10 Details  1 GU:   Renal calculus - N20.0 She will proceed with second stage ESWL on 9/10.    PLAN:  Schedule Return Visit/Planned Activity: Keep Scheduled Appointment          Document Letter(s):  Created for Patient: Clinical Summary         Notes:   CC: Dr. Assunta FoundJohn Golding.         Next Appointment:      Next Appointment: 01/21/2019 07:30 AM    Appointment Type: Surgery     Location: [facility] [phone]    Provider: Bjorn PippinJohn Tristram Milian, M.D.    Reason for Visit: OP WL LT ESWL

## 2019-01-21 ENCOUNTER — Encounter (HOSPITAL_COMMUNITY): Payer: Self-pay | Admitting: *Deleted

## 2019-01-21 ENCOUNTER — Encounter (HOSPITAL_COMMUNITY)
Admission: RE | Disposition: A | Discharge status: Home / Self Care | Payer: Self-pay | Source: Home / Self Care | Attending: Urology

## 2019-01-21 ENCOUNTER — Ambulatory Visit (HOSPITAL_COMMUNITY)
Admission: RE | Admit: 2019-01-21 | Discharge: 2019-01-21 | Disposition: A | Discharge status: Home / Self Care | Payer: PPO | Attending: Urology | Admitting: Urology

## 2019-01-21 ENCOUNTER — Other Ambulatory Visit: Payer: Self-pay

## 2019-01-21 ENCOUNTER — Ambulatory Visit (HOSPITAL_COMMUNITY): Payer: PPO

## 2019-01-21 DIAGNOSIS — N200 Calculus of kidney: Secondary | ICD-10-CM | POA: Diagnosis not present

## 2019-01-21 DIAGNOSIS — Z01818 Encounter for other preprocedural examination: Secondary | ICD-10-CM | POA: Diagnosis not present

## 2019-01-21 DIAGNOSIS — I10 Essential (primary) hypertension: Secondary | ICD-10-CM | POA: Insufficient documentation

## 2019-01-21 DIAGNOSIS — E669 Obesity, unspecified: Secondary | ICD-10-CM | POA: Diagnosis not present

## 2019-01-21 DIAGNOSIS — E119 Type 2 diabetes mellitus without complications: Secondary | ICD-10-CM | POA: Diagnosis not present

## 2019-01-21 HISTORY — PX: EXTRACORPOREAL SHOCK WAVE LITHOTRIPSY: SHX1557

## 2019-01-21 LAB — GLUCOSE, CAPILLARY: Glucose-Capillary: 117 mg/dL — ABNORMAL HIGH (ref 70–99)

## 2019-01-21 SURGERY — LITHOTRIPSY, ESWL
Anesthesia: LOCAL | Laterality: Left

## 2019-01-21 MED ORDER — SODIUM CHLORIDE 0.9 % IV SOLN
INTRAVENOUS | Status: DC
  Administered 2019-01-21: 07:00:00 via INTRAVENOUS

## 2019-01-21 MED ORDER — DIAZEPAM 5 MG PO TABS
10.0000 mg | ORAL_TABLET | ORAL | Status: AC
  Administered 2019-01-21: 10 mg via ORAL
  Filled 2019-01-21: qty 2

## 2019-01-21 MED ORDER — DIPHENHYDRAMINE HCL 25 MG PO CAPS
25.0000 mg | ORAL_CAPSULE | ORAL | Status: AC
  Administered 2019-01-21: 25 mg via ORAL
  Filled 2019-01-21: qty 1

## 2019-01-21 MED ORDER — CIPROFLOXACIN HCL 500 MG PO TABS
500.0000 mg | ORAL_TABLET | ORAL | Status: AC
  Administered 2019-01-21: 500 mg via ORAL
  Filled 2019-01-21: qty 1

## 2019-01-21 NOTE — Interval H&P Note (Signed)
History and Physical Interval Note: Urine culture on 01/19/19 was negative.   01/21/2019 7:37 AM  Gwendolyn Snyder  has presented today for surgery, with the diagnosis of LEFT RENAL STONE.  The various methods of treatment have been discussed with the patient and family. After consideration of risks, benefits and other options for treatment, the patient has consented to  Procedure(s): EXTRACORPOREAL SHOCK WAVE LITHOTRIPSY (ESWL) (Left) as a surgical intervention.  The patient's history has been reviewed, patient examined, no change in status, stable for surgery.  I have reviewed the patient's chart and labs.  Questions were answered to the patient's satisfaction.     Irine Seal

## 2019-01-21 NOTE — Discharge Instructions (Addendum)

## 2019-01-22 ENCOUNTER — Encounter (HOSPITAL_COMMUNITY): Payer: Self-pay | Admitting: Urology

## 2019-02-08 ENCOUNTER — Other Ambulatory Visit: Payer: Self-pay

## 2019-02-08 ENCOUNTER — Other Ambulatory Visit (HOSPITAL_COMMUNITY): Payer: Self-pay | Admitting: Nurse Practitioner

## 2019-02-08 ENCOUNTER — Ambulatory Visit (HOSPITAL_COMMUNITY)
Admission: RE | Admit: 2019-02-08 | Discharge: 2019-02-08 | Disposition: A | Discharge status: Home / Self Care | Payer: PPO | Source: Ambulatory Visit | Attending: Nurse Practitioner | Admitting: Nurse Practitioner

## 2019-02-08 DIAGNOSIS — N200 Calculus of kidney: Secondary | ICD-10-CM | POA: Insufficient documentation

## 2019-02-11 DIAGNOSIS — D510 Vitamin B12 deficiency anemia due to intrinsic factor deficiency: Secondary | ICD-10-CM | POA: Diagnosis not present

## 2019-02-12 ENCOUNTER — Ambulatory Visit: Payer: PPO | Admitting: Urology

## 2019-02-12 ENCOUNTER — Other Ambulatory Visit: Payer: Self-pay

## 2019-02-12 DIAGNOSIS — N200 Calculus of kidney: Secondary | ICD-10-CM | POA: Diagnosis not present

## 2019-02-15 ENCOUNTER — Other Ambulatory Visit (HOSPITAL_COMMUNITY): Payer: Self-pay | Admitting: Urology

## 2019-02-15 ENCOUNTER — Other Ambulatory Visit: Payer: Self-pay | Admitting: Urology

## 2019-02-15 ENCOUNTER — Other Ambulatory Visit (HOSPITAL_COMMUNITY): Payer: Self-pay | Admitting: Family Medicine

## 2019-02-15 DIAGNOSIS — Z1231 Encounter for screening mammogram for malignant neoplasm of breast: Secondary | ICD-10-CM

## 2019-02-15 DIAGNOSIS — N200 Calculus of kidney: Secondary | ICD-10-CM

## 2019-02-25 ENCOUNTER — Ambulatory Visit (HOSPITAL_COMMUNITY): Payer: PPO

## 2019-02-26 ENCOUNTER — Ambulatory Visit (HOSPITAL_COMMUNITY)
Admission: RE | Admit: 2019-02-26 | Discharge: 2019-02-26 | Disposition: A | Discharge status: Home / Self Care | Payer: PPO | Source: Ambulatory Visit | Attending: Family Medicine | Admitting: Family Medicine

## 2019-02-26 ENCOUNTER — Other Ambulatory Visit: Payer: Self-pay

## 2019-02-26 DIAGNOSIS — Z1231 Encounter for screening mammogram for malignant neoplasm of breast: Secondary | ICD-10-CM | POA: Diagnosis not present

## 2019-03-15 DIAGNOSIS — D510 Vitamin B12 deficiency anemia due to intrinsic factor deficiency: Secondary | ICD-10-CM | POA: Diagnosis not present

## 2019-04-14 DIAGNOSIS — E538 Deficiency of other specified B group vitamins: Secondary | ICD-10-CM | POA: Diagnosis not present

## 2019-05-17 ENCOUNTER — Other Ambulatory Visit: Payer: Self-pay

## 2019-05-17 ENCOUNTER — Ambulatory Visit (HOSPITAL_COMMUNITY)
Admission: RE | Admit: 2019-05-17 | Discharge: 2019-05-17 | Disposition: A | Discharge status: Home / Self Care | Payer: PPO | Source: Ambulatory Visit | Attending: Urology | Admitting: Urology

## 2019-05-17 DIAGNOSIS — E538 Deficiency of other specified B group vitamins: Secondary | ICD-10-CM | POA: Diagnosis not present

## 2019-05-17 DIAGNOSIS — N200 Calculus of kidney: Secondary | ICD-10-CM | POA: Insufficient documentation

## 2019-05-31 DIAGNOSIS — H401131 Primary open-angle glaucoma, bilateral, mild stage: Secondary | ICD-10-CM | POA: Diagnosis not present

## 2019-05-31 DIAGNOSIS — Z961 Presence of intraocular lens: Secondary | ICD-10-CM | POA: Diagnosis not present

## 2019-05-31 DIAGNOSIS — E119 Type 2 diabetes mellitus without complications: Secondary | ICD-10-CM | POA: Diagnosis not present

## 2019-06-02 ENCOUNTER — Telehealth: Payer: Self-pay | Admitting: Urology

## 2019-06-02 NOTE — Telephone Encounter (Signed)
Someone by the name of Jeanice Lim called and stated she has questions about pt's pharmacy and medication. Requests a call back at [phone]

## 2019-06-03 ENCOUNTER — Other Ambulatory Visit: Payer: Self-pay

## 2019-06-03 MED ORDER — POTASSIUM CITRATE ER 15 MEQ (1620 MG) PO TBCR: 2.0000 | EXTENDED_RELEASE_TABLET | Freq: Two times a day (BID) | ORAL | 3 refills | Status: DC

## 2019-06-03 MED ORDER — ALLOPURINOL 300 MG PO TABS: 300.0000 mg | ORAL_TABLET | Freq: Every evening | ORAL | 3 refills | Status: DC

## 2019-06-03 NOTE — Telephone Encounter (Signed)
Medication refills sent to updated pharmacy [facility]

## 2019-06-04 ENCOUNTER — Ambulatory Visit (INDEPENDENT_AMBULATORY_CARE_PROVIDER_SITE_OTHER): Payer: PPO | Admitting: Urology

## 2019-06-04 ENCOUNTER — Other Ambulatory Visit: Payer: Self-pay

## 2019-06-04 VITALS — BP 157/81 | HR 94 | Temp 97.0°F | Ht 63.0 in | Wt 192.0 lb

## 2019-06-04 DIAGNOSIS — N200 Calculus of kidney: Secondary | ICD-10-CM

## 2019-06-04 LAB — POCT URINALYSIS DIPSTICK
Bilirubin, UA: NEGATIVE
Blood, UA: NEGATIVE
Glucose, UA: NEGATIVE
Ketones, UA: NEGATIVE
Nitrite, UA: NEGATIVE
Protein, UA: POSITIVE — AB
Spec Grav, UA: >=1.03 — AB (ref 1.010–1.025)
Urobilinogen, UA: NEGATIVE E.U./dL — AB
pH, UA: 5 (ref 5.0–8.0)

## 2019-06-04 NOTE — Progress Notes (Addendum)
Subjective:  Gwendolyn Snyder returns today in f/u from a second ESWL on 01/21/19 for her recurrent left renal stones.  The initial ESWL was in 7/20.  I reviewed the films and report from her recent CT with her.  There is significant residual stone burden.   She has had no flank pain or hematuria.     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 She remains on allopurinol and potassium citrate. Her stones were 80% UA and 20% CaOx.   She had chemistries in 7/20 and her Calcium was 10.7 and the Cr was 1.25.      ROS:  ROS:  A complete review of systems was performed.  All systems are negative except for pertinent findings as noted.   Review of Systems  Respiratory: Positive for shortness of breath.   Genitourinary: Negative for flank pain and hematuria.  Musculoskeletal: Positive for joint pain.  Skin:       Easy bruising  All other systems reviewed and are negative.   Allergies  Allergen Reactions  . Aspirin Other (See Comments)    Makes ears ring and heart beat fast  . Codeine Other (See Comments)    Hard to wake up  . Crestor [Rosuvastatin Calcium]     Cant move  . Flomax [Tamsulosin Hcl] Nausea And Vomiting    Outpatient Encounter Medications as of 06/04/2019  Medication Sig  . acetaminophen (TYLENOL) 650 MG CR tablet Take 1,300 mg by mouth 2 (two) times daily.  [facility] allopurinol (ZYLOPRIM) 300 MG tablet Take 1 tablet (300 mg total) by mouth every evening.  [facility] amLODipine (NORVASC) 5 MG tablet Take 5 mg by mouth daily.   . cholecalciferol (VITAMIN D) 1000 units tablet Take 1,000 Units by mouth daily.  . Cyanocobalamin (B-12 COMPLIANCE INJECTION IJ) Inject 1 Dose as directed every 30 (thirty) days.  [facility] ezetimibe (ZETIA) 10 MG tablet Take 10 mg by mouth daily.  . Inositol Niacinate (NIACIN FLUSH FREE) 500 MG CAPS Take 500 mg by mouth 2 (two) times daily.   [facility] latanoprost (XALATAN) 0.005 % ophthalmic solution Place 1 drop into both eyes at bedtime.  [facility] lisinopril  (PRINIVIL,ZESTRIL) 20 MG tablet Take 20 mg by mouth 2 (two) times daily.   [facility] omega-3 acid ethyl esters (LOVAZA) 1 g capsule Take 1 g by mouth 2 (two) times daily.   . Potassium Citrate 15 MEQ (1620 MG) TBCR Take 2 tablets by mouth 2 (two) times daily.  . sitaGLIPtin (JANUVIA) 100 MG tablet Take 100 mg by mouth every other day.   . ondansetron (ZOFRAN) 4 MG tablet Take 4 mg by mouth every 8 (eight) hours as needed for nausea or vomiting.  [facility] oxyCODONE-acetaminophen (PERCOCET/ROXICET) 5-325 MG tablet Take 1-2 tablets by mouth every 4 (four) hours as needed for severe pain.   No facility-administered encounter medications on file as of 06/04/2019.    Past Medical History:  Diagnosis Date  . Anemia   . Anxiety   . Arthritis   . Bilateral carpal tunnel syndrome    right hand- surgery , left hand has not and left hand goes to sleep   . Cataracts, bilateral   . Complication of anesthesia    once after surgery felt like she could not breathe- 11/09/15- surgery greater than 11 years ago  . COPD (chronic obstructive pulmonary disease) (HCC)   . Diabetes mellitus without complication (HCC)    diet controlled , type II   .  Family history of adverse reaction to anesthesia    sister- confused  . Glaucoma   . History of hiatal hernia   . History of kidney stones   . Hypertension   . Shortness of breath dyspnea    with exertion    Past Surgical History:  Procedure Laterality Date  . ABDOMINAL HYSTERECTOMY    . CARPAL TUNNEL RELEASE Right   . COLONOSCOPY    . COLONOSCOPY N/A 01/05/2019   Procedure: COLONOSCOPY;  Surgeon: Franky Macho, MD;  [facility];  Service: Gastroenterology;  Laterality: N/A;  . CYSTOSCOPY/RETROGRADE/URETEROSCOPY/STONE EXTRACTION WITH BASKET Left 09/17/2016   Procedure: CYSTOSCOPY/RETROGRADE/URETEROSCOPY/STONE EXTRACTION WITH BASKET/ STENT REMOVAL;  Surgeon: Bjorn Pippin, MD;  Location: [facility];  Service: Urology;  Laterality: Left;  . EXTRACORPOREAL SHOCK WAVE  LITHOTRIPSY Left 11/16/2018   Procedure: EXTRACORPOREAL SHOCK WAVE LITHOTRIPSY (ESWL);  Surgeon: Crista Elliot, MD;  Location: [facility];  Service: Urology;  Laterality: Left;  . EXTRACORPOREAL SHOCK WAVE LITHOTRIPSY Left 01/21/2019   Procedure: EXTRACORPOREAL SHOCK WAVE LITHOTRIPSY (ESWL);  Surgeon: Bjorn Pippin, MD;  Location: [facility];  Service: Urology;  Laterality: Left;  . IR URETERAL STENT LEFT NEW ACCESS W/O SEP NEPHROSTOMY CATH  08/20/2016  . JOINT REPLACEMENT    . NEPHROLITHOTOMY Left 08/20/2016   Procedure: LEFT NEPHROLITHOTOMY PERCUTANEOUS FIRST STAGE;  Surgeon: Bjorn Pippin, MD;  Location: [facility];  Service: Urology;  Laterality: Left;  . NEPHROLITHOTOMY Left 08/22/2016   Procedure: LEFT NEPHROLITHOTOMY PERCUTANEOUS SECOND LOOK;  Surgeon: Bjorn Pippin, MD;  Location: [facility];  Service: Urology;  Laterality: Left;  . Removal kidney stone    . TOTAL HIP ARTHROPLASTY Bilateral   . TOTAL HIP REVISION Left 11/22/2015   Procedure: Revision Left Total Hip Arthroplasty, Poly and Newman Pies Exchange;  Surgeon: Eldred Manges, MD;  Location: [facility];  Service: Orthopedics;  Laterality: Left;  . TOTAL KNEE ARTHROPLASTY Right     Social History   Socioeconomic History  . Marital status: Single    Spouse name: Not on file  . Number of children: Not on file  . Years of education: Not on file  . Highest education level: Not on file  Occupational History  . Not on file  Tobacco Use  . Smoking status: Former Smoker    Packs/day: 1.00    Years: 30.00    Pack years: 30.00    Types: Cigarettes    Quit date: 05/13/2004    Years since quitting: 15.0  . Smokeless tobacco: Never Used  Substance and Sexual Activity  . Alcohol use: No    Alcohol/week: 0.0 standard drinks  . Drug use: No  . Sexual activity: Not on file  Other Topics Concern  . Not on file  Social History Narrative  . Not on file   Social Determinants of Health   Financial Resource Strain:   . Difficulty of Paying Living Expenses: Not on  file  Food Insecurity:   . Worried About Programme researcher, broadcasting/film/video in the Last Year: Not on file  . Ran Out of Food in the Last Year: Not on file  Transportation Needs:   . Lack of Transportation (Medical): Not on file  . Lack of Transportation (Non-Medical): Not on file  Physical Activity:   . Days of Exercise per Week: Not on file  . Minutes of Exercise per Session: Not on file  Stress:   . Feeling of Stress : Not on file  Social Connections:   . Frequency of Communication with  Friends and Family: Not on file  . Frequency of Social Gatherings with Friends and Family: Not on file  . Attends Religious Services: Not on file  . Active Member of Clubs or Organizations: Not on file  . Attends Banker Meetings: Not on file  . Marital Status: Not on file  Intimate Partner Violence:   . Fear of Current or Ex-Partner: Not on file  . Emotionally Abused: Not on file  . Physically Abused: Not on file  . Sexually Abused: Not on file    No family history on file.     Objective: Vitals:   06/04/19 0858  BP: (!) 157/81  Pulse: 94  Temp: (!) 97 F (36.1 C)     Physical Exam  Lab Results:  Results for orders placed or performed in visit on 06/04/19 (from the past 24 hour(s))  POCT urinalysis dipstick     Status: Abnormal   Collection Time: 06/04/19  9:06 AM  Result Value Ref Range   Color, UA yellow    Clarity, UA     Glucose, UA Negative Negative   Bilirubin, UA neg    Ketones, UA neg    Spec Grav, UA >=1.030 (A) 1.010 - 1.025   Blood, UA neg    pH, UA 5.0 5.0 - 8.0   Protein, UA Positive (A) Negative   Urobilinogen, UA negative (A) 0.2 or 1.0 E.U./dL   Nitrite, UA neg    Leukocytes, UA Trace (A) Negative   Appearance clear    Odor      BMET No results for input(s): NA, K, CL, CO2, GLUCOSE, BUN, CREATININE, CALCIUM in the last 72 hours. PSA No results found for: PSA No results found for: TESTOSTERONE    Studies/Results: CT RENAL STONE STUDY  Result  Date: 05/17/2019 CLINICAL DATA:  Follow-up renal calculi. Status post lithotripsy. EXAM: CT ABDOMEN AND PELVIS WITHOUT CONTRAST TECHNIQUE: Multidetector CT imaging of the abdomen and pelvis was performed following the standard protocol without IV contrast. COMPARISON:  08/21/2016 FINDINGS: Lower chest: No acute abnormality. Hepatobiliary: No focal liver abnormality is seen. No gallstones, gallbladder wall thickening, or biliary dilatation. Pancreas: Unremarkable. No pancreatic ductal dilatation or surrounding inflammatory changes. Spleen: Spleen is normal in size. Unchanged low-density foci within the inferior and superior pole of the spleen, most likely reflecting a benign process. Adrenals/Urinary Tract: Normal appearance of the adrenal glands. Right upper pole kidney cyst measures 1.5 cm, image 19/2. Unchanged. Bilateral kidney stones are identified. In the right kidney there are approximately 7 small stones within the upper and lower pole collecting system. The largest stone is in the kidney measuring 4.4 mm, image 79/5. Multiple stones within the left renal collecting system are also noted. This includes a cluster of stones within the upper pole collecting system measuring up to 2 mm. Within the mid to inferior pole collecting system of the left kidney there is a 1.2 cm stone fragment, image 82/5. Bilateral renal cortical thinning. No hydronephrosis. No hydroureter. The distal ureters are not visualized due to streak artifact created by bilateral hip arthroplasty devices. Similarly, the urinary bladder is obscured by streak artifact from hip hardware. Stomach/Bowel: Large hiatal hernia. Stomach is within normal limits. Appendix appears normal. No evidence of bowel wall thickening, distention, or inflammatory changes. No bowel wall thickening, distension or inflammation identified. Vascular/Lymphatic: Aortic atherosclerosis. No aneurysm. No abdominopelvic adenopathy. Reproductive: Hysterectomy. No adnexal mass  noted. Other: No free fluid or fluid collections. Musculoskeletal: Previous bilateral hip arthroplasty. Multi level  degenerative disc disease identified within the thoracolumbar spine. IMPRESSION: 1. No acute findings within the abdomen or pelvis. 2. Bilateral nephrolithiasis.  No obstructive uropathy. 3. Aortic atherosclerosis. 4. Large hiatal hernia. Aortic Atherosclerosis (ICD10-I70.0). Electronically Signed   By: Kerby Moors M.D.   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   She has hypercalcemia on labs from 7/20.   I will add an order for a PTH and Calcium.  I discussed options including continued hydration and medical management vs URS or PCNL which might take more than one procedure.   I think we can safely monitor her for the next 6 months when she will return with a KUB.  She will stay on allopurinol and potassium citrate which we refilled yesterday and I will get her recent labs from Dr. Hilma Favors.    No orders of the defined types were placed in this encounter.    Orders Placed This Encounter  Procedures  . DG Abd 1 View    Standing Status:   Future    Standing Expiration Date:   06/03/2020    Order Specific Question:   Reason for Exam (SYMPTOM  OR DIAGNOSIS REQUIRED)    Answer:   nephrolithiasis    Order Specific Question:   Preferred imaging location?    Answer:   [facility]    Order Specific Question:   Radiology Contrast Protocol - do NOT remove file path    Answer:   [email or web address]  . PTH, intact and calcium    Standing Status:   Future    Standing Expiration Date:   07/05/2019  . POCT urinalysis dipstick      Return in about 6 months (around 12/02/2019) for With KUB..   I will request recent labs from Dr.  Hilma Favors.    CC: Sharilyn Sites, MD      Irine Seal 06/04/2019

## 2019-06-04 NOTE — Addendum Note (Signed)
Addended by: Bjorn Pippin on: 06/04/2019 09:09 PM   Modules accepted: Orders

## 2019-06-13 DIAGNOSIS — N182 Chronic kidney disease, stage 2 (mild): Secondary | ICD-10-CM | POA: Diagnosis not present

## 2019-06-13 DIAGNOSIS — E1122 Type 2 diabetes mellitus with diabetic chronic kidney disease: Secondary | ICD-10-CM | POA: Diagnosis not present

## 2019-06-13 DIAGNOSIS — I700 Atherosclerosis of aorta: Secondary | ICD-10-CM | POA: Diagnosis not present

## 2019-06-13 DIAGNOSIS — I129 Hypertensive chronic kidney disease with stage 1 through stage 4 chronic kidney disease, or unspecified chronic kidney disease: Secondary | ICD-10-CM | POA: Diagnosis not present

## 2019-06-15 DIAGNOSIS — E1129 Type 2 diabetes mellitus with other diabetic kidney complication: Secondary | ICD-10-CM | POA: Diagnosis not present

## 2019-06-15 DIAGNOSIS — I10 Essential (primary) hypertension: Secondary | ICD-10-CM | POA: Diagnosis not present

## 2019-06-15 DIAGNOSIS — Z0001 Encounter for general adult medical examination with abnormal findings: Secondary | ICD-10-CM | POA: Diagnosis not present

## 2019-06-15 DIAGNOSIS — Z6834 Body mass index (BMI) 34.0-34.9, adult: Secondary | ICD-10-CM | POA: Diagnosis not present

## 2019-06-15 DIAGNOSIS — J449 Chronic obstructive pulmonary disease, unspecified: Secondary | ICD-10-CM | POA: Diagnosis not present

## 2019-06-15 DIAGNOSIS — E1165 Type 2 diabetes mellitus with hyperglycemia: Secondary | ICD-10-CM | POA: Diagnosis not present

## 2019-06-15 DIAGNOSIS — Z1389 Encounter for screening for other disorder: Secondary | ICD-10-CM | POA: Diagnosis not present

## 2019-06-15 DIAGNOSIS — N182 Chronic kidney disease, stage 2 (mild): Secondary | ICD-10-CM | POA: Diagnosis not present

## 2019-06-15 DIAGNOSIS — D510 Vitamin B12 deficiency anemia due to intrinsic factor deficiency: Secondary | ICD-10-CM | POA: Diagnosis not present

## 2019-06-15 DIAGNOSIS — E7849 Other hyperlipidemia: Secondary | ICD-10-CM | POA: Diagnosis not present

## 2019-06-16 ENCOUNTER — Other Ambulatory Visit: Payer: Self-pay | Admitting: Family Medicine

## 2019-06-16 DIAGNOSIS — R918 Other nonspecific abnormal finding of lung field: Secondary | ICD-10-CM

## 2019-06-22 IMAGING — MG DIGITAL SCREENING BILATERAL MAMMOGRAM WITH TOMO AND CAD
3 series · 3 of 7 positions shown · non-contrast
Comparison: Previous exam(s).

CLINICAL DATA: Screening.

EXAM:
DIGITAL SCREENING BILATERAL MAMMOGRAM WITH TOMO AND CAD

[L CC synth-2D]
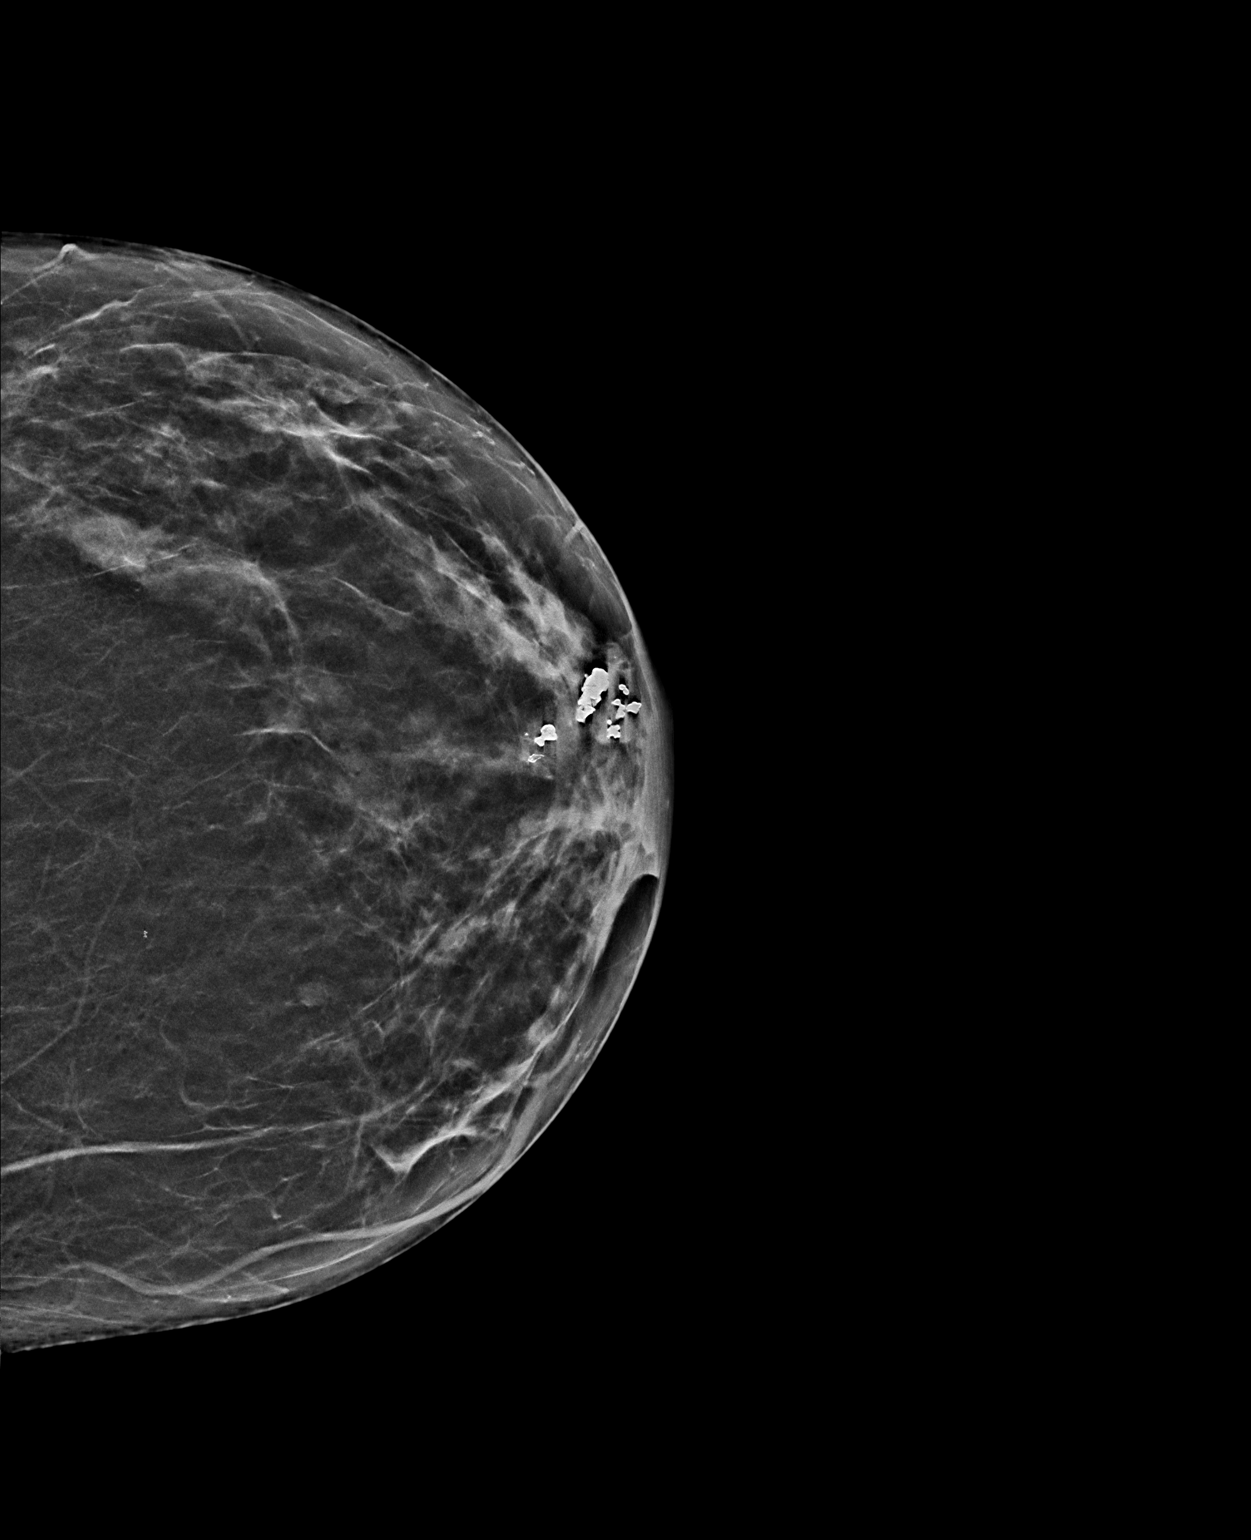

[L MLO synth-2D]
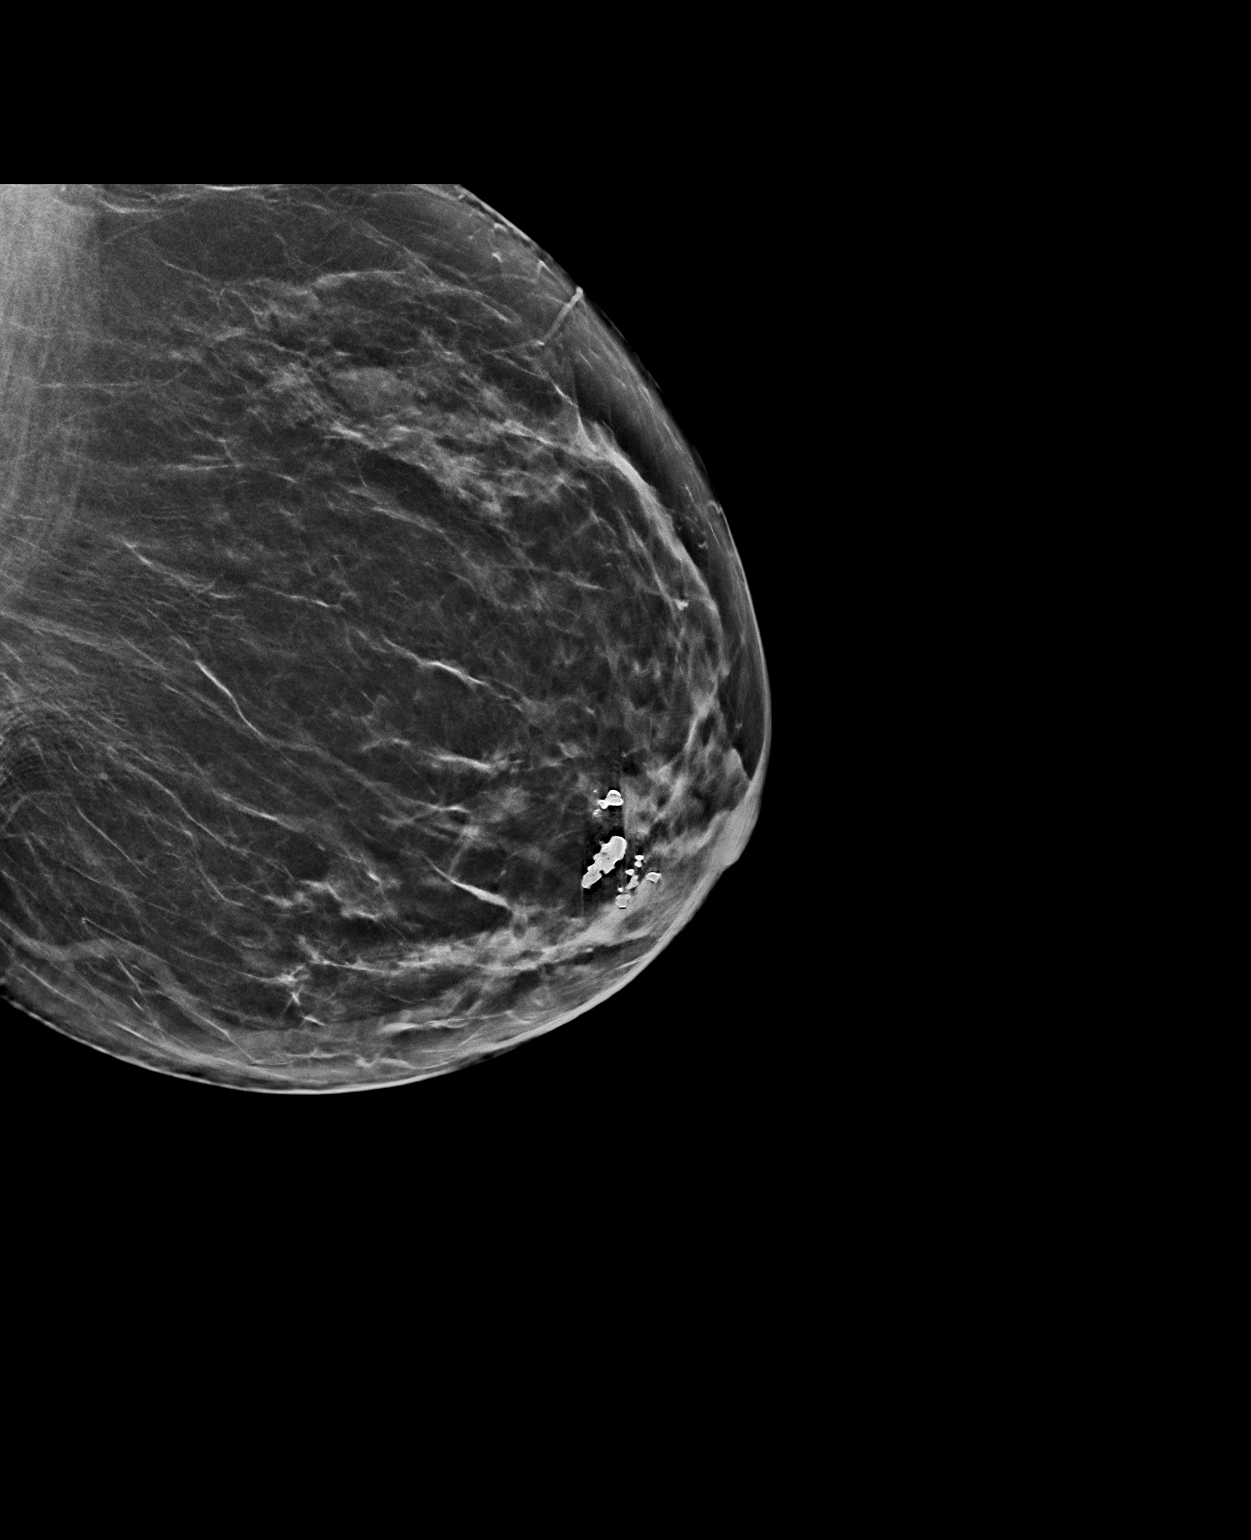

[L CC tomo · tomo slice 28/55.0]
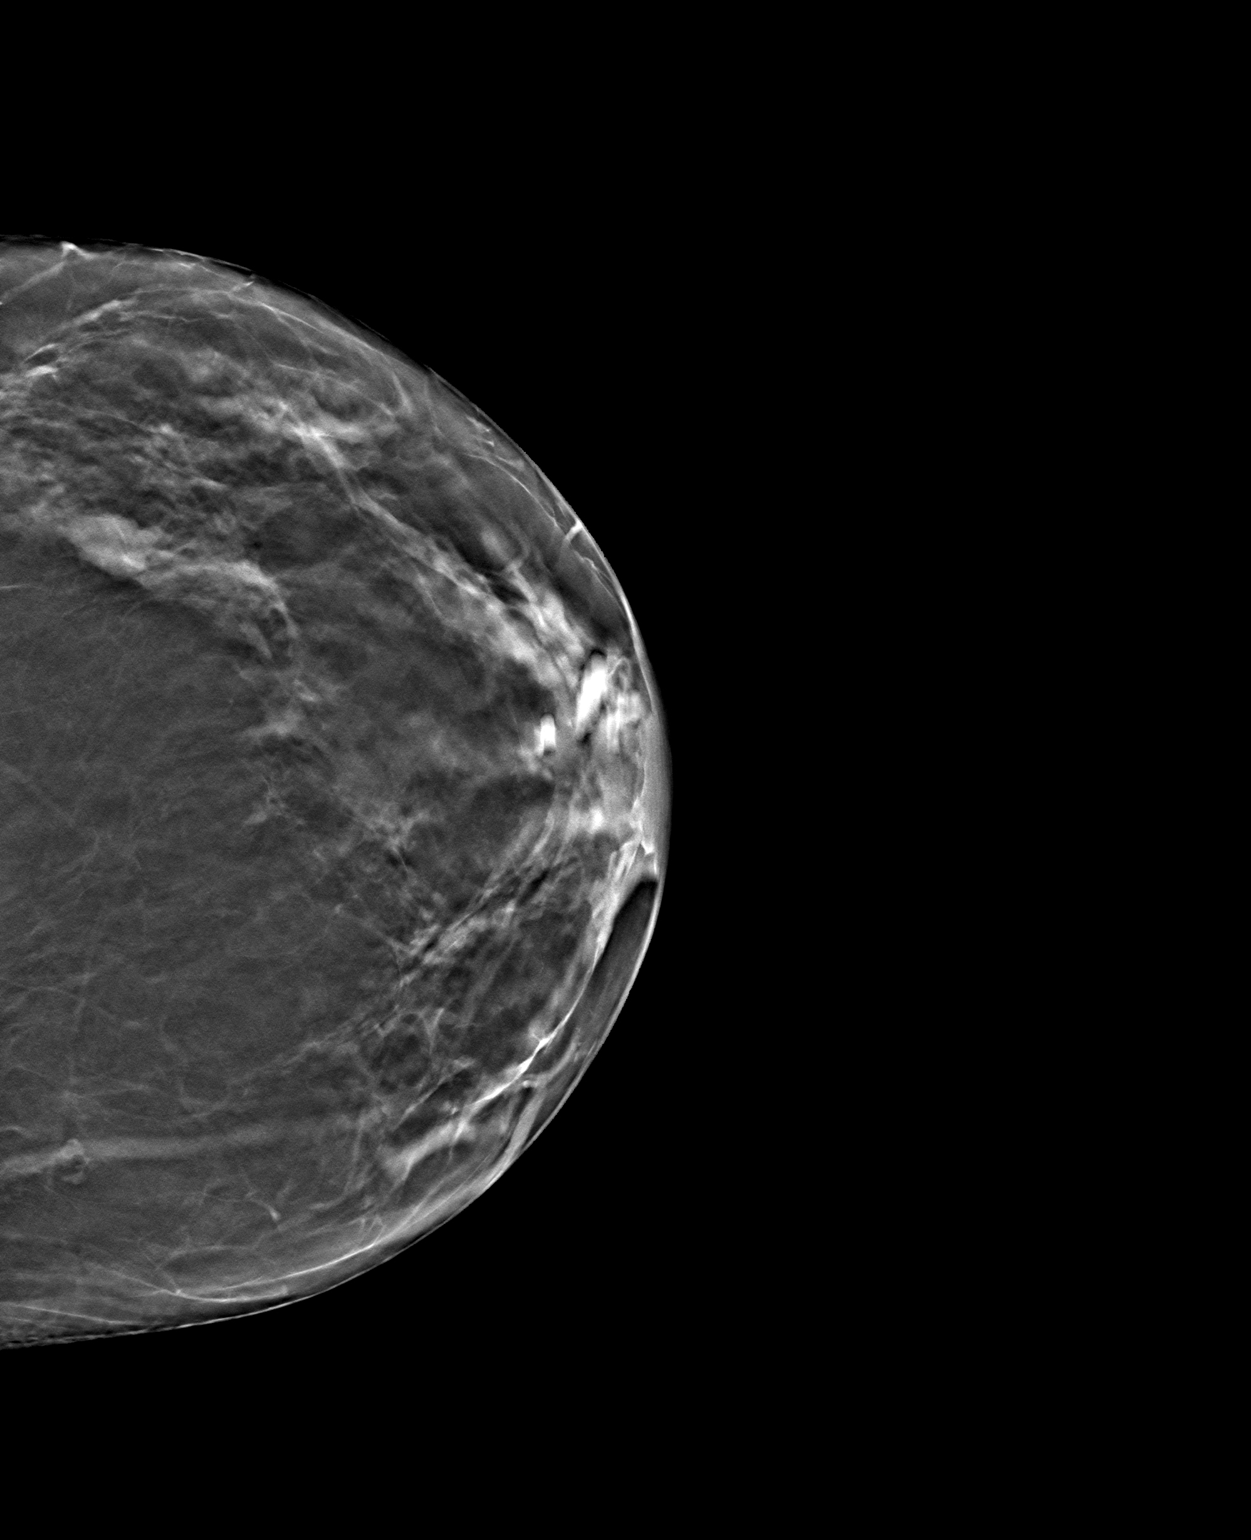

[3 of 7 positions shown; findings below may reference images not displayed]

ACR Breast Density Category b: There are scattered areas of
fibroglandular density.
FINDINGS: There are no findings suspicious for malignancy. Images were
processed with CAD.
IMPRESSION: No mammographic evidence of malignancy. A result letter of this
screening mammogram will be mailed directly to the patient.

RECOMMENDATION:
Screening mammogram in one year. (Code:[id number])

BI-RADS CATEGORY  1: Negative.

## 2019-07-01 ENCOUNTER — Ambulatory Visit (HOSPITAL_COMMUNITY): Payer: PPO

## 2019-07-07 ENCOUNTER — Ambulatory Visit (HOSPITAL_COMMUNITY)
Admission: RE | Admit: 2019-07-07 | Discharge: 2019-07-07 | Disposition: A | Discharge status: Home / Self Care | Payer: PPO | Source: Ambulatory Visit | Attending: Family Medicine | Admitting: Family Medicine

## 2019-07-07 ENCOUNTER — Other Ambulatory Visit: Payer: Self-pay

## 2019-07-07 DIAGNOSIS — R918 Other nonspecific abnormal finding of lung field: Secondary | ICD-10-CM

## 2019-07-13 DIAGNOSIS — D510 Vitamin B12 deficiency anemia due to intrinsic factor deficiency: Secondary | ICD-10-CM | POA: Diagnosis not present

## 2019-07-28 DIAGNOSIS — H401131 Primary open-angle glaucoma, bilateral, mild stage: Secondary | ICD-10-CM | POA: Diagnosis not present

## 2019-08-06 ENCOUNTER — Encounter: Payer: Self-pay | Admitting: Internal Medicine

## 2019-08-06 ENCOUNTER — Ambulatory Visit (INDEPENDENT_AMBULATORY_CARE_PROVIDER_SITE_OTHER): Payer: PPO | Admitting: Internal Medicine

## 2019-08-06 ENCOUNTER — Other Ambulatory Visit: Payer: Self-pay

## 2019-08-06 VITALS — BP 138/72 | HR 115 | Temp 97.9°F | Ht 63.0 in | Wt 190.2 lb

## 2019-08-06 DIAGNOSIS — R0600 Dyspnea, unspecified: Secondary | ICD-10-CM

## 2019-08-06 DIAGNOSIS — Z8249 Family history of ischemic heart disease and other diseases of the circulatory system: Secondary | ICD-10-CM | POA: Diagnosis not present

## 2019-08-06 DIAGNOSIS — I2510 Atherosclerotic heart disease of native coronary artery without angina pectoris: Secondary | ICD-10-CM

## 2019-08-06 DIAGNOSIS — E7849 Other hyperlipidemia: Secondary | ICD-10-CM | POA: Diagnosis not present

## 2019-08-06 DIAGNOSIS — R0609 Other forms of dyspnea: Secondary | ICD-10-CM

## 2019-08-06 MED ORDER — PRAVASTATIN SODIUM 40 MG PO TABS: 40.0000 mg | ORAL_TABLET | Freq: Every evening | ORAL | 3 refills | Status: DC

## 2019-08-06 NOTE — Progress Notes (Signed)
LIPID CLINIC CONSULT NOTE  Chief Complaint:  Dyspnea on exertion, dyslipidemia  Primary Care Physician: Assunta Found, MD  Primary Cardiologist:  No primary care provider on file.  HPI:  Gwendolyn Snyder is a 69 y.o. female who is being seen today for the evaluation of dyslipidemia at the request of Assunta Found, MD.  This is a pleasant 69 year old female kindly referred for evaluation and management of dyslipidemia.  Recently she had a CT scan of the chest which showed multivessel coronary artery calcification.  There is a heart disease history in the family in both parents occluding her mother had an MI at an early age and her father who also had an MI in his 34s.  In addition her brother is a patient of heart care and has known coronary artery disease and high cholesterol.  Most recently her labs in February 21 indicated a total cholesterol of 335, HDL 39, LDL of 224 and triglycerides 342.  Unfortunately, she has not been able to tolerate statins, particularly rosuvastatin and atorvastatin both of which caused significant myalgias.  She is currently on ezetimibe therefore her cholesterol would likely be about 20% higher than it is.  Also of note she is reported some fatigue and shortness of breath with exertion.  She denies any chest pain however given her high coronary calcium score, this is concerning for the possibility of obstructive coronary disease.  PMHx:  Past Medical History:  Diagnosis Date  . Anemia   . Anxiety   . Arthritis   . Bilateral carpal tunnel syndrome    right hand- surgery , left hand has not and left hand goes to sleep   . Cataracts, bilateral   . Complication of anesthesia    once after surgery felt like she could not breathe- 11/09/15- surgery greater than 11 years ago  . COPD (chronic obstructive pulmonary disease) (HCC)   . Diabetes mellitus without complication (HCC)    diet controlled , type II   . Family history of adverse reaction to anesthesia    sister- confused  . Glaucoma   . History of hiatal hernia   . History of kidney stones   . Hypertension   . Shortness of breath dyspnea    with exertion    Past Surgical History:  Procedure Laterality Date  . ABDOMINAL HYSTERECTOMY    . CARPAL TUNNEL RELEASE Right   . COLONOSCOPY    . COLONOSCOPY N/A 01/05/2019   Procedure: COLONOSCOPY;  Surgeon: Franky Macho, MD;  [facility];  Service: Gastroenterology;  Laterality: N/A;  . CYSTOSCOPY/RETROGRADE/URETEROSCOPY/STONE EXTRACTION WITH BASKET Left 09/17/2016   Procedure: CYSTOSCOPY/RETROGRADE/URETEROSCOPY/STONE EXTRACTION WITH BASKET/ STENT REMOVAL;  Surgeon: Bjorn Pippin, MD;  [facility];  Service: Urology;  Laterality: Left;  . EXTRACORPOREAL SHOCK WAVE LITHOTRIPSY Left 11/16/2018   Procedure: EXTRACORPOREAL SHOCK WAVE LITHOTRIPSY (ESWL);  Surgeon: Crista Elliot, MD;  Location: [facility];  Service: Urology;  Laterality: Left;  . EXTRACORPOREAL SHOCK WAVE LITHOTRIPSY Left 01/21/2019   Procedure: EXTRACORPOREAL SHOCK WAVE LITHOTRIPSY (ESWL);  Surgeon: Bjorn Pippin, MD;  [facility];  Service: Urology;  Laterality: Left;  . IR URETERAL STENT LEFT NEW ACCESS W/O SEP NEPHROSTOMY CATH  08/20/2016  . JOINT REPLACEMENT    . NEPHROLITHOTOMY Left 08/20/2016   Procedure: LEFT NEPHROLITHOTOMY PERCUTANEOUS FIRST STAGE;  Surgeon: Bjorn Pippin, MD;  Location: [facility];  Service: Urology;  Laterality: Left;  . NEPHROLITHOTOMY Left 08/22/2016   Procedure: LEFT NEPHROLITHOTOMY PERCUTANEOUS SECOND LOOK;  Surgeon:  Bjorn Pippin, MD;  Location: [facility];  Service: Urology;  Laterality: Left;  . Removal kidney stone    . TOTAL HIP ARTHROPLASTY Bilateral   . TOTAL HIP REVISION Left 11/22/2015   Procedure: Revision Left Total Hip Arthroplasty, Poly and Newman Pies Exchange;  Surgeon: Eldred Manges, MD;  Location: [facility] OR;  Service: Orthopedics;  Laterality: Left;  . TOTAL KNEE ARTHROPLASTY Right     FAMHx:  No family history on file.  SOCHx:   reports that  she quit smoking about 15 years ago. Her smoking use included cigarettes. She has a 30.00 pack-year smoking history. She has never used smokeless tobacco. She reports that she does not drink alcohol or use drugs.  ALLERGIES:  Allergies  Allergen Reactions  . Aspirin Other (See Comments)    Makes ears ring and heart beat fast  . Codeine Other (See Comments)    Hard to wake up  . Crestor [Rosuvastatin Calcium]     Cant move  . Flomax [Tamsulosin Hcl] Nausea And Vomiting    ROS: Pertinent items noted in HPI and remainder of comprehensive ROS otherwise negative.  HOME MEDS: Current Outpatient Medications on File Prior to Visit  Medication Sig Dispense Refill  . acetaminophen (TYLENOL) 650 MG CR tablet Take 1,300 mg by mouth 2 (two) times daily.    [facility] allopurinol (ZYLOPRIM) 300 MG tablet Take 1 tablet (300 mg total) by mouth every evening. 30 tablet 3  . amLODipine (NORVASC) 5 MG tablet Take 5 mg by mouth daily.     . cholecalciferol (VITAMIN D) 1000 units tablet Take 1,000 Units by mouth daily.    . Cyanocobalamin (B-12 COMPLIANCE INJECTION IJ) Inject 1 Dose as directed every 30 (thirty) days.    [facility] ezetimibe (ZETIA) 10 MG tablet Take 10 mg by mouth daily.    . Inositol Niacinate (NIACIN FLUSH FREE) 500 MG CAPS Take 500 mg by mouth 2 (two) times daily.     [facility] latanoprost (XALATAN) 0.005 % ophthalmic solution Place 1 drop into both eyes at bedtime.    [facility] lisinopril (PRINIVIL,ZESTRIL) 20 MG tablet Take 20 mg by mouth 2 (two) times daily.     [facility] omega-3 acid ethyl esters (LOVAZA) 1 g capsule Take 1 g by mouth 2 (two) times daily.     . ondansetron (ZOFRAN) 4 MG tablet Take 4 mg by mouth every 8 (eight) hours as needed for nausea or vomiting.    [facility] oxyCODONE-acetaminophen (PERCOCET/ROXICET) 5-325 MG tablet Take 1-2 tablets by mouth every 4 (four) hours as needed for severe pain.    . sitaGLIPtin (JANUVIA) 100 MG tablet Take 100 mg by mouth every other day.      No current facility-administered  medications on file prior to visit.    LABS/IMAGING: No results found for this or any previous visit (from the past 48 hour(s)). No results found.  LIPID PANEL: No results found for: CHOL, TRIG, HDL, CHOLHDL, VLDL, LDLCALC, LDLDIRECT  WEIGHTS: Wt Readings from Last 3 Encounters:  08/06/19 190 lb 3.2 oz (86.3 kg)  06/04/19 192 lb (87.1 kg)  12/22/18 187 lb (84.8 kg)    VITALS: BP 138/72   Pulse (!) 115   Temp 97.9 F (36.6 C)   Ht 5\' 3"  (1.6 m)   Wt 190 lb 3.2 oz (86.3 kg)   SpO2 95%   BMI 33.69 kg/m   EXAM: General appearance: alert and no distress Neck: no carotid bruit, no JVD and thyroid not enlarged, symmetric, no tenderness/mass/nodules Lungs:  clear to auscultation bilaterally Heart: regular rate and rhythm, S1, S2 normal, no murmur, click, rub or gallop Abdomen: soft, non-tender; bowel sounds normal; no masses,  no organomegaly and obese Extremities: extremities normal, atraumatic, no cyanosis or edema Pulses: 2+ and symmetric Skin: Skin color, texture, turgor normal. No rashes or lesions Neurologic: Grossly normal Psych: Pleasant  EKG: Deferred  ASSESSMENT: 1. Fatigue/DOE-possible anginal equivalent 2. Probable familial hyperlipidemia -[place] score 6 3. Multivessel coronary artery calcification 4. Strong family history of first-degree relatives with coronary disease 5. Statin intolerance-myalgias  PLAN: 1.   Ms. Marca Ancona has been complaining of some fatigue and dyspnea on exertion and was found to have multivessel coronary artery calcification on chest CT.  There is a strong family history of early onset heart disease and I am concerned that she could have significant multivessel disease.  She has probable FH with a high Dutch score, family history of early onset heart disease and untreated LDL cholesterol likely in the upper 200s.  Unfortunately she could not tolerate statins due to side effects.  We discussed options and I am strongly recommending a PCSK9  inhibitor however she says she is deathly terrified of needles.  After further demonstrating this to her she is still would rather try a statin alternative.  Therefore will start pravastatin 40 mg daily.  Hopefully this will be better tolerated.  She should continue on ezetimibe.  She will still not likely be a goal LDL and I would strongly encourage considering a PCSK9 inhibitor.  With regards to her dyspnea and fatigue, while this could represent multivessel disease or obstructive coronary disease, would recommend nuclear stress testing.  She cannot exercise and would recommend a Lexiscan Myoview.  Plan follow-up with me after that.  Thanks again for the kind referral.  Pixie Casino, MD, FACC, [place] Director of the [facility] Lipid [facility]  Card[facility]ovascular [facility] Diplomate of the American Board of Clinical Lipidology Attending Cardiologist  Direct Dial: [phone]  Fax: [phone]  Website:  [email or web address] 08/06/2019, 12:57 PM

## 2019-08-06 NOTE — Patient Instructions (Signed)
Medication Instructions:  START pravastatin 40mg  daily   *If you need a refill on your cardiac medications before your next appointment, please call your pharmacy*   Lab Work: FASTING lab work in 3-4 months to check cholesterol If you have labs (blood work) drawn today and your tests are completely normal, you will receive your results only by: MyChart Message (if you have MyChart) OR . A paper copy in the mail If you have any lab test that is abnormal or we need to change your treatment, we will call you to review the results.   Testing/Procedures: Lexiscan Myoview (stress test) to be completed at [address]   Follow-Up: At [facility], you and your health needs are our priority.  As part of our continuing mission to provide you with exceptional heart care, we have created designated Provider Care Teams.  These Care Teams include your primary Cardiologist (physician) and Advanced Practice Providers (APPs -  Physician Assistants and Nurse Practitioners) who all work together to provide you with the care you need, when you need it.  We recommend signing up for the patient portal called "MyChart".  Sign up information is provided on this After Visit Summary.  MyChart is used to connect with patients for Virtual Visits (Telemedicine).  Patients are able to view lab/test results, encounter notes, upcoming appointments, etc.  Non-urgent messages can be sent to your provider as well.   To learn more about what you can do with MyChart, go to [employer].    Your next appointment:   1 month(s) - after stress test  The format for your next appointment:   In Person  Provider:   [email or web address] Hilty, MD   Other Instructions

## 2019-08-09 ENCOUNTER — Telehealth (HOSPITAL_COMMUNITY): Payer: Self-pay | Admitting: *Deleted

## 2019-08-09 NOTE — Telephone Encounter (Signed)
Patient given detailed instructions per Myocardial Perfusion Study Information Sheet for the test on  08/11/19. Patient notified to arrive 15 minutes early and that it is imperative to arrive on time for appointment to keep from having the test rescheduled.  If you need to cancel or reschedule your appointment, please call the office within 24 hours of your appointment. . Patient verbalized understanding. Tranisha Tissue Jacqueline    

## 2019-08-11 ENCOUNTER — Other Ambulatory Visit: Payer: Self-pay

## 2019-08-11 ENCOUNTER — Ambulatory Visit (HOSPITAL_COMMUNITY): Payer: PPO | Attending: Internal Medicine

## 2019-08-11 DIAGNOSIS — I2510 Atherosclerotic heart disease of native coronary artery without angina pectoris: Secondary | ICD-10-CM | POA: Diagnosis not present

## 2019-08-11 DIAGNOSIS — I129 Hypertensive chronic kidney disease with stage 1 through stage 4 chronic kidney disease, or unspecified chronic kidney disease: Secondary | ICD-10-CM | POA: Diagnosis not present

## 2019-08-11 DIAGNOSIS — Z8249 Family history of ischemic heart disease and other diseases of the circulatory system: Secondary | ICD-10-CM | POA: Diagnosis not present

## 2019-08-11 DIAGNOSIS — E7849 Other hyperlipidemia: Secondary | ICD-10-CM | POA: Diagnosis not present

## 2019-08-11 DIAGNOSIS — E119 Type 2 diabetes mellitus without complications: Secondary | ICD-10-CM | POA: Diagnosis not present

## 2019-08-11 DIAGNOSIS — I10 Essential (primary) hypertension: Secondary | ICD-10-CM | POA: Insufficient documentation

## 2019-08-11 DIAGNOSIS — E1122 Type 2 diabetes mellitus with diabetic chronic kidney disease: Secondary | ICD-10-CM | POA: Diagnosis not present

## 2019-08-11 DIAGNOSIS — J449 Chronic obstructive pulmonary disease, unspecified: Secondary | ICD-10-CM | POA: Diagnosis not present

## 2019-08-11 DIAGNOSIS — N200 Calculus of kidney: Secondary | ICD-10-CM

## 2019-08-11 DIAGNOSIS — N182 Chronic kidney disease, stage 2 (mild): Secondary | ICD-10-CM | POA: Diagnosis not present

## 2019-08-11 LAB — MYOCARDIAL PERFUSION IMAGING
LV dias vol: 53 mL (ref 46–106)
LV sys vol: 16 mL
Peak HR: 103 {beats}/min
Rest HR: 68 {beats}/min
SDS: 3
SRS: 1
SSS: 4
TID: 0.96

## 2019-08-11 MED ORDER — POTASSIUM CHLORIDE CRYS ER 15 MEQ PO TBCR: 30.0000 meq | EXTENDED_RELEASE_TABLET | Freq: Two times a day (BID) | ORAL | 3 refills | Status: DC

## 2019-08-11 MED ORDER — TECHNETIUM TC 99M TETROFOSMIN IV KIT
10.4000 | PACK | Freq: Once | INTRAVENOUS | Status: AC | PRN
  Administered 2019-08-11: 10.4 via INTRAVENOUS
  Filled 2019-08-11: qty 11

## 2019-08-11 MED ORDER — REGADENOSON 0.4 MG/5ML IV SOLN
0.4000 mg | Freq: Once | INTRAVENOUS | Status: AC
  Administered 2019-08-11: 0.4 mg via INTRAVENOUS

## 2019-08-11 MED ORDER — TECHNETIUM TC 99M TETROFOSMIN IV KIT
31.3000 | PACK | Freq: Once | INTRAVENOUS | Status: AC | PRN
  Administered 2019-08-11: 31.3 via INTRAVENOUS
  Filled 2019-08-11: qty 32

## 2019-08-12 DIAGNOSIS — E538 Deficiency of other specified B group vitamins: Secondary | ICD-10-CM | POA: Diagnosis not present

## 2019-08-16 ENCOUNTER — Other Ambulatory Visit: Payer: Self-pay

## 2019-08-16 ENCOUNTER — Telehealth: Payer: Self-pay | Admitting: Urology

## 2019-08-16 DIAGNOSIS — N200 Calculus of kidney: Secondary | ICD-10-CM

## 2019-08-16 MED ORDER — POTASSIUM CITRATE ER 15 MEQ (1620 MG) PO TBCR: 2.0000 | EXTENDED_RELEASE_TABLET | Freq: Two times a day (BID) | ORAL | 3 refills | Status: DC

## 2019-08-16 NOTE — Telephone Encounter (Signed)
Medication potassium chloride was sent in instead of potassium citrate. Corrected prescription sent in.

## 2019-08-16 NOTE — Telephone Encounter (Signed)
Holly at [facility]  requests a call regarding a question with this pts medications.  Her # is [phone]

## 2019-09-10 ENCOUNTER — Encounter: Payer: Self-pay | Admitting: Internal Medicine

## 2019-09-10 ENCOUNTER — Ambulatory Visit (INDEPENDENT_AMBULATORY_CARE_PROVIDER_SITE_OTHER): Payer: PPO | Admitting: Internal Medicine

## 2019-09-10 ENCOUNTER — Other Ambulatory Visit: Payer: Self-pay

## 2019-09-10 VITALS — BP 139/80 | HR 103 | Ht 63.0 in | Wt 188.8 lb

## 2019-09-10 DIAGNOSIS — N182 Chronic kidney disease, stage 2 (mild): Secondary | ICD-10-CM | POA: Diagnosis not present

## 2019-09-10 DIAGNOSIS — I700 Atherosclerosis of aorta: Secondary | ICD-10-CM | POA: Diagnosis not present

## 2019-09-10 DIAGNOSIS — R0600 Dyspnea, unspecified: Secondary | ICD-10-CM | POA: Diagnosis not present

## 2019-09-10 DIAGNOSIS — I2510 Atherosclerotic heart disease of native coronary artery without angina pectoris: Secondary | ICD-10-CM

## 2019-09-10 DIAGNOSIS — E1122 Type 2 diabetes mellitus with diabetic chronic kidney disease: Secondary | ICD-10-CM | POA: Diagnosis not present

## 2019-09-10 DIAGNOSIS — R0609 Other forms of dyspnea: Secondary | ICD-10-CM

## 2019-09-10 DIAGNOSIS — E7849 Other hyperlipidemia: Secondary | ICD-10-CM | POA: Diagnosis not present

## 2019-09-10 DIAGNOSIS — Z8249 Family history of ischemic heart disease and other diseases of the circulatory system: Secondary | ICD-10-CM

## 2019-09-10 DIAGNOSIS — I129 Hypertensive chronic kidney disease with stage 1 through stage 4 chronic kidney disease, or unspecified chronic kidney disease: Secondary | ICD-10-CM | POA: Diagnosis not present

## 2019-09-10 NOTE — Patient Instructions (Signed)
Medication Instructions:  Dr. Rennis Golden has recommended REPATHA 140mg /mL OR Praluent 150mg /mL (injectable cholesterol medication) self-administered once every 14 days STOP pravastatin   *If you need a refill on your cardiac medications before your next appointment, please call your pharmacy*   Follow-Up: At [facility], you and your health needs are our priority.  As part of our continuing mission to provide you with exceptional heart care, we have created designated Provider Care Teams.  These Care Teams include your primary Cardiologist (physician) and Advanced Practice Providers (APPs -  Physician Assistants and Nurse Practitioners) who all work together to provide you with the care you need, when you need it.  We recommend signing up for the patient portal called "MyChart".  Sign up information is provided on this After Visit Summary.  MyChart is used to connect with patients for Virtual Visits (Telemedicine).  Patients are able to view lab/test results, encounter notes, upcoming appointments, etc.  Non-urgent messages can be sent to your provider as well.   To learn more about what you can do with MyChart, go to .    Your next appointment:   As needed with Dr. [facility]

## 2019-09-10 NOTE — Progress Notes (Signed)
LIPID CLINIC CONSULT NOTE  Chief Complaint:  Follow-up dyslipidemia  Primary Care Physician: Assunta Found, MD  Primary Cardiologist:  No primary care provider on file.  HPI:  Gwendolyn Snyder is a 69 y.o. female who is being seen today for the evaluation of dyslipidemia at the request of Assunta Found, MD.  This is a pleasant 69 year old female kindly referred for evaluation and management of dyslipidemia.  Recently she had a CT scan of the chest which showed multivessel coronary artery calcification.  There is a heart disease history in the family in both parents occluding her mother had an MI at an early age and her father who also had an MI in his 2s.  In addition her brother is a patient of heart care and has known coronary artery disease and high cholesterol.  Most recently her labs in February 21 indicated a total cholesterol of 335, HDL 39, LDL of 224 and triglycerides 342.  Unfortunately, she has not been able to tolerate statins, particularly rosuvastatin and atorvastatin both of which caused significant myalgias.  She is currently on ezetimibe therefore her cholesterol would likely be about 20% higher than it is.  Also of note she is reported some fatigue and shortness of breath with exertion.  She denies any chest pain however given her high coronary calcium score, this is concerning for the possibility of obstructive coronary disease.  09/10/19  Gwendolyn Snyder returns today for follow-up.  She underwent nuclear stress testing on 08/11/2019 which was a low risk stress test without ischemia.  LV function was normal.  She reports a small improvement in her shortness of breath.  I wonder if there is underlying COPD that is an issue.  She has been taking pravastatin unfortunately has side effects with the medicine.  This caused some cramping and worsening of her arthritis symptoms.  Unfortunately this point it seems like she is statin intolerant.  I again recommended a PCSK9 inhibitor but she  is incredibly scared of needles.  She does not think that she can give herself injections.  She wondered if it was possible to set up something with her primary to give her injections every 2 weeks.  Think this is unlikely.  PMHx:  Past Medical History:  Diagnosis Date  . Anemia   . Anxiety   . Arthritis   . Bilateral carpal tunnel syndrome    right hand- surgery , left hand has not and left hand goes to sleep   . Cataracts, bilateral   . Complication of anesthesia    once after surgery felt like she could not breathe- 11/09/15- surgery greater than 11 years ago  . COPD (chronic obstructive pulmonary disease) (HCC)   . Diabetes mellitus without complication (HCC)    diet controlled , type II   . Family history of adverse reaction to anesthesia    sister- confused  . Glaucoma   . History of hiatal hernia   . History of kidney stones   . Hypertension   . Shortness of breath dyspnea    with exertion    Past Surgical History:  Procedure Laterality Date  . ABDOMINAL HYSTERECTOMY    . CARPAL TUNNEL RELEASE Right   . COLONOSCOPY    . COLONOSCOPY N/A 01/05/2019   Procedure: COLONOSCOPY;  Surgeon: Franky Macho, MD;  [facility];  Service: Gastroenterology;  Laterality: N/A;  . CYSTOSCOPY/RETROGRADE/URETEROSCOPY/STONE EXTRACTION WITH BASKET Left 09/17/2016   Procedure: CYSTOSCOPY/RETROGRADE/URETEROSCOPY/STONE EXTRACTION WITH BASKET/ STENT REMOVAL;  Surgeon: Annabell Howells,  Jenny Reichmann, MD;  [facility];  Service: Urology;  Laterality: Left;  . EXTRACORPOREAL SHOCK WAVE LITHOTRIPSY Left 11/16/2018   Procedure: EXTRACORPOREAL SHOCK WAVE LITHOTRIPSY (ESWL);  Surgeon: Lucas Mallow, MD;  [facility];  Service: Urology;  Laterality: Left;  . EXTRACORPOREAL SHOCK WAVE LITHOTRIPSY Left 01/21/2019   Procedure: EXTRACORPOREAL SHOCK WAVE LITHOTRIPSY (ESWL);  Surgeon: Irine Seal, MD;  [facility];  Service: Urology;  Laterality: Left;  . IR URETERAL STENT LEFT NEW ACCESS W/O SEP  NEPHROSTOMY CATH  08/20/2016  . JOINT REPLACEMENT    . NEPHROLITHOTOMY Left 08/20/2016   Procedure: LEFT NEPHROLITHOTOMY PERCUTANEOUS FIRST STAGE;  Surgeon: Irine Seal, MD;  [facility];  Service: Urology;  Laterality: Left;  . NEPHROLITHOTOMY Left 08/22/2016   Procedure: LEFT NEPHROLITHOTOMY PERCUTANEOUS SECOND LOOK;  Surgeon: Irine Seal, MD;  [facility];  Service: Urology;  Laterality: Left;  . Removal kidney stone    . TOTAL HIP ARTHROPLASTY Bilateral   . TOTAL HIP REVISION Left 11/22/2015   Procedure: Revision Left Total Hip Arthroplasty, Poly and Diona Foley Exchange;  Surgeon: Marybelle Killings, MD;  Location: [facility];  Service: Orthopedics;  Laterality: Left;  . TOTAL KNEE ARTHROPLASTY Right     FAMHx:  No family history on file.  SOCHx:   reports that she quit smoking about 15 years ago. Her smoking use included cigarettes. She has a 30.00 pack-year smoking history. She has never used smokeless tobacco. She reports that she does not drink alcohol or use drugs.  ALLERGIES:  Allergies  Allergen Reactions  . Aspirin Other (See Comments)    Makes ears ring and heart beat fast  . Codeine Other (See Comments)    Hard to wake up  . Crestor [Rosuvastatin Calcium]     Cant move  . Flomax [Tamsulosin Hcl] Nausea And Vomiting    ROS: Pertinent items noted in HPI and remainder of comprehensive ROS otherwise negative.  HOME MEDS: Current Outpatient Medications on File Prior to Visit  Medication Sig Dispense Refill  . acetaminophen (TYLENOL) 650 MG CR tablet Take 1,300 mg by mouth 2 (two) times daily.    Marland Kitchen allopurinol (ZYLOPRIM) 300 MG tablet Take 1 tablet (300 mg total) by mouth every evening. 30 tablet 3  . amLODipine (NORVASC) 5 MG tablet Take 5 mg by mouth daily.     . cholecalciferol (VITAMIN D) 1000 units tablet Take 1,000 Units by mouth daily.    . Cyanocobalamin (B-12 COMPLIANCE INJECTION IJ) Inject 1 Dose as directed every 30 (thirty) days.    Marland Kitchen ezetimibe (ZETIA) 10 MG tablet  Take 10 mg by mouth daily.    . Inositol Niacinate (NIACIN FLUSH FREE) 500 MG CAPS Take 500 mg by mouth 2 (two) times daily.     Marland Kitchen latanoprost (XALATAN) 0.005 % ophthalmic solution Place 1 drop into both eyes at bedtime.    Marland Kitchen lisinopril (PRINIVIL,ZESTRIL) 20 MG tablet Take 20 mg by mouth 2 (two) times daily.     Marland Kitchen omega-3 acid ethyl esters (LOVAZA) 1 g capsule Take 1 g by mouth 2 (two) times daily.     . ondansetron (ZOFRAN) 4 MG tablet Take 4 mg by mouth every 8 (eight) hours as needed for nausea or vomiting.    Marland Kitchen oxyCODONE-acetaminophen (PERCOCET/ROXICET) 5-325 MG tablet Take 1-2 tablets by mouth every 4 (four) hours as needed for severe pain.    Marland Kitchen Potassium Citrate 15 MEQ (1620 MG) TBCR Take 2 tablets by mouth in the morning and at  bedtime. 120 tablet 3  . sitaGLIPtin (JANUVIA) 100 MG tablet Take 100 mg by mouth every other day.      No current facility-administered medications on file prior to visit.    LABS/IMAGING: No results found for this or any previous visit (from the past 48 hour(s)). No results found.  LIPID PANEL: No results found for: CHOL, TRIG, HDL, CHOLHDL, VLDL, LDLCALC, LDLDIRECT  WEIGHTS: Wt Readings from Last 3 Encounters:  08/11/19 190 lb (86.2 kg)  08/06/19 190 lb 3.2 oz (86.3 kg)  06/04/19 192 lb (87.1 kg)    VITALS: There were no vitals taken for this visit.  EXAM: Deferred  EKG: Deferred  ASSESSMENT: 1. Fatigue/DOE-possible anginal equivalent-low risk Myoview stress test without ischemia (07/2019) 2. Probable familial hyperlipidemia -[place] score 6 3. Multivessel coronary artery calcification 4. Strong family history of first-degree relatives with coronary disease 5. Statin intolerance-myalgias  PLAN: 1.   Gwendolyn Snyder unfortunately is statin intolerant and is not tolerating pravastatin.  I recommended discontinuing it.  She is a good candidate for PCSK9 inhibitor however is extremely reluctant to give herself an injection.  We also discussed the  infusion pump given once monthly however this also contains a needle.  She needs to consider it further.  She will follow-up with [place] as needed.  Chrystie Nose, MD, [name], FACP  [place]  [facility]  Medical Director of the Advanced Lipid Disorders &  Cardiovascular Risk Reduction Clinic Diplomate of the American Board of Clinical Lipidology Attending Cardiologist  Direct Dial: [phone]  Fax: [phone]  Website:  [email or web address] 09/10/2019, 4:00 PM

## 2019-09-13 DIAGNOSIS — D510 Vitamin B12 deficiency anemia due to intrinsic factor deficiency: Secondary | ICD-10-CM | POA: Diagnosis not present

## 2019-10-11 DIAGNOSIS — I700 Atherosclerosis of aorta: Secondary | ICD-10-CM | POA: Diagnosis not present

## 2019-10-11 DIAGNOSIS — N182 Chronic kidney disease, stage 2 (mild): Secondary | ICD-10-CM | POA: Diagnosis not present

## 2019-10-11 DIAGNOSIS — I129 Hypertensive chronic kidney disease with stage 1 through stage 4 chronic kidney disease, or unspecified chronic kidney disease: Secondary | ICD-10-CM | POA: Diagnosis not present

## 2019-10-11 DIAGNOSIS — E1122 Type 2 diabetes mellitus with diabetic chronic kidney disease: Secondary | ICD-10-CM | POA: Diagnosis not present

## 2019-10-13 DIAGNOSIS — E538 Deficiency of other specified B group vitamins: Secondary | ICD-10-CM | POA: Diagnosis not present

## 2019-10-15 DIAGNOSIS — H401131 Primary open-angle glaucoma, bilateral, mild stage: Secondary | ICD-10-CM | POA: Diagnosis not present

## 2019-11-10 DIAGNOSIS — E1122 Type 2 diabetes mellitus with diabetic chronic kidney disease: Secondary | ICD-10-CM | POA: Diagnosis not present

## 2019-11-10 DIAGNOSIS — N182 Chronic kidney disease, stage 2 (mild): Secondary | ICD-10-CM | POA: Diagnosis not present

## 2019-11-10 DIAGNOSIS — I700 Atherosclerosis of aorta: Secondary | ICD-10-CM | POA: Diagnosis not present

## 2019-11-10 DIAGNOSIS — I129 Hypertensive chronic kidney disease with stage 1 through stage 4 chronic kidney disease, or unspecified chronic kidney disease: Secondary | ICD-10-CM | POA: Diagnosis not present

## 2019-11-11 DIAGNOSIS — E538 Deficiency of other specified B group vitamins: Secondary | ICD-10-CM | POA: Diagnosis not present

## 2019-11-17 DIAGNOSIS — H401111 Primary open-angle glaucoma, right eye, mild stage: Secondary | ICD-10-CM | POA: Diagnosis not present

## 2019-11-22 ENCOUNTER — Ambulatory Visit (HOSPITAL_COMMUNITY)
Admission: RE | Admit: 2019-11-22 | Discharge: 2019-11-22 | Disposition: A | Discharge status: Home / Self Care | Payer: PPO | Source: Ambulatory Visit | Attending: Urology | Admitting: Urology

## 2019-11-22 ENCOUNTER — Other Ambulatory Visit: Payer: Self-pay

## 2019-11-22 DIAGNOSIS — M47816 Spondylosis without myelopathy or radiculopathy, lumbar region: Secondary | ICD-10-CM | POA: Diagnosis not present

## 2019-11-22 DIAGNOSIS — N200 Calculus of kidney: Secondary | ICD-10-CM | POA: Diagnosis not present

## 2019-11-26 ENCOUNTER — Other Ambulatory Visit: Payer: Self-pay

## 2019-11-26 ENCOUNTER — Encounter: Payer: Self-pay | Admitting: Urology

## 2019-11-26 ENCOUNTER — Ambulatory Visit: Payer: PPO | Admitting: Urology

## 2019-11-26 VITALS — BP 110/66 | HR 111 | Temp 98.6°F | Ht 63.0 in | Wt 185.0 lb

## 2019-11-26 DIAGNOSIS — N200 Calculus of kidney: Secondary | ICD-10-CM

## 2019-11-26 LAB — POCT URINALYSIS DIPSTICK
Bilirubin, UA: NEGATIVE
Blood, UA: NEGATIVE
Glucose, UA: NEGATIVE
Ketones, UA: NEGATIVE
Leukocytes, UA: NEGATIVE
Nitrite, UA: NEGATIVE
Protein, UA: POSITIVE — AB
Spec Grav, UA: >=1.03 — AB (ref 1.010–1.025)
Urobilinogen, UA: 0.2 E.U./dL
pH, UA: 5 (ref 5.0–8.0)

## 2019-11-26 MED ORDER — ALLOPURINOL 300 MG PO TABS: 300.0000 mg | ORAL_TABLET | Freq: Every evening | ORAL | 3 refills | Status: DC

## 2019-11-26 MED ORDER — POTASSIUM CITRATE ER 15 MEQ (1620 MG) PO TBCR: EXTENDED_RELEASE_TABLET | ORAL | 3 refills | Status: DC

## 2019-11-26 NOTE — Progress Notes (Signed)
Subjective:  Gwendolyn Snyder returns today in f/u from a second ESWL on 01/21/19 for her recurrent left renal stones.  The initial ESWL was in 7/20.  I reviewed the films and report from her recent CT with her.  There is significant residual stone burden.   She has had no flank pain or hematuria.   A KUB prior to this visit shows stable bilateral renal stones, left > right, and no ureteral stone.  UA is clear.    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 She remains on allopurinol and potassium citrate. Her stones were 80% UA and 20% CaOx.   She had chemistries in 7/20 and her Calcium was 10.7 and the Cr was 1.25.   She is due labs with Dr. Phillips Odor in the near future.      ROS:  ROS:  A complete review of systems was performed.  All systems are negative except for pertinent findings as noted.   ROS  Allergies  Allergen Reactions  . Aspirin Other (See Comments)    Makes ears ring and heart beat fast  . Codeine Other (See Comments)    Hard to wake up  . Crestor [Rosuvastatin Calcium]     Cant move  . Flomax [Tamsulosin Hcl] Nausea And Vomiting  . Pravastatin     Myalgias     Outpatient Encounter Medications as of 11/26/2019  Medication Sig  . acetaminophen (TYLENOL) 650 MG CR tablet Take 1,300 mg by mouth 2 (two) times daily.  Marland Kitchen allopurinol (ZYLOPRIM) 300 MG tablet Take 1 tablet (300 mg total) by mouth every evening.  Marland Kitchen amLODipine (NORVASC) 5 MG tablet Take 5 mg by mouth daily.   . cholecalciferol (VITAMIN D) 1000 units tablet Take 1,000 Units by mouth daily.  . Cyanocobalamin (B-12 COMPLIANCE INJECTION IJ) Inject 1 Dose as directed every 30 (thirty) days.  Marland Kitchen ezetimibe (ZETIA) 10 MG tablet Take 10 mg by mouth daily.  . Inositol Niacinate (NIACIN FLUSH FREE) 500 MG CAPS Take 500 mg by mouth 2 (two) times daily.   Marland Kitchen latanoprost (XALATAN) 0.005 % ophthalmic solution Place 1 drop into both eyes at bedtime.  Marland Kitchen lisinopril (PRINIVIL,ZESTRIL) 20 MG tablet Take 20 mg  by mouth 2 (two) times daily.   Marland Kitchen omega-3 acid ethyl esters (LOVAZA) 1 g capsule Take 1 g by mouth 2 (two) times daily.   . ondansetron (ZOFRAN) 4 MG tablet Take 4 mg by mouth every 8 (eight) hours as needed for nausea or vomiting.  Marland Kitchen oxyCODONE-acetaminophen (PERCOCET/ROXICET) 5-325 MG tablet Take 1-2 tablets by mouth every 4 (four) hours as needed for severe pain.  Marland Kitchen Potassium Citrate 15 MEQ (1620 MG) TBCR Take 2 tabs qam and 1 tab qpm po  . pravastatin (PRAVACHOL) 40 MG tablet Take 40 mg by mouth at bedtime.  . prednisoLONE acetate (PRED FORTE) 1 % ophthalmic suspension Place into the right eye.  . sitaGLIPtin (JANUVIA) 100 MG tablet Take 100 mg by mouth every other day.   . [DISCONTINUED] allopurinol (ZYLOPRIM) 300 MG tablet Take 1 tablet (300 mg total) by mouth every evening.  . [DISCONTINUED] Potassium Citrate 15 MEQ (1620 MG) TBCR Take 2 tablets by mouth in the morning and at bedtime.   No facility-administered encounter medications on file as of 11/26/2019.    Past Medical History:  Diagnosis Date  . Anemia   . Anxiety   . Arthritis   . Bilateral carpal tunnel syndrome    right  hand- surgery , left hand has not and left hand goes to sleep   . Cataracts, bilateral   . Complication of anesthesia    once after surgery felt like she could not breathe- 11/09/15- surgery greater than 11 years ago  . COPD (chronic obstructive pulmonary disease) (HCC)   . Diabetes mellitus without complication (HCC)    diet controlled , type II   . Family history of adverse reaction to anesthesia    sister- confused  . Glaucoma   . History of hiatal hernia   . History of kidney stones   . Hypertension   . Shortness of breath dyspnea    with exertion    Past Surgical History:  Procedure Laterality Date  . ABDOMINAL HYSTERECTOMY    . CARPAL TUNNEL RELEASE Right   . COLONOSCOPY    . COLONOSCOPY N/A 01/05/2019   Procedure: COLONOSCOPY;  Surgeon: Franky Macho, MD;  [facility];   Service: Gastroenterology;  Laterality: N/A;  . CYSTOSCOPY/RETROGRADE/URETEROSCOPY/STONE EXTRACTION WITH BASKET Left 09/17/2016   Procedure: CYSTOSCOPY/RETROGRADE/URETEROSCOPY/STONE EXTRACTION WITH BASKET/ STENT REMOVAL;  Surgeon: Bjorn Pippin, MD;  [facility];  Service: Urology;  Laterality: Left;  . EXTRACORPOREAL SHOCK WAVE LITHOTRIPSY Left 11/16/2018   Procedure: EXTRACORPOREAL SHOCK WAVE LITHOTRIPSY (ESWL);  Surgeon: Crista Elliot, MD;  [facility];  Service: Urology;  Laterality: Left;  . EXTRACORPOREAL SHOCK WAVE LITHOTRIPSY Left 01/21/2019   Procedure: EXTRACORPOREAL SHOCK WAVE LITHOTRIPSY (ESWL);  Surgeon: Bjorn Pippin, MD;  [facility];  Service: Urology;  Laterality: Left;  . IR URETERAL STENT LEFT NEW ACCESS W/O SEP NEPHROSTOMY CATH  08/20/2016  . JOINT REPLACEMENT    . NEPHROLITHOTOMY Left 08/20/2016   Procedure: LEFT NEPHROLITHOTOMY PERCUTANEOUS FIRST STAGE;  Surgeon: Bjorn Pippin, MD;  [facility];  Service: Urology;  Laterality: Left;  . NEPHROLITHOTOMY Left 08/22/2016   Procedure: LEFT NEPHROLITHOTOMY PERCUTANEOUS SECOND LOOK;  Surgeon: Bjorn Pippin, MD;  [facility];  Service: Urology;  Laterality: Left;  . Removal kidney stone    . TOTAL HIP ARTHROPLASTY Bilateral   . TOTAL HIP REVISION Left 11/22/2015   Procedure: Revision Left Total Hip Arthroplasty, Poly and Newman Pies Exchange;  Surgeon: Eldred Manges, MD;  Location: [facility];  Service: Orthopedics;  Laterality: Left;  . TOTAL KNEE ARTHROPLASTY Right     Social History   Socioeconomic History  . Marital status: Single    Spouse name: Not on file  . Number of children: Not on file  . Years of education: Not on file  . Highest education level: Not on file  Occupational History  . Not on file  Tobacco Use  . Smoking status: Former Smoker    Packs/day: 1.00    Years: 30.00    Pack years: 30.00    Types: Cigarettes    Quit date: 05/13/2004    Years since quitting: 15.5  . Smokeless tobacco: Never Used   Vaping Use  . Vaping Use: Never used  Substance and Sexual Activity  . Alcohol use: No    Alcohol/week: 0.0 standard drinks  . Drug use: No  . Sexual activity: Not on file  Other Topics Concern  . Not on file  Social History Narrative  . Not on file   Social Determinants of Health   Financial Resource Strain:   . Difficulty of Paying Living Expenses:   Food Insecurity:   . Worried About Programme researcher, broadcasting/film/video in the Last Year:   . [employer]  in the Last Year:   Transportation Needs:   . Freight forwarderLack of Transportation (Medical):   Marland Kitchen. Lack of Transportation (Non-Medical):   Physical Activity:   . Days of Exercise per Week:   . Minutes of Exercise per Session:   Stress:   . Feeling of Stress :   Social Connections:   . Frequency of Communication with Friends and Family:   . Frequency of Social Gatherings with Friends and Family:   . Attends Religious Services:   . Active Member of Clubs or Organizations:   . Attends BankerClub or Organization Meetings:   Marland Kitchen. Marital Status:   Intimate Partner Violence:   . Fear of Current or Ex-Partner:   . Emotionally Abused:   Marland Kitchen. Physically Abused:   . Sexually Abused:     History reviewed. No pertinent family history.     Objective: Vitals:   11/26/19 1053  BP: 110/66  Pulse: (!) 111  Temp: 98.6 F (37 C)     Physical Exam  Lab Results:  Results for orders placed or performed in visit on 11/26/19 (from the past 24 hour(s))  POCT urinalysis dipstick     Status: Abnormal   Collection Time: 11/26/19 11:03 AM  Result Value Ref Range   Color, UA yellow    Clarity, UA     Glucose, UA Negative Negative   Bilirubin, UA neg    Ketones, UA neg    Spec Grav, UA >=1.030 (A) 1.010 - 1.025   Blood, UA neg    pH, UA 5.0 5.0 - 8.0   Protein, UA Positive (A) Negative   Urobilinogen, UA 0.2 0.2 or 1.0 E.U./dL   Nitrite, UA neg    Leukocytes, UA Negative Negative   Appearance clear    Odor      BMET No results for input(s): NA, K, CL,  CO2, GLUCOSE, BUN, CREATININE, CALCIUM in the last 72 hours. PSA No results found for: PSA No results found for: TESTOSTERONE    Studies/Results: DG Abd 1 View  Result Date: 11/22/2019 CLINICAL DATA:  Nephrolithiasis EXAM: ABDOMEN - 1 VIEW COMPARISON:  05/17/2019 FINDINGS: The bowel gas pattern is normal. There are numerous bilateral renal calculi measuring up to 13 mm within the midpole of the left kidney and approximately 4 mm within the lower pole of the right kidney. Degenerative changes of the lumbar spine. Bilateral hip arthroplasties. IMPRESSION: Bilateral nephrolithiasis, as described. Electronically Signed   By: Duanne GuessNicholas  Plundo D.O.   On: 11/22/2019 15:10     Assessment & Plan: Bilateral renal stones with a stable stone burden left > right.  KUB in a year.  Meds refilled.   History of hypercalcemia.  I will get her to have her labs sent from Dr. Phillips OdorGolding.     Meds ordered this encounter  Medications  . allopurinol (ZYLOPRIM) 300 MG tablet    Sig: Take 1 tablet (300 mg total) by mouth every evening.    Dispense:  90 tablet    Refill:  3  . Potassium Citrate 15 MEQ (1620 MG) TBCR    Sig: Take 2 tabs qam and 1 tab qpm po    Dispense:  270 tablet    Refill:  3     Orders Placed This Encounter  Procedures  . DG Abd 1 View    Standing Status:   Future    Standing Expiration Date:   11/25/2020    Order Specific Question:   Reason for Exam (SYMPTOM  OR DIAGNOSIS REQUIRED)  Answer:   nephrolithiasis    Order Specific Question:   Preferred imaging location?    Answer:   [facility]    Order Specific Question:   Radiology Contrast Protocol - do NOT remove file path    Answer:   [email or web address]  . Uric acid    Standing Status:   Future    Standing Expiration Date:   11/25/2020  . Basic metabolic panel    Standing Status:   Future    Standing Expiration Date:   11/25/2020  . POCT urinalysis dipstick      No follow-ups  on file.   I will request recent labs from Dr. Phillips Odor.    CC: Assunta Found, MD      Bjorn Pippin 11/26/2019

## 2019-11-26 NOTE — Progress Notes (Signed)

## 2019-12-03 ENCOUNTER — Ambulatory Visit: Payer: PPO | Admitting: Urology

## 2019-12-10 DIAGNOSIS — I129 Hypertensive chronic kidney disease with stage 1 through stage 4 chronic kidney disease, or unspecified chronic kidney disease: Secondary | ICD-10-CM | POA: Diagnosis not present

## 2019-12-10 DIAGNOSIS — N182 Chronic kidney disease, stage 2 (mild): Secondary | ICD-10-CM | POA: Diagnosis not present

## 2019-12-10 DIAGNOSIS — I700 Atherosclerosis of aorta: Secondary | ICD-10-CM | POA: Diagnosis not present

## 2019-12-10 DIAGNOSIS — E1122 Type 2 diabetes mellitus with diabetic chronic kidney disease: Secondary | ICD-10-CM | POA: Diagnosis not present

## 2019-12-13 DIAGNOSIS — D510 Vitamin B12 deficiency anemia due to intrinsic factor deficiency: Secondary | ICD-10-CM | POA: Diagnosis not present

## 2019-12-29 DIAGNOSIS — H401111 Primary open-angle glaucoma, right eye, mild stage: Secondary | ICD-10-CM | POA: Diagnosis not present

## 2020-01-11 DIAGNOSIS — N182 Chronic kidney disease, stage 2 (mild): Secondary | ICD-10-CM | POA: Diagnosis not present

## 2020-01-11 DIAGNOSIS — I700 Atherosclerosis of aorta: Secondary | ICD-10-CM | POA: Diagnosis not present

## 2020-01-11 DIAGNOSIS — I129 Hypertensive chronic kidney disease with stage 1 through stage 4 chronic kidney disease, or unspecified chronic kidney disease: Secondary | ICD-10-CM | POA: Diagnosis not present

## 2020-01-11 DIAGNOSIS — E1122 Type 2 diabetes mellitus with diabetic chronic kidney disease: Secondary | ICD-10-CM | POA: Diagnosis not present

## 2020-01-13 DIAGNOSIS — R799 Abnormal finding of blood chemistry, unspecified: Secondary | ICD-10-CM | POA: Diagnosis not present

## 2020-02-10 DIAGNOSIS — N182 Chronic kidney disease, stage 2 (mild): Secondary | ICD-10-CM | POA: Diagnosis not present

## 2020-02-10 DIAGNOSIS — I700 Atherosclerosis of aorta: Secondary | ICD-10-CM | POA: Diagnosis not present

## 2020-02-10 DIAGNOSIS — E1122 Type 2 diabetes mellitus with diabetic chronic kidney disease: Secondary | ICD-10-CM | POA: Diagnosis not present

## 2020-02-10 DIAGNOSIS — N200 Calculus of kidney: Secondary | ICD-10-CM | POA: Diagnosis not present

## 2020-02-11 ENCOUNTER — Telehealth: Payer: Self-pay | Admitting: Urology

## 2020-02-11 DIAGNOSIS — D510 Vitamin B12 deficiency anemia due to intrinsic factor deficiency: Secondary | ICD-10-CM | POA: Diagnosis not present

## 2020-02-11 DIAGNOSIS — Z23 Encounter for immunization: Secondary | ICD-10-CM | POA: Diagnosis not present

## 2020-02-11 NOTE — Telephone Encounter (Signed)
Attempted to call pharmacy back. LMTRC.

## 2020-02-11 NOTE — Telephone Encounter (Signed)
Pharmacy called asking about pts potassium citrate prescription. She said she needs a call back today as they are shipping pts meds.

## 2020-02-14 NOTE — Telephone Encounter (Signed)
Pt called this morning saying the pharmacy said they had a question about the rx for Potasium Citrate. I told I would call them. I called pharmacy and asked was it correct that pt was to get 2 pills in the morning and one at hs. I explained pt and drs note both confirmed this is the correct amount to dispense.

## 2020-03-06 ENCOUNTER — Other Ambulatory Visit (HOSPITAL_COMMUNITY): Payer: Self-pay | Admitting: Family Medicine

## 2020-03-06 DIAGNOSIS — Z1231 Encounter for screening mammogram for malignant neoplasm of breast: Secondary | ICD-10-CM

## 2020-03-11 DIAGNOSIS — N200 Calculus of kidney: Secondary | ICD-10-CM | POA: Diagnosis not present

## 2020-03-11 DIAGNOSIS — I700 Atherosclerosis of aorta: Secondary | ICD-10-CM | POA: Diagnosis not present

## 2020-03-11 DIAGNOSIS — N182 Chronic kidney disease, stage 2 (mild): Secondary | ICD-10-CM | POA: Diagnosis not present

## 2020-03-11 DIAGNOSIS — E1122 Type 2 diabetes mellitus with diabetic chronic kidney disease: Secondary | ICD-10-CM | POA: Diagnosis not present

## 2020-03-13 DIAGNOSIS — D510 Vitamin B12 deficiency anemia due to intrinsic factor deficiency: Secondary | ICD-10-CM | POA: Diagnosis not present

## 2020-03-16 ENCOUNTER — Ambulatory Visit (HOSPITAL_COMMUNITY)
Admission: RE | Admit: 2020-03-16 | Discharge: 2020-03-16 | Disposition: A | Discharge status: Home / Self Care | Payer: PPO | Source: Ambulatory Visit | Attending: Family Medicine | Admitting: Family Medicine

## 2020-03-16 ENCOUNTER — Other Ambulatory Visit: Payer: Self-pay

## 2020-03-16 DIAGNOSIS — Z1231 Encounter for screening mammogram for malignant neoplasm of breast: Secondary | ICD-10-CM | POA: Insufficient documentation

## 2020-04-08 IMAGING — DX ABDOMEN - 1 VIEW
2 series · 2 of 2 positions shown · non-contrast
Comparison: 11/16/2018.  Ultrasound 09/21/2018.  CT 08/29/2016

CLINICAL DATA: Tenderness left-sided abdomen.  Lithotripsy.

EXAM:
ABDOMEN - 1 VIEW

[abdomen kub (1 of 2)]
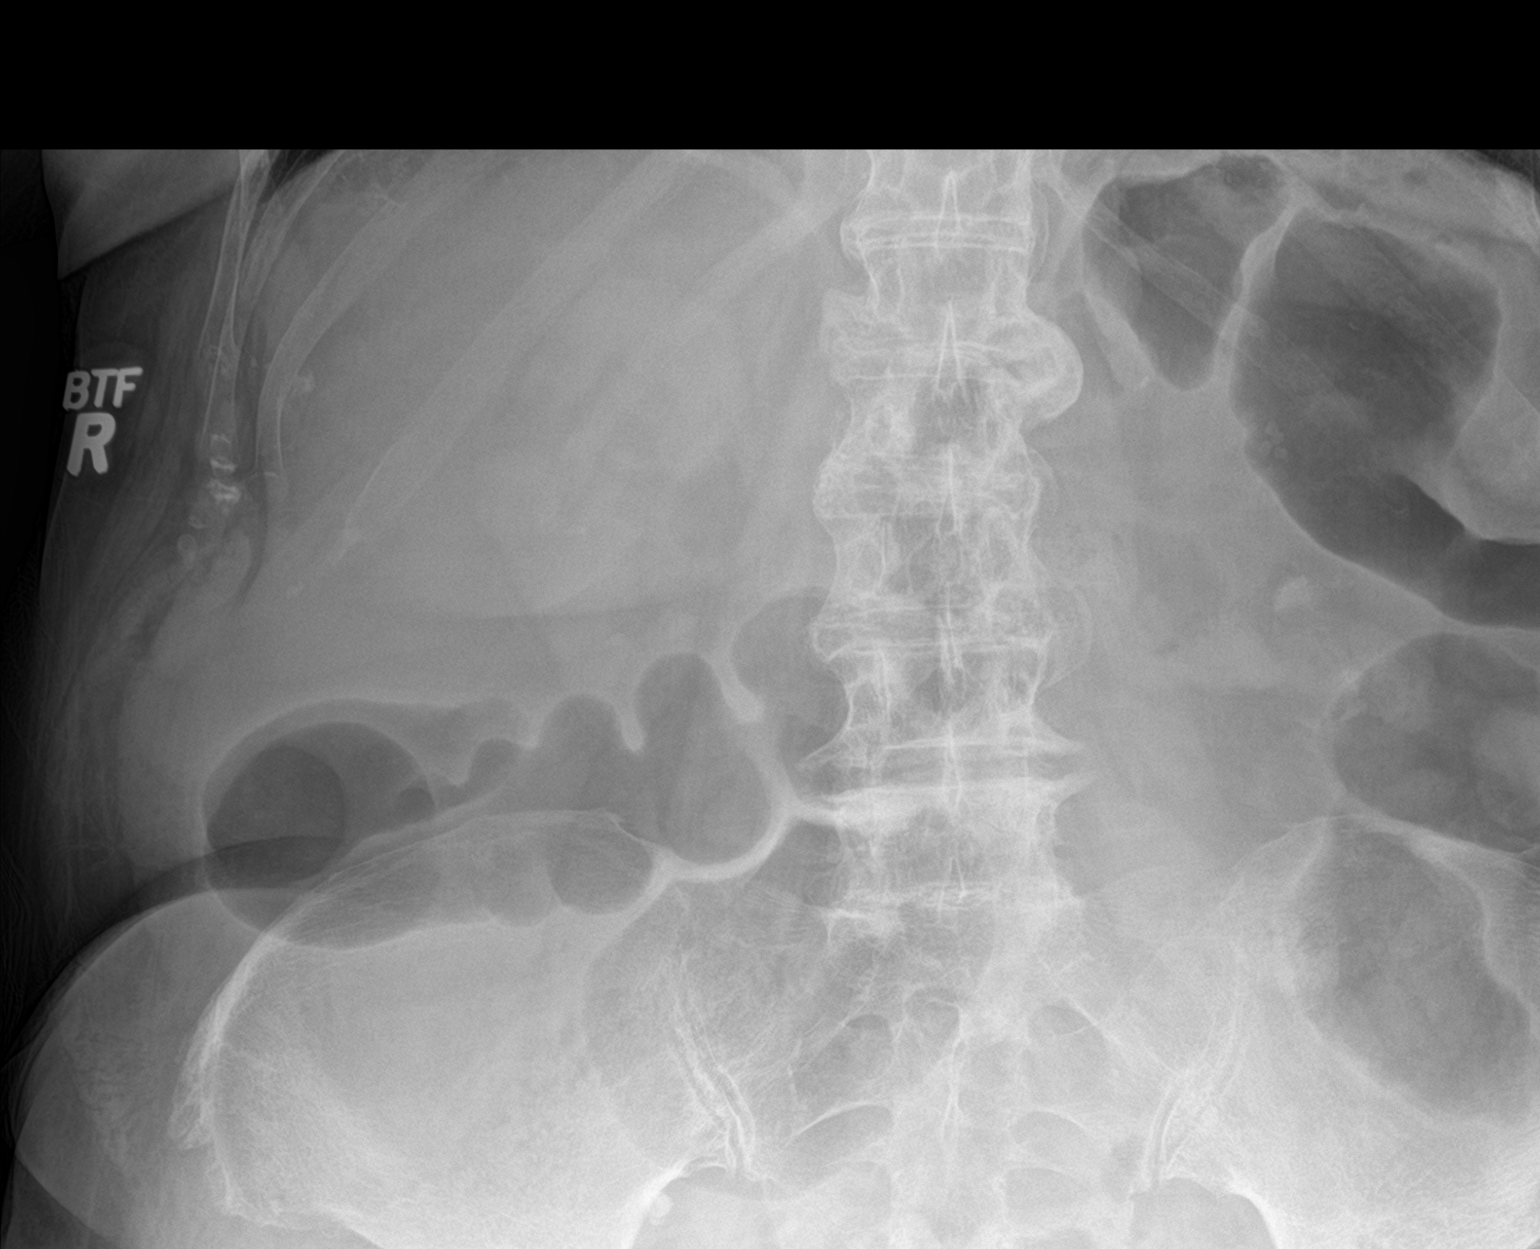

[abdomen kub (2 of 2)]
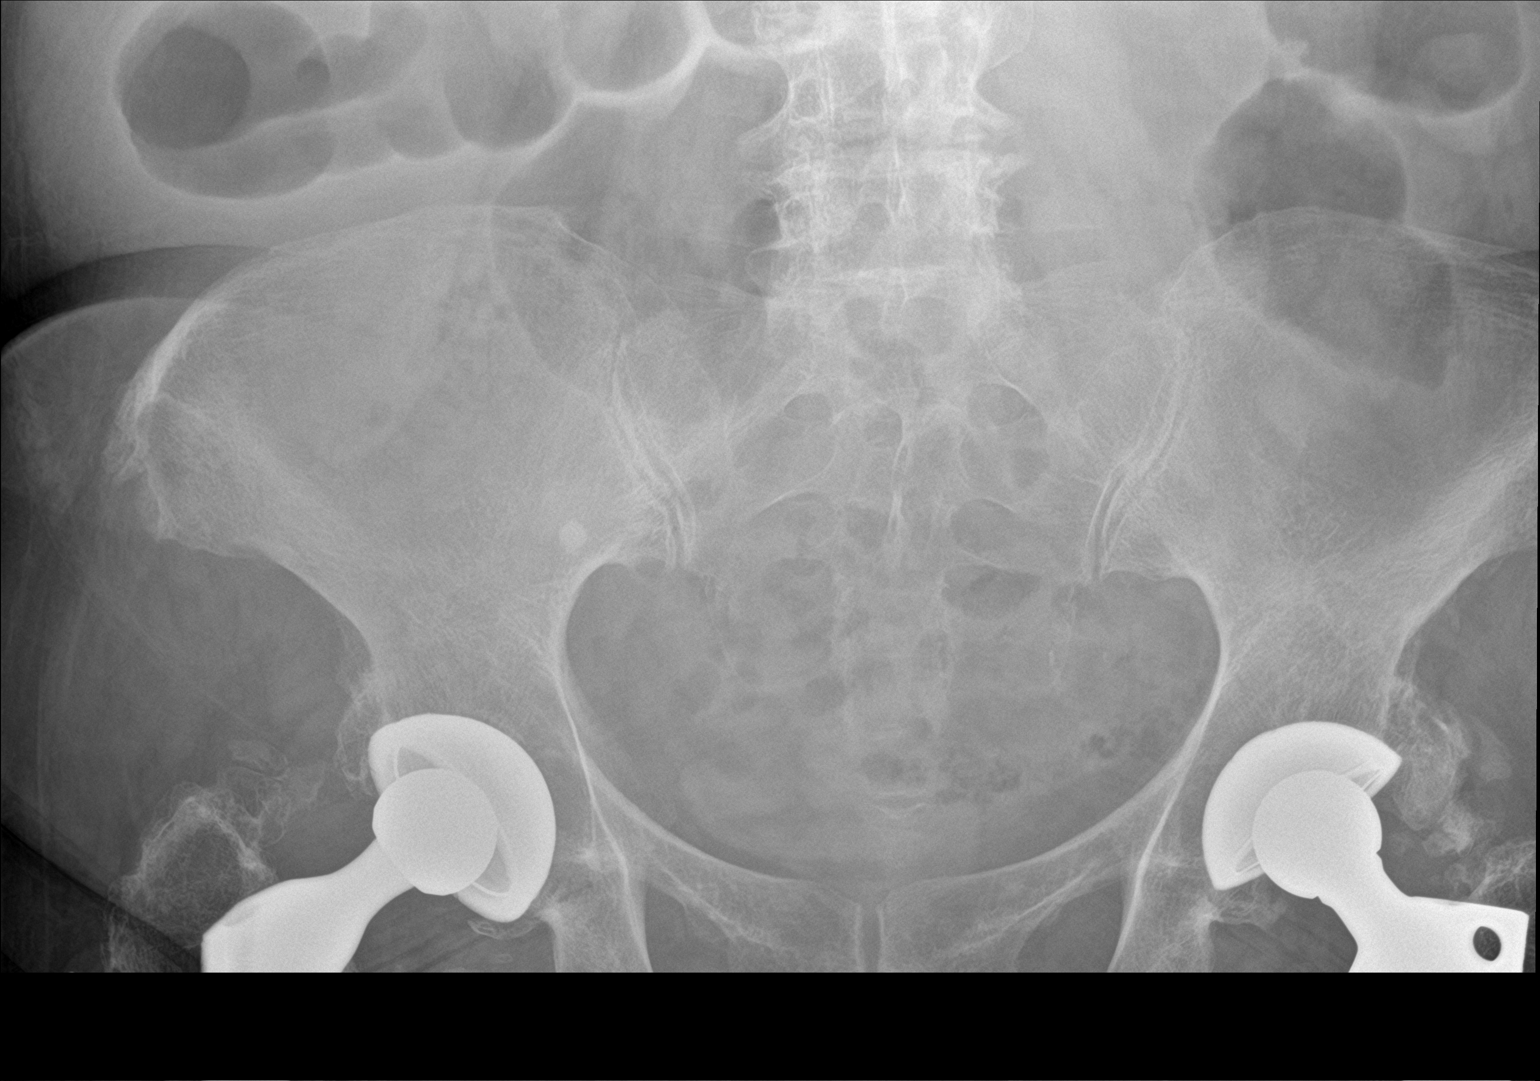

[2 of 2 positions shown; findings below may reference images not displayed]

FINDINGS: Left nephrolithiasis again noted. Similar findings noted on prior
exams. Calcific density noted over the right pelvis. This is
consistent with a phlebolith. No bowel distention or free air.
Bilateral hip replacements.
IMPRESSION: Stable left nephrolithiasis.

## 2020-04-11 DIAGNOSIS — I700 Atherosclerosis of aorta: Secondary | ICD-10-CM | POA: Diagnosis not present

## 2020-04-11 DIAGNOSIS — N182 Chronic kidney disease, stage 2 (mild): Secondary | ICD-10-CM | POA: Diagnosis not present

## 2020-04-11 DIAGNOSIS — I129 Hypertensive chronic kidney disease with stage 1 through stage 4 chronic kidney disease, or unspecified chronic kidney disease: Secondary | ICD-10-CM | POA: Diagnosis not present

## 2020-04-11 DIAGNOSIS — E1122 Type 2 diabetes mellitus with diabetic chronic kidney disease: Secondary | ICD-10-CM | POA: Diagnosis not present

## 2020-04-13 DIAGNOSIS — E538 Deficiency of other specified B group vitamins: Secondary | ICD-10-CM | POA: Diagnosis not present

## 2020-05-12 DIAGNOSIS — I129 Hypertensive chronic kidney disease with stage 1 through stage 4 chronic kidney disease, or unspecified chronic kidney disease: Secondary | ICD-10-CM | POA: Diagnosis not present

## 2020-05-12 DIAGNOSIS — E1122 Type 2 diabetes mellitus with diabetic chronic kidney disease: Secondary | ICD-10-CM | POA: Diagnosis not present

## 2020-05-12 DIAGNOSIS — N182 Chronic kidney disease, stage 2 (mild): Secondary | ICD-10-CM | POA: Diagnosis not present

## 2020-05-12 DIAGNOSIS — N200 Calculus of kidney: Secondary | ICD-10-CM | POA: Diagnosis not present

## 2020-05-17 DIAGNOSIS — D510 Vitamin B12 deficiency anemia due to intrinsic factor deficiency: Secondary | ICD-10-CM | POA: Diagnosis not present

## 2020-06-01 ENCOUNTER — Ambulatory Visit: Payer: PPO | Admitting: Urology

## 2020-06-02 ENCOUNTER — Ambulatory Visit: Payer: PPO | Admitting: Urology

## 2020-06-05 ENCOUNTER — Other Ambulatory Visit: Payer: Self-pay

## 2020-06-05 ENCOUNTER — Ambulatory Visit (HOSPITAL_COMMUNITY)
Admission: RE | Admit: 2020-06-05 | Discharge: 2020-06-05 | Disposition: A | Discharge status: Home / Self Care | Payer: PPO | Source: Ambulatory Visit | Attending: Urology | Admitting: Urology

## 2020-06-05 DIAGNOSIS — N200 Calculus of kidney: Secondary | ICD-10-CM | POA: Diagnosis not present

## 2020-06-05 DIAGNOSIS — N2889 Other specified disorders of kidney and ureter: Secondary | ICD-10-CM | POA: Diagnosis not present

## 2020-06-06 NOTE — Progress Notes (Signed)
Subjective:  Gwendolyn Snyder returns today in f/u from a second ESWL on 01/21/19 for her recurrent left renal stones.  The initial ESWL was in 7/20.  I reviewed the films and report from her recent CT with her.  There is significant residual stone burden.   She has had no flank pain or hematuria.   A KUB prior to this visit shows stable bilateral renal stones, left > right, and no ureteral stone.  UA is clear.    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 She remains on allopurinol and potassium citrate. Her stones were 80% UA and 20% CaOx.   She had chemistries in 7/20 and her Calcium was 10.7 and the Cr was 1.25.   She is due labs with Dr. Phillips Odor in the near future.      ROS:  ROS:  A complete review of systems was performed.  All systems are negative except for pertinent findings as noted.   ROS  Allergies  Allergen Reactions  . Aspirin Other (See Comments)    Makes ears ring and heart beat fast  . Codeine Other (See Comments)    Hard to wake up  . Crestor [Rosuvastatin Calcium]     Cant move  . Flomax [Tamsulosin Hcl] Nausea And Vomiting  . Pravastatin     Myalgias     Outpatient Encounter Medications as of 06/08/2020  Medication Sig  . acetaminophen (TYLENOL) 650 MG CR tablet Take 1,300 mg by mouth 2 (two) times daily.  Marland Kitchen amLODipine (NORVASC) 5 MG tablet Take 5 mg by mouth daily.   . cholecalciferol (VITAMIN D) 1000 units tablet Take 1,000 Units by mouth daily.  . Cyanocobalamin (B-12 COMPLIANCE INJECTION IJ) Inject 1 Dose as directed every 30 (thirty) days.  Marland Kitchen ezetimibe (ZETIA) 10 MG tablet Take 10 mg by mouth daily.  . Inositol Niacinate 500 MG CAPS Take 500 mg by mouth 2 (two) times daily.   Marland Kitchen latanoprost (XALATAN) 0.005 % ophthalmic solution Place 1 drop into both eyes at bedtime.  Marland Kitchen lisinopril (PRINIVIL,ZESTRIL) 20 MG tablet Take 20 mg by mouth 2 (two) times daily.   Marland Kitchen omega-3 acid ethyl esters (LOVAZA) 1 g capsule Take 1 g by mouth 2 (two) times  daily.   . ondansetron (ZOFRAN) 4 MG tablet Take 4 mg by mouth every 8 (eight) hours as needed for nausea or vomiting.  Marland Kitchen oxyCODONE-acetaminophen (PERCOCET/ROXICET) 5-325 MG tablet Take 1-2 tablets by mouth every 4 (four) hours as needed for severe pain.  . pravastatin (PRAVACHOL) 40 MG tablet Take 40 mg by mouth at bedtime.  . prednisoLONE acetate (PRED FORTE) 1 % ophthalmic suspension Place into the right eye.  . sitaGLIPtin (JANUVIA) 100 MG tablet Take 100 mg by mouth every other day.   . [DISCONTINUED] allopurinol (ZYLOPRIM) 300 MG tablet Take 1 tablet (300 mg total) by mouth every evening.  . [DISCONTINUED] Potassium Citrate 15 MEQ (1620 MG) TBCR Take 2 tabs qam and 1 tab qpm po  . allopurinol (ZYLOPRIM) 300 MG tablet Take 1 tablet (300 mg total) by mouth every evening.  . Potassium Citrate 15 MEQ (1620 MG) TBCR Take 2 tabs qam and 1 tab qpm po   No facility-administered encounter medications on file as of 06/08/2020.    Past Medical History:  Diagnosis Date  . Anemia   . Anxiety   . Arthritis   . Bilateral carpal tunnel syndrome    right hand- surgery , left hand  has not and left hand goes to sleep   . Cataracts, bilateral   . Complication of anesthesia    once after surgery felt like she could not breathe- 11/09/15- surgery greater than 11 years ago  . COPD (chronic obstructive pulmonary disease) (HCC)   . Diabetes mellitus without complication (HCC)    diet controlled , type II   . Family history of adverse reaction to anesthesia    sister- confused  . Glaucoma   . History of hiatal hernia   . History of kidney stones   . Hypertension   . Shortness of breath dyspnea    with exertion    Past Surgical History:  Procedure Laterality Date  . ABDOMINAL HYSTERECTOMY    . CARPAL TUNNEL RELEASE Right   . COLONOSCOPY    . COLONOSCOPY N/A 01/05/2019   Procedure: COLONOSCOPY;  Surgeon: Franky Macho, MD;  Location: [facility];  Service: Gastroenterology;  Laterality: N/A;  .  CYSTOSCOPY/RETROGRADE/URETEROSCOPY/STONE EXTRACTION WITH BASKET Left 09/17/2016   Procedure: CYSTOSCOPY/RETROGRADE/URETEROSCOPY/STONE EXTRACTION WITH BASKET/ STENT REMOVAL;  Surgeon: Gwendolyn Pippin, MD;  Location: [facility];  Service: Urology;  Laterality: Left;  . EXTRACORPOREAL SHOCK WAVE LITHOTRIPSY Left 11/16/2018   Procedure: EXTRACORPOREAL SHOCK WAVE LITHOTRIPSY (ESWL);  Surgeon: Crista Elliot, MD;  Location: [facility];  Service: Urology;  Laterality: Left;  . EXTRACORPOREAL SHOCK WAVE LITHOTRIPSY Left 01/21/2019   Procedure: EXTRACORPOREAL SHOCK WAVE LITHOTRIPSY (ESWL);  Surgeon: Gwendolyn Pippin, MD;  Location: [facility];  Service: Urology;  Laterality: Left;  . IR URETERAL STENT LEFT NEW ACCESS W/O SEP NEPHROSTOMY CATH  08/20/2016  . JOINT REPLACEMENT    . NEPHROLITHOTOMY Left 08/20/2016   Procedure: LEFT NEPHROLITHOTOMY PERCUTANEOUS FIRST STAGE;  Surgeon: Gwendolyn Pippin, MD;  Location: [facility];  Service: Urology;  Laterality: Left;  . NEPHROLITHOTOMY Left 08/22/2016   Procedure: LEFT NEPHROLITHOTOMY PERCUTANEOUS SECOND LOOK;  Surgeon: Gwendolyn Pippin, MD;  Location: [facility];  Service: Urology;  Laterality: Left;  . Removal kidney stone    . TOTAL HIP ARTHROPLASTY Bilateral   . TOTAL HIP REVISION Left 11/22/2015   Procedure: Revision Left Total Hip Arthroplasty, Poly and Newman Pies Exchange;  Surgeon: Eldred Manges, MD;  Location[facility] OR;  Service: Orthopedics;  Laterality: Left;  . TOTAL KNEE ARTHROPLASTY Right     Social History   Socioeconomic History  . Marital status: Single    Spouse name: Not on file  . Number of children: Not on file  . Years of education: Not on file  . Highest education level: Not on file  Occupational History  . Not on file  Tobacco Use  . Smoking status: Former Smoker    Packs/day: 1.00    Years: 30.00    Pack years: 30.00    Types: Cigarettes    Quit date: 05/13/2004    Years since quitting: 16.0  . Smokeless tobacco: Never Used  Vaping Use  . Vaping Use: Never used  Substance  and Sexual Activity  . Alcohol use: No    Alcohol/week: 0.0 standard drinks  . Drug use: No  . Sexual activity: Not on file  Other Topics Concern  . Not on file  Social History Narrative  . Not on file   Social Determinants of Health   Financial Resource Strain: Not on file  Food Insecurity: Not on file  Transportation Needs: Not on file  Physical Activity: Not on file  Stress: Not on file  Social Connections: Not on file  Intimate Partner Violence: Not  on file    No family history on file.     Objective: Vitals:   06/08/20 1103  BP: 134/72  Pulse: 76  Temp: 98 F (36.7 C)     Physical Exam  Lab Results:  Results for orders placed or performed in visit on 06/08/20 (from the past 24 hour(s))  Urinalysis, Routine w reflex microscopic     Status: Abnormal   Collection Time: 06/08/20 10:58 AM  Result Value Ref Range   Specific Gravity, UA 1.020 1.005 - 1.030   pH, UA 5.0 5.0 - 7.5   Color, UA Yellow Yellow   Appearance Ur Clear Clear   Leukocytes,UA Trace (A) Negative   Protein,UA Negative Negative/Trace   Glucose, UA Negative Negative   Ketones, UA Negative Negative   RBC, UA Trace (A) Negative   Bilirubin, UA Negative Negative   Urobilinogen, Ur 0.2 0.2 - 1.0 mg/dL   Nitrite, UA Negative Negative   Microscopic Examination See below:    Narrative   Performed at:  [address] - [facility] [address], [place], [place]  [id number] Lab Director: Chinita Pester MT, Phone:  [phone]  Microscopic Examination     Status: Abnormal   Collection Time: 06/08/20 10:58 AM   Urine  Result Value Ref Range   WBC, UA 0-5 0 - 5 /hpf   RBC 0-2 0 - 2 /hpf   Epithelial Cells (non renal) >10 (A) 0 - 10 /hpf   Renal Epithel, UA None seen None seen /hpf   Bacteria, UA Many (A) None seen/Few   Narrative   Performed at:  [address] - [facility] [address], [place], [place]  [id number] Lab Director: Chinita Pester MT, Phone:  [phone]    BMET No results  for input(s): NA, K, CL, CO2, GLUCOSE, BUN, CREATININE, CALCIUM in the last 72 hours. PSA No results found for: PSA No results found for: TESTOSTERONE  UA is unremarkable.   Studies/Results: DG Abd 1 View  Result Date: 06/05/2020 CLINICAL DATA:  nephrolithiasis EXAM: ABDOMEN - 1 VIEW COMPARISON:  11/22/2019 and prior. FINDINGS: Nonobstructive bowel gas pattern. Stable appearance of calcific densities overlying the right renal shadow. Left renal shadow is partially obscured by bowel gas. Decreased conspicuity of stone burden overlying the left renal shadow. Multilevel spondylosis and bilateral hip arthroplasties. IMPRESSION: Stable appearance of right nephrolithiasis. Decreased conspicuity of left renal stone burden however obscuration by overlying bowel gas limits evaluation. Electronically Signed   By: Stana Bunting M.D.   On: 06/05/2020 12:57     Assessment & Plan: Bilateral renal stones with a stable stone burden left > right.  KUB in a year.  Meds refilled.   History of hypercalcemia.  I will get labs today and prior to f/u.      Meds ordered this encounter  Medications  . Potassium Citrate 15 MEQ (1620 MG) TBCR    Sig: Take 2 tabs qam and 1 tab qpm po    Dispense:  270 tablet    Refill:  3  . allopurinol (ZYLOPRIM) 300 MG tablet    Sig: Take 1 tablet (300 mg total) by mouth every evening.    Dispense:  90 tablet    Refill:  3     Orders Placed This Encounter  Procedures  . Microscopic Examination  . DG Abd 1 View    Standing Status:   Future    Standing Expiration Date:   06/08/2021    Order Specific Question:   Reason for  Exam (SYMPTOM  OR DIAGNOSIS REQUIRED)    Answer:   renal stones    Order Specific Question:   Preferred imaging location?    Answer:   [facility]    Order Specific Question:   Radiology Contrast Protocol - do NOT remove file path    Answer:   [email or web address]  . Urinalysis, Routine  w reflex microscopic  . Basic metabolic panel  . Uric acid  . Basic metabolic panel    Standing Status:   Future    Standing Expiration Date:   06/08/2021  . Uric acid    Standing Status:   Future    Standing Expiration Date:   06/08/2021  . CBC    Standing Status:   Future    Number of Occurrences:   1    Standing Expiration Date:   06/08/2021  . CBC      Return in about 1 year (around 06/08/2021) for with labs and KUB.    CC: Assunta FoundGolding, Cailie Bosshart, MD      Gwendolyn PippinJohn Ebone Snyder 06/08/2020

## 2020-06-08 ENCOUNTER — Other Ambulatory Visit: Payer: Self-pay

## 2020-06-08 ENCOUNTER — Ambulatory Visit (INDEPENDENT_AMBULATORY_CARE_PROVIDER_SITE_OTHER): Payer: PPO | Admitting: Urology

## 2020-06-08 VITALS — BP 134/72 | HR 76 | Temp 98.0°F | Ht 63.0 in | Wt 180.0 lb

## 2020-06-08 DIAGNOSIS — N200 Calculus of kidney: Secondary | ICD-10-CM

## 2020-06-08 LAB — URINALYSIS, ROUTINE W REFLEX MICROSCOPIC
Bilirubin, UA: NEGATIVE
Glucose, UA: NEGATIVE
Ketones, UA: NEGATIVE
Nitrite, UA: NEGATIVE
Protein,UA: NEGATIVE
Specific Gravity, UA: 1.02 (ref 1.005–1.030)
Urobilinogen, Ur: 0.2 mg/dL (ref 0.2–1.0)
pH, UA: 5 (ref 5.0–7.5)

## 2020-06-08 LAB — MICROSCOPIC EXAMINATION
Epithelial Cells (non renal): >10 /hpf — AB (ref 0–10)
Renal Epithel, UA: NONE SEEN /hpf

## 2020-06-08 MED ORDER — ALLOPURINOL 300 MG PO TABS: 300.0000 mg | ORAL_TABLET | Freq: Every evening | ORAL | 3 refills | Status: DC

## 2020-06-08 MED ORDER — POTASSIUM CITRATE ER 15 MEQ (1620 MG) PO TBCR
EXTENDED_RELEASE_TABLET | ORAL | 3 refills | Status: DC
Start: 2020-06-08 — End: 2022-12-12

## 2020-06-08 NOTE — Progress Notes (Signed)
Urological Symptom Review  Patient is experiencing the following symptoms: none   Review of Systems  Gastrointestinal (upper)  : Negative for upper GI symptoms  Gastrointestinal (lower) : Negative for lower GI symptoms  Constitutional : Negative for symptoms  Skin: Negative for skin symptoms  Eyes: Negative for eye symptoms  Ear/Nose/Throat : Negative for Ear/Nose/Throat symptoms  Hematologic/Lymphatic: Bruise easily  Cardiovascular : Negative for cardiovascular symptoms  Respiratory : Shortness of breath  Endocrine: Negative for endocrine symptoms  Musculoskeletal: Joint pain  Neurological: Negative for neurological symptoms  Psychologic: Anxiety

## 2020-06-09 ENCOUNTER — Telehealth: Payer: Self-pay

## 2020-06-09 ENCOUNTER — Other Ambulatory Visit: Payer: Self-pay

## 2020-06-09 DIAGNOSIS — N200 Calculus of kidney: Secondary | ICD-10-CM

## 2020-06-09 LAB — CBC
Hematocrit: 34 % (ref 34.0–46.6)
Hemoglobin: 11.2 g/dL (ref 11.1–15.9)
MCH: 29.9 pg (ref 26.6–33.0)
MCHC: 32.9 g/dL (ref 31.5–35.7)
MCV: 91 fL (ref 79–97)
Platelets: 265 10*3/uL (ref 150–450)
RBC: 3.74 x10E6/uL — ABNORMAL LOW (ref 3.77–5.28)
RDW: 12.3 % (ref 11.7–15.4)
WBC: 9.7 10*3/uL (ref 3.4–10.8)

## 2020-06-09 LAB — BASIC METABOLIC PANEL
BUN/Creatinine Ratio: 24 (ref 12–28)
BUN: 43 mg/dL — ABNORMAL HIGH (ref 8–27)
CO2: 19 mmol/L — ABNORMAL LOW (ref 20–29)
Calcium: 10.2 mg/dL (ref 8.7–10.3)
Chloride: 107 mmol/L — ABNORMAL HIGH (ref 96–106)
Creatinine, Ser: 1.81 mg/dL — ABNORMAL HIGH (ref 0.57–1.00)
GFR calc Af Amer: 32 mL/min/{1.73_m2} — ABNORMAL LOW (ref >59)
GFR calc non Af Amer: 28 mL/min/{1.73_m2} — ABNORMAL LOW (ref >59)
Glucose: 101 mg/dL — ABNORMAL HIGH (ref 65–99)
Potassium: 5.6 mmol/L — ABNORMAL HIGH (ref 3.5–5.2)
Sodium: 142 mmol/L (ref 134–144)

## 2020-06-09 LAB — URIC ACID: Uric Acid: 4.3 mg/dL (ref 3.0–7.2)

## 2020-06-09 NOTE — Telephone Encounter (Signed)
Pt made aware of all blood work results and that he wants a Renal Ultrasound done also per Dr. Annabell Howells note. Pt notified to cut back Potassium pill to one per day. Order placed for Renal ultasound.  Told her scheduling would call. Note forwarded to scheduler.

## 2020-06-09 NOTE — Progress Notes (Signed)
Her Cr is up to 1.81 with a Potassium of 5.6 both of which are up from her prior level.   She will need to cut the potassium citrate back to 1 x daily for now.  Her CBC and uric acid levels are ok.   Please send these results to Dr. Phillips Odor.   She may need to see nephrology, but with her history of stones, I would like to have her go for a renal US to make sure there is not obstruction.   Please set that up and if that is negative, I will defer a nephrology referral to Dr. Phillips Odor, please let him know that.

## 2020-06-09 NOTE — Telephone Encounter (Signed)
-----   Message from Bjorn Pippin, MD sent at 06/09/2020 10:30 AM EST ----- Her Cr is up to 1.81 with a Potassium of 5.6 both of which are up from her prior level.   She will need to cut the potassium citrate back to 1 x daily for now.  Her CBC and uric acid levels are ok.   Please send these results to Dr. Phillips Odor.   She may need to see nephrology, but with her history of stones, I would like to have her go for a renal US to make sure there is not obstruction.   Please set that up and if that is negative, I will defer a nephrology referral to Dr. Phillips Odor, please let him know that.

## 2020-06-10 DIAGNOSIS — I129 Hypertensive chronic kidney disease with stage 1 through stage 4 chronic kidney disease, or unspecified chronic kidney disease: Secondary | ICD-10-CM | POA: Diagnosis not present

## 2020-06-10 DIAGNOSIS — E1122 Type 2 diabetes mellitus with diabetic chronic kidney disease: Secondary | ICD-10-CM | POA: Diagnosis not present

## 2020-06-10 DIAGNOSIS — N182 Chronic kidney disease, stage 2 (mild): Secondary | ICD-10-CM | POA: Diagnosis not present

## 2020-06-10 DIAGNOSIS — I700 Atherosclerosis of aorta: Secondary | ICD-10-CM | POA: Diagnosis not present

## 2020-06-13 DIAGNOSIS — D510 Vitamin B12 deficiency anemia due to intrinsic factor deficiency: Secondary | ICD-10-CM | POA: Diagnosis not present

## 2020-06-16 DIAGNOSIS — E118 Type 2 diabetes mellitus with unspecified complications: Secondary | ICD-10-CM | POA: Diagnosis not present

## 2020-06-16 DIAGNOSIS — Z1331 Encounter for screening for depression: Secondary | ICD-10-CM | POA: Diagnosis not present

## 2020-06-16 DIAGNOSIS — I10 Essential (primary) hypertension: Secondary | ICD-10-CM | POA: Diagnosis not present

## 2020-06-16 DIAGNOSIS — N200 Calculus of kidney: Secondary | ICD-10-CM | POA: Diagnosis not present

## 2020-06-16 DIAGNOSIS — E1165 Type 2 diabetes mellitus with hyperglycemia: Secondary | ICD-10-CM | POA: Diagnosis not present

## 2020-06-16 DIAGNOSIS — Z0001 Encounter for general adult medical examination with abnormal findings: Secondary | ICD-10-CM | POA: Diagnosis not present

## 2020-06-16 DIAGNOSIS — E7849 Other hyperlipidemia: Secondary | ICD-10-CM | POA: Diagnosis not present

## 2020-06-16 DIAGNOSIS — Z6832 Body mass index (BMI) 32.0-32.9, adult: Secondary | ICD-10-CM | POA: Diagnosis not present

## 2020-06-17 LAB — CBC
Hematocrit: 34.8 % (ref 34.0–46.6)
Hemoglobin: 11.6 g/dL (ref 11.1–15.9)
MCH: 30.3 pg (ref 26.6–33.0)
MCHC: 33.3 g/dL (ref 31.5–35.7)
MCV: 91 fL (ref 79–97)
Platelets: 313 10*3/uL (ref 150–450)
RBC: 3.83 x10E6/uL (ref 3.77–5.28)
RDW: 12.6 % (ref 11.7–15.4)
WBC: 8.7 10*3/uL (ref 3.4–10.8)

## 2020-06-20 ENCOUNTER — Ambulatory Visit (HOSPITAL_COMMUNITY)
Admission: RE | Admit: 2020-06-20 | Discharge: 2020-06-20 | Disposition: A | Discharge status: Home / Self Care | Payer: PPO | Source: Ambulatory Visit | Attending: Urology | Admitting: Urology

## 2020-06-20 ENCOUNTER — Other Ambulatory Visit: Payer: Self-pay

## 2020-06-20 DIAGNOSIS — N200 Calculus of kidney: Secondary | ICD-10-CM | POA: Diagnosis not present

## 2020-06-20 DIAGNOSIS — N281 Cyst of kidney, acquired: Secondary | ICD-10-CM | POA: Diagnosis not present

## 2020-06-20 DIAGNOSIS — N261 Atrophy of kidney (terminal): Secondary | ICD-10-CM | POA: Diagnosis not present

## 2020-06-23 NOTE — Progress Notes (Signed)
There are bilateral renal stones as expected but no obstruction to explain the rise in Cr.   There is some progressive renal atrophy so I think she probably should see nephrology but I will defer that decision to Dr. Phillips Odor.   Please send him a copy of the report with my concerns that could benefit from a nephrology consult.

## 2020-06-26 NOTE — Progress Notes (Signed)
Pt made aware of note from Dr. Annabell Howells. Report also faxed to Dr. Phillips Odor.

## 2020-07-03 DIAGNOSIS — H401111 Primary open-angle glaucoma, right eye, mild stage: Secondary | ICD-10-CM | POA: Diagnosis not present

## 2020-07-03 DIAGNOSIS — H1033 Unspecified acute conjunctivitis, bilateral: Secondary | ICD-10-CM | POA: Diagnosis not present

## 2020-07-04 NOTE — Progress Notes (Signed)
Sent to PCP ?

## 2020-07-10 DIAGNOSIS — N182 Chronic kidney disease, stage 2 (mild): Secondary | ICD-10-CM | POA: Diagnosis not present

## 2020-07-10 DIAGNOSIS — E7849 Other hyperlipidemia: Secondary | ICD-10-CM | POA: Diagnosis not present

## 2020-07-10 DIAGNOSIS — N200 Calculus of kidney: Secondary | ICD-10-CM | POA: Diagnosis not present

## 2020-07-10 DIAGNOSIS — I129 Hypertensive chronic kidney disease with stage 1 through stage 4 chronic kidney disease, or unspecified chronic kidney disease: Secondary | ICD-10-CM | POA: Diagnosis not present

## 2020-07-10 DIAGNOSIS — E1122 Type 2 diabetes mellitus with diabetic chronic kidney disease: Secondary | ICD-10-CM | POA: Diagnosis not present

## 2020-07-10 DIAGNOSIS — I700 Atherosclerosis of aorta: Secondary | ICD-10-CM | POA: Diagnosis not present

## 2020-07-11 DIAGNOSIS — E1129 Type 2 diabetes mellitus with other diabetic kidney complication: Secondary | ICD-10-CM | POA: Diagnosis not present

## 2020-07-11 DIAGNOSIS — J449 Chronic obstructive pulmonary disease, unspecified: Secondary | ICD-10-CM | POA: Diagnosis not present

## 2020-07-11 DIAGNOSIS — E7849 Other hyperlipidemia: Secondary | ICD-10-CM | POA: Diagnosis not present

## 2020-07-11 DIAGNOSIS — Z0001 Encounter for general adult medical examination with abnormal findings: Secondary | ICD-10-CM | POA: Diagnosis not present

## 2020-07-11 DIAGNOSIS — Z1389 Encounter for screening for other disorder: Secondary | ICD-10-CM | POA: Diagnosis not present

## 2020-07-11 DIAGNOSIS — D510 Vitamin B12 deficiency anemia due to intrinsic factor deficiency: Secondary | ICD-10-CM | POA: Diagnosis not present

## 2020-07-27 DIAGNOSIS — E872 Acidosis: Secondary | ICD-10-CM | POA: Diagnosis not present

## 2020-07-27 DIAGNOSIS — E1122 Type 2 diabetes mellitus with diabetic chronic kidney disease: Secondary | ICD-10-CM | POA: Diagnosis not present

## 2020-07-27 DIAGNOSIS — E6609 Other obesity due to excess calories: Secondary | ICD-10-CM | POA: Diagnosis not present

## 2020-07-27 DIAGNOSIS — I129 Hypertensive chronic kidney disease with stage 1 through stage 4 chronic kidney disease, or unspecified chronic kidney disease: Secondary | ICD-10-CM | POA: Diagnosis not present

## 2020-07-27 DIAGNOSIS — N200 Calculus of kidney: Secondary | ICD-10-CM | POA: Diagnosis not present

## 2020-07-27 DIAGNOSIS — N189 Chronic kidney disease, unspecified: Secondary | ICD-10-CM | POA: Diagnosis not present

## 2020-07-27 DIAGNOSIS — N281 Cyst of kidney, acquired: Secondary | ICD-10-CM | POA: Diagnosis not present

## 2020-07-27 DIAGNOSIS — Z79899 Other long term (current) drug therapy: Secondary | ICD-10-CM | POA: Diagnosis not present

## 2020-07-27 DIAGNOSIS — E875 Hyperkalemia: Secondary | ICD-10-CM | POA: Diagnosis not present

## 2020-07-31 DIAGNOSIS — E872 Acidosis: Secondary | ICD-10-CM | POA: Diagnosis not present

## 2020-07-31 DIAGNOSIS — N189 Chronic kidney disease, unspecified: Secondary | ICD-10-CM | POA: Diagnosis not present

## 2020-07-31 DIAGNOSIS — I129 Hypertensive chronic kidney disease with stage 1 through stage 4 chronic kidney disease, or unspecified chronic kidney disease: Secondary | ICD-10-CM | POA: Diagnosis not present

## 2020-07-31 DIAGNOSIS — E1122 Type 2 diabetes mellitus with diabetic chronic kidney disease: Secondary | ICD-10-CM | POA: Diagnosis not present

## 2020-07-31 DIAGNOSIS — N200 Calculus of kidney: Secondary | ICD-10-CM | POA: Diagnosis not present

## 2020-07-31 DIAGNOSIS — E875 Hyperkalemia: Secondary | ICD-10-CM | POA: Diagnosis not present

## 2020-07-31 DIAGNOSIS — E6609 Other obesity due to excess calories: Secondary | ICD-10-CM | POA: Diagnosis not present

## 2020-07-31 DIAGNOSIS — Z79899 Other long term (current) drug therapy: Secondary | ICD-10-CM | POA: Diagnosis not present

## 2020-08-09 DIAGNOSIS — E7849 Other hyperlipidemia: Secondary | ICD-10-CM | POA: Diagnosis not present

## 2020-08-09 DIAGNOSIS — N182 Chronic kidney disease, stage 2 (mild): Secondary | ICD-10-CM | POA: Diagnosis not present

## 2020-08-09 DIAGNOSIS — E1122 Type 2 diabetes mellitus with diabetic chronic kidney disease: Secondary | ICD-10-CM | POA: Diagnosis not present

## 2020-08-11 DIAGNOSIS — D510 Vitamin B12 deficiency anemia due to intrinsic factor deficiency: Secondary | ICD-10-CM | POA: Diagnosis not present

## 2020-08-25 DIAGNOSIS — D508 Other iron deficiency anemias: Secondary | ICD-10-CM | POA: Diagnosis not present

## 2020-08-25 DIAGNOSIS — N189 Chronic kidney disease, unspecified: Secondary | ICD-10-CM | POA: Diagnosis not present

## 2020-08-25 DIAGNOSIS — E876 Hypokalemia: Secondary | ICD-10-CM | POA: Diagnosis not present

## 2020-08-25 DIAGNOSIS — E1129 Type 2 diabetes mellitus with other diabetic kidney complication: Secondary | ICD-10-CM | POA: Diagnosis not present

## 2020-08-25 DIAGNOSIS — I129 Hypertensive chronic kidney disease with stage 1 through stage 4 chronic kidney disease, or unspecified chronic kidney disease: Secondary | ICD-10-CM | POA: Diagnosis not present

## 2020-08-25 DIAGNOSIS — E1122 Type 2 diabetes mellitus with diabetic chronic kidney disease: Secondary | ICD-10-CM | POA: Diagnosis not present

## 2020-08-25 DIAGNOSIS — N200 Calculus of kidney: Secondary | ICD-10-CM | POA: Diagnosis not present

## 2020-08-25 DIAGNOSIS — E6609 Other obesity due to excess calories: Secondary | ICD-10-CM | POA: Diagnosis not present

## 2020-08-25 DIAGNOSIS — R809 Proteinuria, unspecified: Secondary | ICD-10-CM | POA: Diagnosis not present

## 2020-08-27 ENCOUNTER — Emergency Department (HOSPITAL_COMMUNITY): Payer: PPO

## 2020-08-27 ENCOUNTER — Emergency Department (HOSPITAL_COMMUNITY)
Admission: EM | Admit: 2020-08-27 | Discharge: 2020-08-28 | Disposition: A | Discharge status: Home / Self Care | Payer: PPO | Attending: Emergency Medicine | Admitting: Emergency Medicine

## 2020-08-27 ENCOUNTER — Other Ambulatory Visit: Payer: Self-pay

## 2020-08-27 ENCOUNTER — Encounter (HOSPITAL_COMMUNITY): Payer: Self-pay | Admitting: *Deleted

## 2020-08-27 DIAGNOSIS — K449 Diaphragmatic hernia without obstruction or gangrene: Secondary | ICD-10-CM | POA: Diagnosis not present

## 2020-08-27 DIAGNOSIS — J449 Chronic obstructive pulmonary disease, unspecified: Secondary | ICD-10-CM | POA: Diagnosis not present

## 2020-08-27 DIAGNOSIS — R112 Nausea with vomiting, unspecified: Secondary | ICD-10-CM

## 2020-08-27 DIAGNOSIS — K2900 Acute gastritis without bleeding: Secondary | ICD-10-CM | POA: Diagnosis not present

## 2020-08-27 DIAGNOSIS — Z7984 Long term (current) use of oral hypoglycemic drugs: Secondary | ICD-10-CM | POA: Diagnosis not present

## 2020-08-27 DIAGNOSIS — Z87891 Personal history of nicotine dependence: Secondary | ICD-10-CM | POA: Insufficient documentation

## 2020-08-27 DIAGNOSIS — Z20822 Contact with and (suspected) exposure to covid-19: Secondary | ICD-10-CM | POA: Diagnosis not present

## 2020-08-27 DIAGNOSIS — Z96643 Presence of artificial hip joint, bilateral: Secondary | ICD-10-CM | POA: Insufficient documentation

## 2020-08-27 DIAGNOSIS — E119 Type 2 diabetes mellitus without complications: Secondary | ICD-10-CM | POA: Insufficient documentation

## 2020-08-27 DIAGNOSIS — Z79899 Other long term (current) drug therapy: Secondary | ICD-10-CM | POA: Diagnosis not present

## 2020-08-27 DIAGNOSIS — I10 Essential (primary) hypertension: Secondary | ICD-10-CM | POA: Insufficient documentation

## 2020-08-27 DIAGNOSIS — Z96651 Presence of right artificial knee joint: Secondary | ICD-10-CM | POA: Insufficient documentation

## 2020-08-27 DIAGNOSIS — N200 Calculus of kidney: Secondary | ICD-10-CM | POA: Diagnosis not present

## 2020-08-27 DIAGNOSIS — R1110 Vomiting, unspecified: Secondary | ICD-10-CM | POA: Diagnosis not present

## 2020-08-27 DIAGNOSIS — D734 Cyst of spleen: Secondary | ICD-10-CM | POA: Diagnosis not present

## 2020-08-27 HISTORY — DX: Disorder of kidney and ureter, unspecified: N28.9

## 2020-08-27 LAB — CBC WITH DIFFERENTIAL/PLATELET
Abs Immature Granulocytes: 0.06 10*3/uL (ref 0.00–0.07)
Basophils Absolute: 0 10*3/uL (ref 0.0–0.1)
Basophils Relative: 0 %
Eosinophils Absolute: 0 10*3/uL (ref 0.0–0.5)
Eosinophils Relative: 0 %
HCT: 37.6 % (ref 36.0–46.0)
Hemoglobin: 11.7 g/dL — ABNORMAL LOW (ref 12.0–15.0)
Immature Granulocytes: 1 %
Lymphocytes Relative: 9 %
Lymphs Abs: 1 10*3/uL (ref 0.7–4.0)
MCH: 29 pg (ref 26.0–34.0)
MCHC: 31.1 g/dL (ref 30.0–36.0)
MCV: 93.1 fL (ref 80.0–100.0)
Monocytes Absolute: 0.4 10*3/uL (ref 0.1–1.0)
Monocytes Relative: 4 %
Neutro Abs: 9.9 10*3/uL — ABNORMAL HIGH (ref 1.7–7.7)
Neutrophils Relative %: 86 %
Platelets: 323 10*3/uL (ref 150–400)
RBC: 4.04 MIL/uL (ref 3.87–5.11)
RDW: 13.6 % (ref 11.5–15.5)
WBC: 11.5 10*3/uL — ABNORMAL HIGH (ref 4.0–10.5)
nRBC: 0 % (ref 0.0–0.2)

## 2020-08-27 LAB — COMPREHENSIVE METABOLIC PANEL
ALT: 14 U/L (ref 0–44)
AST: 16 U/L (ref 15–41)
Albumin: 3.7 g/dL (ref 3.5–5.0)
Alkaline Phosphatase: 73 U/L (ref 38–126)
Anion gap: 11 (ref 5–15)
BUN: 33 mg/dL — ABNORMAL HIGH (ref 8–23)
CO2: 25 mmol/L (ref 22–32)
Calcium: 9.4 mg/dL (ref 8.9–10.3)
Chloride: 104 mmol/L (ref 98–111)
Creatinine, Ser: 1.38 mg/dL — ABNORMAL HIGH (ref 0.44–1.00)
GFR, Estimated: 41 mL/min — ABNORMAL LOW (ref >60)
Glucose, Bld: 253 mg/dL — ABNORMAL HIGH (ref 70–99)
Potassium: 3.1 mmol/L — ABNORMAL LOW (ref 3.5–5.1)
Sodium: 140 mmol/L (ref 135–145)
Total Bilirubin: 0.4 mg/dL (ref 0.3–1.2)
Total Protein: 7.5 g/dL (ref 6.5–8.1)

## 2020-08-27 LAB — RESP PANEL BY RT-PCR (FLU A&B, COVID) ARPGX2
Influenza A by PCR: NEGATIVE
Influenza B by PCR: NEGATIVE
SARS Coronavirus 2 by RT PCR: NEGATIVE

## 2020-08-27 LAB — LIPASE, BLOOD: Lipase: 30 U/L (ref 11–51)

## 2020-08-27 MED ORDER — FAMOTIDINE IN NACL 20-0.9 MG/50ML-% IV SOLN
20.0000 mg | Freq: Once | INTRAVENOUS | Status: AC
  Administered 2020-08-28: 20 mg via INTRAVENOUS
  Filled 2020-08-27: qty 50

## 2020-08-27 MED ORDER — SODIUM CHLORIDE 0.9 % IV BOLUS
1000.0000 mL | Freq: Once | INTRAVENOUS | Status: AC
  Administered 2020-08-27: 1000 mL via INTRAVENOUS

## 2020-08-27 MED ORDER — IOHEXOL 300 MG/ML  SOLN: 80.0000 mL | Freq: Once | INTRAMUSCULAR | Status: DC | PRN

## 2020-08-27 MED ORDER — ONDANSETRON 4 MG PO TBDP
4.0000 mg | ORAL_TABLET | Freq: Once | ORAL | Status: AC
  Administered 2020-08-27: 4 mg via ORAL
  Filled 2020-08-27: qty 1

## 2020-08-27 MED ORDER — LIDOCAINE VISCOUS HCL 2 % MT SOLN
15.0000 mL | Freq: Once | OROMUCOSAL | Status: AC
  Administered 2020-08-28: 15 mL via ORAL
  Filled 2020-08-27: qty 15

## 2020-08-27 MED ORDER — POTASSIUM CHLORIDE CRYS ER 20 MEQ PO TBCR
40.0000 meq | EXTENDED_RELEASE_TABLET | Freq: Once | ORAL | Status: AC
  Administered 2020-08-28: 40 meq via ORAL
  Filled 2020-08-27: qty 2

## 2020-08-27 MED ORDER — ALUM & MAG HYDROXIDE-SIMETH 200-200-20 MG/5ML PO SUSP
30.0000 mL | Freq: Once | ORAL | Status: AC
  Administered 2020-08-28: 30 mL via ORAL
  Filled 2020-08-27: qty 30

## 2020-08-27 NOTE — ED Triage Notes (Signed)
Pt with emesis since 1230 today, about 20-30 times per pt.  abd pain earlier, denies pain at present.

## 2020-08-27 NOTE — ED Notes (Signed)
Pt given PO fluids at this time for PO challenge. Also spoke with patient about need for a urine sample, states she cannot urinate at this time.

## 2020-08-27 NOTE — ED Provider Notes (Signed)
[facility] EMERGENCY DEPARTMENT Provider Note   CSN: [id number] Arrival date & time: 08/27/20  1928     History Chief Complaint  Patient presents with  . Emesis    Gwendolyn Snyder is a 70 y.o. female.  HPI 70 year old female with a history of DM type II, COPD, hypertension, renal disorder presents to the ER with complaints of abdominal pain and vomiting.  Patient states she has had about 15-20 episodes of nonbloody nonbilious vomiting throughout the day.  Has had very poor p.o. intake.  Denies any fevers or chills.  No chest pain or shortness of breath.  Does not know exactly what the precipitating cause of this is.  Denies any back pain.  Denies any dysuria or hematuria.  No known fevers.    Past Medical History:  Diagnosis Date  . Anemia   . Anxiety   . Arthritis   . Bilateral carpal tunnel syndrome    right hand- surgery , left hand has not and left hand goes to sleep   . Cataracts, bilateral   . Complication of anesthesia    once after surgery felt like she could not breathe- 11/09/15- surgery greater than 11 years ago  . COPD (chronic obstructive pulmonary disease) (HCC)   . Diabetes mellitus without complication (HCC)    diet controlled , type II   . Family history of adverse reaction to anesthesia    sister- confused  . Glaucoma   . History of hiatal hernia   . History of kidney stones   . Hypertension   . Renal disorder   . Shortness of breath dyspnea    with exertion    Patient Active Problem List   Diagnosis Date Noted  . Hypercalcemia 06/04/2019  . Special screening for malignant neoplasms, colon   . Nephrolithiasis 08/20/2016  . 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 ABDOMINAL HYSTERECTOMY    . CARPAL TUNNEL RELEASE Right   . COLONOSCOPY    . COLONOSCOPY N/A 01/05/2019   Procedure: COLONOSCOPY;  Surgeon: Franky Macho, MD;  Location: [facility];  Service: Gastroenterology;  Laterality: N/A;  . CYSTOSCOPY/RETROGRADE/URETEROSCOPY/STONE EXTRACTION WITH BASKET Left 09/17/2016   Procedure: CYSTOSCOPY/RETROGRADE/URETEROSCOPY/STONE EXTRACTION WITH BASKET/ STENT REMOVAL;  Surgeon: Bjorn Pippin, MD;  Location: [facility];  Service: Urology;  Laterality: Left;  . EXTRACORPOREAL SHOCK WAVE LITHOTRIPSY Left 11/16/2018   Procedure: EXTRACORPOREAL SHOCK WAVE LITHOTRIPSY (ESWL);  Surgeon: Crista Elliot, MD;  Location: [facility];  Service: Urology;  Laterality: Left;  . EXTRACORPOREAL SHOCK WAVE LITHOTRIPSY Left 01/21/2019   Procedure: EXTRACORPOREAL SHOCK WAVE LITHOTRIPSY (ESWL);  Surgeon: Bjorn Pippin, MD;  Location: [facility];  Service: Urology;  Laterality: Left;  . IR URETERAL STENT LEFT NEW ACCESS W/O SEP NEPHROSTOMY CATH  08/20/2016  . JOINT REPLACEMENT    . NEPHROLITHOTOMY Left 08/20/2016   Procedure: LEFT NEPHROLITHOTOMY PERCUTANEOUS FIRST STAGE;  Surgeon: Bjorn Pippin, MD;  Location: [facility];  Service: Urology;  Laterality: Left;  . NEPHROLITHOTOMY Left 08/22/2016   Procedure: LEFT NEPHROLITHOTOMY PERCUTANEOUS SECOND LOOK;  Surgeon: Bjorn Pippin, MD;  Location: [facility];  Service: Urology;  Laterality: Left;  . Removal kidney stone    . TOTAL HIP ARTHROPLASTY Bilateral   . TOTAL HIP REVISION Left 11/22/2015   Procedure: Revision Left Total Hip Arthroplasty, Poly and Newman Pies Exchange;  Surgeon: Eldred Manges, MD;  Location: [facility] OR;  Service: Orthopedics;  Laterality: Left;  . TOTAL KNEE ARTHROPLASTY Right      OB History   No obstetric history on file.     History reviewed. No pertinent family history.  Social History   Tobacco Use  . Smoking status: Former Smoker    Packs/day: 1.00    Years: 30.00    Pack years: 30.00    Types: Cigarettes    Quit date: 05/13/2004    Years since quitting: 16.3  . Smokeless tobacco: Never Used  Vaping Use  . Vaping Use: Never used  Substance Use Topics  . Alcohol use: No     Alcohol/week: 0.0 standard drinks  . Drug use: No    Home Medications Prior to Admission medications   Medication Sig Start Date End Date Taking? Authorizing Provider  acetaminophen (TYLENOL) 650 MG CR tablet Take 1,300 mg by mouth 2 (two) times daily.    [provider]  allopurinol (ZYLOPRIM) 300 MG tablet Take 1 tablet (300 mg total) by mouth every evening. 06/08/20   Bjorn PippinWrenn, [name], MD  amLODipine (NORVASC) 5 MG tablet Take 5 mg by mouth daily.  08/15/15   [provider]  cholecalciferol (VITAMIN D) 1000 units tablet Take 1,000 Units by mouth daily.    [provider]  Cyanocobalamin (B-12 COMPLIANCE INJECTION IJ) Inject 1 Dose as directed every 30 (thirty) days.    [provider]  ezetimibe (ZETIA) 10 MG tablet Take 10 mg by mouth daily.    [provider]  Inositol Niacinate 500 MG CAPS Take 500 mg by mouth 2 (two) times daily.     [provider]  latanoprost (XALATAN) 0.005 % ophthalmic solution Place 1 drop into both eyes at bedtime. 08/15/15   [provider]  lisinopril (PRINIVIL,ZESTRIL) 20 MG tablet Take 20 mg by mouth 2 (two) times daily.  07/10/15   [provider]  omega-3 acid ethyl esters (LOVAZA) 1 g capsule Take 1 g by mouth 2 (two) times daily.  09/25/18   [provider]  ondansetron (ZOFRAN) 4 MG tablet Take 4 mg by mouth every 8 (eight) hours as needed for nausea or vomiting.    [provider]  oxyCODONE-acetaminophen (PERCOCET/ROXICET) 5-325 MG tablet Take 1-2 tablets by mouth every 4 (four) hours as needed for severe pain.    [provider]  Potassium Citrate 15 MEQ (1620 MG) TBCR Take 2 tabs qam and 1 tab qpm po 06/08/20   Bjorn PippinWrenn, [name], MD  pravastatin (PRAVACHOL) 40 MG tablet Take 40 mg by mouth at bedtime. 11/12/19   [provider]  prednisoLONE acetate (PRED FORTE) 1 % ophthalmic suspension Place into the right eye. 11/17/19   [provider]  sitaGLIPtin  (JANUVIA) 100 MG tablet Take 100 mg by mouth every other day.     [provider]    Allergies    Aspirin, Codeine, Crestor [rosuvastatin calcium], Flomax [tamsulosin hcl], and Pravastatin  Review of Systems   Review of Systems  Physical Exam Updated Vital Signs BP (!) 154/78   Pulse 89   Temp 98.5 F (36.9 C) (Oral)   Resp 17   Ht 5\' 3"  (1.6 m)   Wt 81.2 kg   SpO2 94%   BMI 31.71 kg/m   Physical Exam Vitals and nursing note reviewed.  Constitutional:      General: She is not in acute distress.    Appearance: She is well-developed. She is not ill-appearing, toxic-appearing or diaphoretic.  HENT:     Head:  Normocephalic and atraumatic.  Eyes:     Conjunctiva/sclera: Conjunctivae normal.  Cardiovascular:     Rate and Rhythm: Normal rate and regular rhythm.     Heart sounds: No murmur heard.   Pulmonary:     Effort: Pulmonary effort is normal. No respiratory distress.     Breath sounds: Normal breath sounds.  Abdominal:     Palpations: Abdomen is soft.     Tenderness: There is abdominal tenderness.  Musculoskeletal:        General: Normal range of motion.     Cervical back: Neck supple.  Skin:    General: Skin is warm and dry.  Neurological:     General: No focal deficit present.     Mental Status: She is alert and oriented to person, place, and time.  Psychiatric:        Mood and Affect: Mood normal.        Behavior: Behavior normal.     ED Results / Procedures / Treatments   Labs (all labs ordered are listed, but only abnormal results are displayed) Labs Reviewed  CBC WITH DIFFERENTIAL/PLATELET - Abnormal; Notable for the following components:      Result Value   WBC 11.5 (*)    Hemoglobin 11.7 (*)    Neutro Abs 9.9 (*)    All other components within normal limits  COMPREHENSIVE METABOLIC PANEL - Abnormal; Notable for the following components:   Potassium 3.1 (*)    Glucose, Bld 253 (*)    BUN 33 (*)    Creatinine, Ser 1.38 (*)    GFR,  Estimated 41 (*)    All other components within normal limits  RESP PANEL BY RT-PCR (FLU A&B, COVID) ARPGX2  LIPASE, BLOOD  URINALYSIS, ROUTINE W REFLEX MICROSCOPIC    EKG None  Radiology CT ABDOMEN PELVIS WO CONTRAST  Result Date: 08/27/2020 CLINICAL DATA:  Intractable vomiting, acute nonlocalized abdominal pain EXAM: CT ABDOMEN AND PELVIS WITHOUT CONTRAST TECHNIQUE: Multidetector CT imaging of the abdomen and pelvis was performed following the standard protocol without IV contrast. COMPARISON:  05/17/2019 FINDINGS: Lower chest: Large hiatal hernia, similar to that noted prom prior examination. Resultant mild right basilar atelectasis. Lung bases are otherwise clear. Mild coronary artery calcification. Hepatobiliary: No focal liver abnormality is seen. No gallstones, gallbladder wall thickening, or biliary dilatation. Pancreas: Unremarkable Spleen: Stable 2.4 cm cyst within the upper pole of the spleen. More ill-defined hypodensity within the lower pole of the spleen is stable, though not well characterized measuring 12 mm in diameter. The spleen is otherwise unremarkable. Adrenals/Urinary Tract: The adrenal glands are unremarkable. The kidneys are normal in size and position. Simple cortical cysts are noted bilaterally. Multiple nonobstructing renal calculi are noted bilaterally measuring up to 11 mm x 15 mm within the lower pole the left kidney. No hydronephrosis. No ureteral calculi. The bladder is partially obscured by streak artifact from bilateral hip prostheses. The visualized portion, however, is unremarkable peer Stomach/Bowel: Small and large bowel are within normal limits. Appendix appears normal. No evidence of bowel wall thickening, distention, or inflammatory changes. No free intraperitoneal gas or fluid. Vascular/Lymphatic: Moderate aortoiliac atherosclerotic calcification. No aortic aneurysm. No pathologic adenopathy within the abdomen and pelvis. Reproductive: Evaluation of the pelvis  is limited by streak artifact, however, hysterectomy has been performed. No definite at axial mass. Other: No abdominal wall hernia. Musculoskeletal: Bilateral total hip arthroplasty has been performed. Degenerative changes are seen within the lumbar spine. No lytic or blastic bone lesions. IMPRESSION: No  acute intra-abdominal pathology identified. No definite radiographic explanation for the patient's reported symptoms. Large hiatal hernia, similar in appearance to prior examination with the majority of the stomach within the intrathoracic compartment. Bilateral nonobstructing nephrolithiasis. No urolithiasis. No hydronephrosis. Stable indeterminate low-attenuation lesion within the lower pole of the spleen, not well characterized on this examination. This has, however, is stable since remote prior examination of 08/08/2016 and its stability over time favors a benign process. Aortic Atherosclerosis (ICD10-I70.0). Electronically Signed   By: Helyn Numbers MD   On: 08/27/2020 23:19    Procedures Procedures   Medications Ordered in ED Medications  iohexol (OMNIPAQUE) 300 MG/ML solution 80 mL ( Intravenous Canceled Entry 08/27/20 2254)  potassium chloride SA (KLOR-CON) CR tablet 40 mEq (has no administration in time range)  ondansetron (ZOFRAN-ODT) disintegrating tablet 4 mg (4 mg Oral Given 08/27/20 2017)  sodium chloride 0.9 % bolus 1,000 mL (0 mLs Intravenous Stopped 08/27/20 2257)    ED Course  I have reviewed the triage vital signs and the nursing notes.  Pertinent labs & imaging results that were available during my care of the patient were reviewed by me and considered in my medical decision making (see chart for details).    MDM Rules/Calculators/A&P                          70 year old female who presents to the ER with emesis which began today.  Reports 15-20 episodes of emesis, nonbloody, nonbilious.  On arrival, she was tachycardic and hypotensive, however afebrile.  This did improve  throughout the ED course.  On physical exam, she did have some generalized abdominal tenderness, no flank tenderness.  Differential includes viral gastroenteritis, COVID-19, pancreatitis, appendicitis, cholecystitis  Lab work and imaging ordered, reviewed and interpreted by me.  CBC with a leukocytosis of 11.5, could be infectious process or reactive secondary to vomiting.  Her CMP did show a hypokalemia of 3.1, she does have a history of this and this also could be low secondary to vomiting.  Her BUN/creatinine are elevated, creatinine of 1.38 she does have a history of kidney disease.  Covid test is negative.  Lipase is normal.  Was contacted by CT imaging, patient cannot remember if she has a drug allergy to contrast, though did state that her face turned red.  Per chart review review, all of her CT scans were without contrast.  She does have a history of kidney disease but renal function is adequate.  CT of the abdomen without any acute abnormalities.  She does have a large hiatal hernia, however this has been stable.  She received 1 L of fluids, Zofran, notes that she feels a lot better.   Signed out care to Dr. Manus Gunning. Plan for fluid challenge, UA, and likely discharge if she passes fluid challenge. Final Clinical Impression(s) / ED Diagnoses Final diagnoses:  Acute gastritis without hemorrhage, unspecified gastritis type    Rx / DC Orders ED Discharge Orders    None       Leone Brand 08/27/20 2333    Glynn Octave, MD 08/28/20 [phone]

## 2020-08-27 NOTE — ED Triage Notes (Signed)
Emergency Medicine Provider Triage Evaluation Note  Gwendolyn Snyder , a 70 y.o. female  was evaluated in triage.  Pt complains of nausea, vomiting, abdominal pain.  Has had about 20-30 episodes of emesis.  No blood.  Has not been able to hold any fluids or food down.  No fevers or chills.  No chest pain, no shortness of breath.  No dysuria or hematuria.  Last bowel movement was yesterday normal  Review of Systems  Positive: As above Negative: Fevers, chills, chest pain, shortness of breath, dysuria, hematuria, fevers, chills  Physical Exam  BP (!) 186/98 (BP Location: Right Arm)   Pulse (!) 121   Temp 98.5 F (36.9 C) (Oral)   Resp 18   Ht 5\' 3"  (1.6 m)   Wt 81.2 kg   SpO2 96%   BMI 31.71 kg/m  Gen:   Awake, no distress   HEENT:  Atraumatic Resp:  Normal effort  Cardiac:  Normal rate  Abd:   Generalized abdominal tenderness, no flank tenderness MSK:   Moves extremities without difficulty  Neuro:  Speech clear   Medical Decision Making  Medically screening exam initiated at 7:43 PM.  Appropriate orders placed.  ADRIE PICKING was informed that the remainder of the evaluation will be completed by another provider, this initial triage assessment does not replace that evaluation, and the importance of remaining in the ED until their evaluation is complete.  Clinical Impression  70 year old female presenting with multiple episodes of emesis.  She does have some generalized abdominal pain on exam, plan for labs, Zofran.  Stable for further evaluation.   78, PA-C 08/27/20 1946

## 2020-08-28 LAB — TROPONIN I (HIGH SENSITIVITY)
Troponin I (High Sensitivity): 13 ng/L (ref <18)
Troponin I (High Sensitivity): 12 ng/L (ref <18)

## 2020-08-28 LAB — CBG MONITORING, ED: Glucose-Capillary: 140 mg/dL — ABNORMAL HIGH (ref 70–99)

## 2020-08-28 LAB — URINALYSIS, ROUTINE W REFLEX MICROSCOPIC
Bilirubin Urine: NEGATIVE
Glucose, UA: NEGATIVE mg/dL
Hgb urine dipstick: NEGATIVE
Ketones, ur: NEGATIVE mg/dL
Nitrite: NEGATIVE
Protein, ur: 30 mg/dL — AB
Specific Gravity, Urine: 1.02 (ref 1.005–1.030)
pH: 5 (ref 5.0–8.0)

## 2020-08-28 MED ORDER — OMEPRAZOLE 20 MG PO CPDR: 20.0000 mg | DELAYED_RELEASE_CAPSULE | Freq: Every day | ORAL | 0 refills | Status: DC

## 2020-08-28 MED ORDER — ONDANSETRON 4 MG PO TBDP: 4.0000 mg | ORAL_TABLET | Freq: Three times a day (TID) | ORAL | 0 refills | Status: DC | PRN

## 2020-08-28 NOTE — ED Provider Notes (Signed)
Care assumed from PAC-Belaya.  Patient with a history of diabetes, CKD, COPD, hypertension here with nausea and vomiting onset this afternoon at or after eating a piece of cake.  States she felt well before this.  Had multiple episodes of vomiting and upper abdominal pain.  No diarrhea or fever.  No one else got sick. No chest pain or shortness of breath.  States this happens occasionally when she eats sugary foods but never this severely.  Labs reassuring with mild hyperglycemia.  No DKA.  CT scan with hiatal hernia without other acute pathology.  Initial EKG from 2025 PM showed a wandering baseline with some questionable ST elevation in the inferior leads. And lateral depression in v5 and v6. I came on shift at 2300 and noticed this.  Upon seeing this EKG was repeated at 2357.  No ST elevation. Patient denies any chest pain  Discussed with Dr. Katrinka Blazing of interventional cardiology.  He reviewed both EKGs.  He feels EKG at 2025 is a wandering baseline artifact with similar Q waves inferiorly to previous. Repeat EKG at 2357 shows no STEMI. Dr. Katrinka Blazing would not activate a STEMI based on initial EKG at 2025  Patient continues to deny any chest pain.  Her abdomen is soft and nontender.  Troponins are negative x2.  Repeat EKG is unchanged with stable Q waves inferiorly.  No ST elevation. Suspect initial EKG at 2025 had significant artifact. Subsequent EKGs are stable to her previous from 2017.  Troponins remain negative.  She is tolerating p.o. without difficulty.  Urinalysis shows pyuria without bacteria.  Will send for culture.  Suspect her nausea and vomiting in setting of high sugar food content.  Hiatal hernia and acid reflux likely contributing as well.  Start PPI, avoid alcohol, caffeine, spicy foods, NSAID medications. Will refer to GI for endoscopy.  Return precautions discussed  BP (!) 159/79   Pulse 77   Temp 98.2 F (36.8 C) (Oral)   Resp 20   Ht 5\' 3"  (1.6 m)   Wt 81.2 kg   SpO2  95%   BMI 31.71 kg/m     , MD 08/28/20 [phone]

## 2020-08-28 NOTE — Discharge Instructions (Signed)
Your testing is reassuring.  As we discussed you have a hiatal hernia.  Start the Protonix acid reducer as prescribed.  Avoid alcohol, caffeine, NSAID medications such as ibuprofen or aspirin.  Follow-up with your primary doctor as well as a gastroenterologist for possible EGD study. Return to the ED if you develop new or worsening symptoms.

## 2020-08-28 NOTE — ED Notes (Addendum)
Consult to STEMI Dr for consultation to Dr Rancour @ [phone]

## 2020-08-30 LAB — URINE CULTURE: Culture: NO GROWTH

## 2020-09-05 ENCOUNTER — Encounter: Payer: Self-pay | Admitting: Internal Medicine

## 2020-09-06 MED ORDER — SODIUM CHLORIDE 0.9 % IV SOLN
510.0000 mg | Freq: Once | INTRAVENOUS | Status: AC
  Administered 2020-09-07: 510 mg via INTRAVENOUS
  Filled 2020-09-06: qty 17

## 2020-09-07 ENCOUNTER — Other Ambulatory Visit: Payer: Self-pay

## 2020-09-07 ENCOUNTER — Encounter (HOSPITAL_COMMUNITY)
Admission: RE | Admit: 2020-09-07 | Discharge: 2020-09-07 | Disposition: A | Discharge status: Home / Self Care | Payer: PPO | Source: Ambulatory Visit | Attending: Nephrology | Admitting: Nephrology

## 2020-09-07 DIAGNOSIS — D509 Iron deficiency anemia, unspecified: Secondary | ICD-10-CM | POA: Diagnosis not present

## 2020-09-07 MED ORDER — SODIUM CHLORIDE 0.9 % IV SOLN
INTRAVENOUS | Status: DC
  Administered 2020-09-07: 250 mL via INTRAVENOUS

## 2020-09-09 DIAGNOSIS — E7849 Other hyperlipidemia: Secondary | ICD-10-CM | POA: Diagnosis not present

## 2020-09-09 DIAGNOSIS — E1122 Type 2 diabetes mellitus with diabetic chronic kidney disease: Secondary | ICD-10-CM | POA: Diagnosis not present

## 2020-09-09 DIAGNOSIS — N182 Chronic kidney disease, stage 2 (mild): Secondary | ICD-10-CM | POA: Diagnosis not present

## 2020-09-11 DIAGNOSIS — D510 Vitamin B12 deficiency anemia due to intrinsic factor deficiency: Secondary | ICD-10-CM | POA: Diagnosis not present

## 2020-09-13 MED ORDER — SODIUM CHLORIDE 0.9 % IV SOLN
510.0000 mg | Freq: Once | INTRAVENOUS | Status: DC
  Filled 2020-09-13: qty 17

## 2020-09-13 MED ORDER — SODIUM CHLORIDE 0.9 % IV SOLN
510.0000 mg | Freq: Once | INTRAVENOUS | Status: AC
  Administered 2020-09-14: 510 mg via INTRAVENOUS
  Filled 2020-09-13: qty 17

## 2020-09-14 ENCOUNTER — Other Ambulatory Visit: Payer: Self-pay

## 2020-09-14 ENCOUNTER — Encounter (HOSPITAL_COMMUNITY): Payer: Self-pay

## 2020-09-14 ENCOUNTER — Encounter (HOSPITAL_COMMUNITY)
Admission: RE | Admit: 2020-09-14 | Discharge: 2020-09-14 | Disposition: A | Discharge status: Home / Self Care | Payer: PPO | Source: Ambulatory Visit | Attending: Nephrology | Admitting: Nephrology

## 2020-09-14 DIAGNOSIS — D509 Iron deficiency anemia, unspecified: Secondary | ICD-10-CM | POA: Diagnosis not present

## 2020-09-14 MED ORDER — SODIUM CHLORIDE 0.9 % IV SOLN: INTRAVENOUS | Status: DC

## 2020-09-18 DIAGNOSIS — I700 Atherosclerosis of aorta: Secondary | ICD-10-CM | POA: Diagnosis not present

## 2020-09-18 DIAGNOSIS — N200 Calculus of kidney: Secondary | ICD-10-CM | POA: Diagnosis not present

## 2020-09-18 DIAGNOSIS — Z6831 Body mass index (BMI) 31.0-31.9, adult: Secondary | ICD-10-CM | POA: Diagnosis not present

## 2020-09-18 DIAGNOSIS — E6609 Other obesity due to excess calories: Secondary | ICD-10-CM | POA: Diagnosis not present

## 2020-09-18 DIAGNOSIS — E1129 Type 2 diabetes mellitus with other diabetic kidney complication: Secondary | ICD-10-CM | POA: Diagnosis not present

## 2020-09-18 DIAGNOSIS — G729 Myopathy, unspecified: Secondary | ICD-10-CM | POA: Diagnosis not present

## 2020-09-18 DIAGNOSIS — E049 Nontoxic goiter, unspecified: Secondary | ICD-10-CM | POA: Diagnosis not present

## 2020-09-18 DIAGNOSIS — I129 Hypertensive chronic kidney disease with stage 1 through stage 4 chronic kidney disease, or unspecified chronic kidney disease: Secondary | ICD-10-CM | POA: Diagnosis not present

## 2020-09-18 DIAGNOSIS — I10 Essential (primary) hypertension: Secondary | ICD-10-CM | POA: Diagnosis not present

## 2020-09-20 ENCOUNTER — Other Ambulatory Visit: Payer: Self-pay

## 2020-09-20 ENCOUNTER — Emergency Department (HOSPITAL_COMMUNITY)
Admission: EM | Admit: 2020-09-20 | Discharge: 2020-09-20 | Disposition: A | Discharge status: Home / Self Care | Payer: PPO | Attending: Emergency Medicine | Admitting: Emergency Medicine

## 2020-09-20 ENCOUNTER — Encounter (HOSPITAL_COMMUNITY): Payer: Self-pay

## 2020-09-20 DIAGNOSIS — Z87891 Personal history of nicotine dependence: Secondary | ICD-10-CM | POA: Diagnosis not present

## 2020-09-20 DIAGNOSIS — I10 Essential (primary) hypertension: Secondary | ICD-10-CM | POA: Diagnosis not present

## 2020-09-20 DIAGNOSIS — E119 Type 2 diabetes mellitus without complications: Secondary | ICD-10-CM | POA: Diagnosis not present

## 2020-09-20 DIAGNOSIS — Z7984 Long term (current) use of oral hypoglycemic drugs: Secondary | ICD-10-CM | POA: Insufficient documentation

## 2020-09-20 DIAGNOSIS — Z96651 Presence of right artificial knee joint: Secondary | ICD-10-CM | POA: Diagnosis not present

## 2020-09-20 DIAGNOSIS — Z96643 Presence of artificial hip joint, bilateral: Secondary | ICD-10-CM | POA: Diagnosis not present

## 2020-09-20 DIAGNOSIS — Z79899 Other long term (current) drug therapy: Secondary | ICD-10-CM | POA: Insufficient documentation

## 2020-09-20 DIAGNOSIS — K2971 Gastritis, unspecified, with bleeding: Secondary | ICD-10-CM | POA: Diagnosis not present

## 2020-09-20 DIAGNOSIS — J449 Chronic obstructive pulmonary disease, unspecified: Secondary | ICD-10-CM | POA: Diagnosis not present

## 2020-09-20 DIAGNOSIS — K2970 Gastritis, unspecified, without bleeding: Secondary | ICD-10-CM | POA: Insufficient documentation

## 2020-09-20 DIAGNOSIS — R1010 Upper abdominal pain, unspecified: Secondary | ICD-10-CM | POA: Diagnosis present

## 2020-09-20 LAB — COMPREHENSIVE METABOLIC PANEL
ALT: 24 U/L (ref 0–44)
AST: 19 U/L (ref 15–41)
Albumin: 4.1 g/dL (ref 3.5–5.0)
Alkaline Phosphatase: 88 U/L (ref 38–126)
Anion gap: 13 (ref 5–15)
BUN: 35 mg/dL — ABNORMAL HIGH (ref 8–23)
CO2: 27 mmol/L (ref 22–32)
Calcium: 10 mg/dL (ref 8.9–10.3)
Chloride: 101 mmol/L (ref 98–111)
Creatinine, Ser: 1.45 mg/dL — ABNORMAL HIGH (ref 0.44–1.00)
GFR, Estimated: 39 mL/min — ABNORMAL LOW (ref >60)
Glucose, Bld: 167 mg/dL — ABNORMAL HIGH (ref 70–99)
Potassium: 3.9 mmol/L (ref 3.5–5.1)
Sodium: 141 mmol/L (ref 135–145)
Total Bilirubin: 0.5 mg/dL (ref 0.3–1.2)
Total Protein: 7.9 g/dL (ref 6.5–8.1)

## 2020-09-20 LAB — CBC WITH DIFFERENTIAL/PLATELET
Abs Immature Granulocytes: 0.06 10*3/uL (ref 0.00–0.07)
Basophils Absolute: 0.1 10*3/uL (ref 0.0–0.1)
Basophils Relative: 0 %
Eosinophils Absolute: 0.3 10*3/uL (ref 0.0–0.5)
Eosinophils Relative: 2 %
HCT: 44 % (ref 36.0–46.0)
Hemoglobin: 13.5 g/dL (ref 12.0–15.0)
Immature Granulocytes: 1 %
Lymphocytes Relative: 13 %
Lymphs Abs: 1.7 10*3/uL (ref 0.7–4.0)
MCH: 28.5 pg (ref 26.0–34.0)
MCHC: 30.7 g/dL (ref 30.0–36.0)
MCV: 93 fL (ref 80.0–100.0)
Monocytes Absolute: 0.8 10*3/uL (ref 0.1–1.0)
Monocytes Relative: 6 %
Neutro Abs: 10.2 10*3/uL — ABNORMAL HIGH (ref 1.7–7.7)
Neutrophils Relative %: 78 %
Platelets: 355 10*3/uL (ref 150–400)
RBC: 4.73 MIL/uL (ref 3.87–5.11)
RDW: 14.8 % (ref 11.5–15.5)
WBC: 13.1 10*3/uL — ABNORMAL HIGH (ref 4.0–10.5)
nRBC: 0 % (ref 0.0–0.2)

## 2020-09-20 LAB — TYPE AND SCREEN
ABO/RH(D): O POS
Antibody Screen: NEGATIVE

## 2020-09-20 LAB — LIPASE, BLOOD: Lipase: 34 U/L (ref 11–51)

## 2020-09-20 MED ORDER — FAMOTIDINE 20 MG PO TABS
20.0000 mg | ORAL_TABLET | Freq: Once | ORAL | Status: AC
  Administered 2020-09-20: 20 mg via ORAL
  Filled 2020-09-20: qty 1

## 2020-09-20 MED ORDER — FAMOTIDINE 20 MG PO TABS: 20.0000 mg | ORAL_TABLET | Freq: Two times a day (BID) | ORAL | 0 refills | Status: DC

## 2020-09-20 MED ORDER — PANTOPRAZOLE SODIUM 40 MG IV SOLR
40.0000 mg | Freq: Once | INTRAVENOUS | Status: DC
  Filled 2020-09-20: qty 40

## 2020-09-20 MED ORDER — ONDANSETRON HCL 4 MG/2ML IJ SOLN
4.0000 mg | Freq: Once | INTRAMUSCULAR | Status: DC
  Filled 2020-09-20: qty 2

## 2020-09-20 MED ORDER — ONDANSETRON HCL 8 MG PO TABS: 8.0000 mg | ORAL_TABLET | Freq: Three times a day (TID) | ORAL | 0 refills | Status: DC | PRN

## 2020-09-20 MED ORDER — SODIUM CHLORIDE 0.9 % IV BOLUS: 1000.0000 mL | Freq: Once | INTRAVENOUS | Status: DC

## 2020-09-20 MED ORDER — ONDANSETRON 8 MG PO TBDP
8.0000 mg | ORAL_TABLET | Freq: Once | ORAL | Status: AC
  Administered 2020-09-20: 8 mg via ORAL
  Filled 2020-09-20: qty 1

## 2020-09-20 NOTE — ED Provider Notes (Signed)
[facility] EMERGENCY DEPARTMENT Provider Note   CSN: [id number] Arrival date & time: 09/20/20  2032     History Chief Complaint  Patient presents with  . Abdominal Pain  . Vomiting    Gwendolyn Snyder is a 70 y.o. female.  HPI She presents for evaluation of upper abdominal cramping with vomiting several times today.  She thinks the emesis looked like "coffee grounds."  4 weeks ago she was evaluated in the ED and started on omeprazole for gastritis.  She took it for 2 weeks and stopped because she feels like it made her constipated.  She has been referred to GI and has appointment with them on 10/25/2020.  She denies fever, chills, cough, shortness of breath, chest pain, weakness or dizziness.  She does not take anticoagulants.  No prior similar problems.  There are no other known active modifying factors.    Past Medical History:  Diagnosis Date  . Anemia   . Anxiety   . Arthritis   . Bilateral carpal tunnel syndrome    right hand- surgery , left hand has not and left hand goes to sleep   . Cataracts, bilateral   . Complication of anesthesia    once after surgery felt like she could not breathe- 11/09/15- surgery greater than 11 years ago  . COPD (chronic obstructive pulmonary disease) ([place])   . Diabetes mellitus without complication ([place])    diet controlled , type II   . Family history of adverse reaction to anesthesia    sister- confused  . Glaucoma   . History of hiatal hernia   . History of kidney stones   . Hypertension   . Renal disorder   . Shortness of breath dyspnea    with exertion    Patient Active Problem List   Diagnosis Date Noted  . Hypercalcemia 06/04/2019  . Special screening for malignant neoplasms, colon   . Nephrolithiasis 08/20/2016  . 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 ABDOMINAL HYSTERECTOMY    . CARPAL  TUNNEL RELEASE Right   . COLONOSCOPY    . COLONOSCOPY N/A 01/05/2019   Procedure: COLONOSCOPY;  Surgeon: Aviva Signs, MD;  Location: [facility];  Service: Gastroenterology;  Laterality: N/A;  . CYSTOSCOPY/RETROGRADE/URETEROSCOPY/STONE EXTRACTION WITH BASKET Left 09/17/2016   Procedure: CYSTOSCOPY/RETROGRADE/URETEROSCOPY/STONE EXTRACTION WITH BASKET/ STENT REMOVAL;  Surgeon: Irine Seal, MD;  Location: [facility];  Service: Urology;  Laterality: Left;  . EXTRACORPOREAL SHOCK WAVE LITHOTRIPSY Left 11/16/2018   Procedure: EXTRACORPOREAL SHOCK WAVE LITHOTRIPSY (ESWL);  Surgeon: Lucas Mallow, MD;  Location: [facility];  Service: Urology;  Laterality: Left;  . EXTRACORPOREAL SHOCK WAVE LITHOTRIPSY Left 01/21/2019   Procedure: EXTRACORPOREAL SHOCK WAVE LITHOTRIPSY (ESWL);  Surgeon: Irine Seal, MD;  Location: [facility];  Service: Urology;  Laterality: Left;  . IR URETERAL STENT LEFT NEW ACCESS W/O SEP NEPHROSTOMY CATH  08/20/2016  . JOINT REPLACEMENT    . NEPHROLITHOTOMY Left 08/20/2016   Procedure: LEFT NEPHROLITHOTOMY PERCUTANEOUS FIRST STAGE;  Surgeon: Irine Seal, MD;  Location: [facility];  Service: Urology;  Laterality: Left;  . NEPHROLITHOTOMY Left 08/22/2016   Procedure: LEFT NEPHROLITHOTOMY PERCUTANEOUS SECOND LOOK;  Surgeon: Irine Seal, MD;  Location: [facility];  Service: Urology;  Laterality: Left;  . Removal kidney stone    . TOTAL HIP ARTHROPLASTY Bilateral   . TOTAL HIP REVISION Left 11/22/2015   Procedure: Revision Left Total  Hip Arthroplasty, Poly and [place] Exchange;  Surgeon: Marybelle Killings, MD;  Location: [place];  Service: Orthopedics;  Laterality: Left;  . TOTAL KNEE ARTHROPLASTY Right      OB History   No obstetric history on file.     History reviewed. No pertinent family history.  Social History   Tobacco Use  . Smoking status: Former Smoker    Packs/day: 1.00    Years: 30.00    Pack years: 30.00    Types: Cigarettes    Quit date: 05/13/2004    Years since quitting: 16.3  . Smokeless  tobacco: Never Used  Vaping Use  . Vaping Use: Never used  Substance Use Topics  . Alcohol use: No    Alcohol/week: 0.0 standard drinks  . Drug use: No    Home Medications Prior to Admission medications   Medication Sig Start Date End Date Taking? Authorizing Provider  famotidine (PEPCID) 20 MG tablet Take 1 tablet (20 mg total) by mouth 2 (two) times daily. 09/20/20  Yes Daleen Bo, MD  ondansetron (ZOFRAN) 8 MG tablet Take 1 tablet (8 mg total) by mouth every 8 (eight) hours as needed for nausea or vomiting. 09/20/20  Yes Daleen Bo, MD  acetaminophen (TYLENOL) 650 MG CR tablet Take 1,300 mg by mouth 2 (two) times daily.    [provider]  allopurinol (ZYLOPRIM) 300 MG tablet Take 1 tablet (300 mg total) by mouth every evening. 06/08/20   Irine Seal, MD  amLODipine (NORVASC) 5 MG tablet Take 10 mg by mouth daily. 08/15/15   [provider]  cholecalciferol (VITAMIN D) 1000 units tablet Take 1,000 Units by mouth daily.    [provider]  Cyanocobalamin (B-12 COMPLIANCE INJECTION IJ) Inject 1 Dose as directed every 30 (thirty) days.    [provider]  ezetimibe (ZETIA) 10 MG tablet Take 10 mg by mouth daily.    [provider]  Inositol Niacinate 500 MG CAPS Take 500 mg by mouth 2 (two) times daily.     [provider]  latanoprost (XALATAN) 0.005 % ophthalmic solution Place 1 drop into both eyes at bedtime. 08/15/15   [provider]  lisinopril (PRINIVIL,ZESTRIL) 20 MG tablet Take 20 mg by mouth 2 (two) times daily.  07/10/15   [provider]  omega-3 acid ethyl esters (LOVAZA) 1 g capsule Take 1 g by mouth 2 (two) times daily.  09/25/18   [provider]  ondansetron (ZOFRAN ODT) 4 MG disintegrating tablet Take 1 tablet (4 mg total) by mouth every 8 (eight) hours as needed for nausea or vomiting. 08/28/20   Rancour, Annie Main, MD  oxyCODONE-acetaminophen (PERCOCET/ROXICET) 5-325 MG tablet Take 1-2 tablets by  mouth every 4 (four) hours as needed for severe pain.    [provider]  Potassium Citrate 15 MEQ (1620 MG) TBCR Take 2 tabs qam and 1 tab qpm po 06/08/20   Irine Seal, MD  pravastatin (PRAVACHOL) 40 MG tablet Take 40 mg by mouth at bedtime. 11/12/19   [provider]  prednisoLONE acetate (PRED FORTE) 1 % ophthalmic suspension Place into the right eye. 11/17/19   [provider]  sitaGLIPtin (JANUVIA) 100 MG tablet Take 100 mg by mouth every other day.     [provider]  omeprazole (PRILOSEC) 20 MG capsule Take 1 capsule (20 mg total) by mouth daily. 08/28/20 09/20/20  Ezequiel Essex, MD    Allergies    Aspirin, Codeine, Crestor [rosuvastatin calcium], Flomax [tamsulosin hcl], and Pravastatin  Review of Systems   Review of Systems  All other systems reviewed and are negative.   Physical Exam Updated Vital Signs BP 137/88 (BP Location: Right Arm)   Pulse (!) 112   Temp 98.8 F (37.1 C) (Oral)   Resp 18   Ht _0  (1.6 m)   Wt 79.4 kg   SpO2 95%   BMI 31.00 kg/m   Physical Exam Vitals and nursing note reviewed.  Constitutional:      General: She is not in acute distress.    Appearance: She is well-developed. She is not ill-appearing, toxic-appearing or diaphoretic.  HENT:     Head: Normocephalic and atraumatic.     Right Ear: External ear normal.     Left Ear: External ear normal.     Mouth/Throat:     Mouth: Mucous membranes are moist.     Pharynx: No posterior oropharyngeal erythema.  Eyes:     Conjunctiva/sclera: Conjunctivae normal.     Pupils: Pupils are equal, round, and reactive to light.  Neck:     Trachea: Phonation normal.  Cardiovascular:     Rate and Rhythm: Normal rate and regular rhythm.     Heart sounds: Normal heart sounds.  Pulmonary:     Effort: Pulmonary effort is normal. No respiratory distress.     Breath sounds: Normal breath sounds. No stridor.  Abdominal:     General: There is no distension.      Palpations: Abdomen is soft.     Tenderness: There is no abdominal tenderness.  Musculoskeletal:        General: Normal range of motion.     Cervical back: Normal range of motion and neck supple.  Skin:    General: Skin is warm and dry.  Neurological:     Mental Status: She is alert and oriented to person, place, and time.     Cranial Nerves: No cranial nerve deficit.     Sensory: No sensory deficit.     Motor: No abnormal muscle tone.     Coordination: Coordination normal.  Psychiatric:        Mood and Affect: Mood normal.        Behavior: Behavior normal.        Thought Content: Thought content normal.        Judgment: Judgment normal.     ED Results / Procedures / Treatments   Labs (all labs ordered are listed, but only abnormal results are displayed) Labs Reviewed  COMPREHENSIVE METABOLIC PANEL - Abnormal; Notable for the following components:      Result Value   Glucose, Bld 167 (*)    BUN 35 (*)    Creatinine, Ser 1.45 (*)    GFR, Estimated 39 (*)    All other components within normal limits  CBC WITH DIFFERENTIAL/PLATELET - Abnormal; Notable for the following components:   WBC 13.1 (*)    Neutro Abs 10.2 (*)    All other components within normal limits  LIPASE, BLOOD  TYPE AND SCREEN    EKG None  Radiology No results found.  Procedures Procedures   Medications Ordered in ED Medications  famotidine (PEPCID) tablet 20 mg (has no administration in time range)  ondansetron (ZOFRAN-ODT) disintegrating tablet 8 mg (has no administration in time range)    ED Course  I have reviewed the triage vital signs and the nursing notes.  Pertinent labs & imaging results that were available during my care of the patient were reviewed by me and  considered in my medical decision making (see chart for details).    MDM Rules/Calculators/A&P                           Patient Vitals for the past 24 hrs:  BP Temp Temp src Pulse Resp SpO2 Height Weight  09/20/20 2047  137/88 98.8 F (37.1 C) Oral (!) 112 18 95 % _0  (1.6 m) 79.4 kg    11:00 PM Reevaluation with update and discussion. After initial assessment and treatment, an updated evaluation reveals no vomiting during the period of observation in the ED.  Medications, and IV ordered at 2115, not yet started.  Patient states she feels well enough to go home at this time without IV fluids and medication.  Findings discussed with the patient and all questions were answered. Daleen Bo   Medical Decision Making:  This patient is presenting for evaluation of upper abdominal discomfort with vomiting, nonspecific, which does require a range of treatment options, and is a complaint that involves a moderate risk of morbidity and mortality. The differential diagnoses include gastritis, peptic ulcer disease, nonspecific vomiting. I decided to review old records, and in summary elderly female with ongoing symptoms for several weeks, did not tolerate Protonix when it was prescribed recently.  She presents now with possible hematemesis.  I did not require additional historical information from anyone.  Clinical Laboratory Tests Ordered, included CBC, Metabolic panel and Lipase. Review indicates normal except glucose high, BUN high, creatinine high, WBC high.  Critical Interventions-clinical evaluation, laboratory testing, medication given, observation reassess  After These Interventions, the Patient was reevaluated and was found patient feels comfortable without vomiting or period of observation.  Nonspecific complaints with mild abnormalities on c-Met and CBC.  Doubt significant blood loss, significant renal insufficiency or impending vascular collapse.  Patient's differential diagnosis includes peptic ulcer disease and gastritis.  She did not tolerate PPI we will start gastric acid blocker and discharged with prescription for Zofran  CRITICAL CARE- no Performed by: Daleen Bo  Nursing Notes Reviewed/ Care  Coordinated Applicable Imaging Reviewed Interpretation of Laboratory Data incorporated into ED treatment  The patient appears reasonably screened and/or stabilized for discharge and I doubt any other medical condition or other [facility] requiring further screening, evaluation, or treatment in the ED at this time prior to discharge.  Plan: Home Medications-continue usual insect omeprazole; Home Treatments-gradual advance diet; return here if the recommended treatment, does not improve the symptoms; Recommended follow up-GI as scheduled     Final Clinical Impression(s) / ED Diagnoses Final diagnoses:  Gastritis, presence of bleeding unspecified, unspecified chronicity, unspecified gastritis type    Rx / DC Orders ED Discharge Orders         Ordered    famotidine (PEPCID) 20 MG tablet  2 times daily        09/20/20 2316    ondansetron (ZOFRAN) 8 MG tablet  Every 8 hours PRN        09/20/20 2316           Daleen Bo, MD 09/20/20 2317

## 2020-09-20 NOTE — ED Triage Notes (Signed)
Pt to er, pt states that she is here for some gas like abd pain and vomiting, states that she was here on easter for the same.  Pt states that she has vomited about 6 times today, states that she called the nurse line and was told to come to the er.

## 2020-09-20 NOTE — ED Provider Notes (Signed)
Emergency Medicine Provider Triage Evaluation Note  TAKYAH CIARAMITARO , a 70 y.o. female  was evaluated in triage.  Pt complains of abdominal cramping associated with vomiting, several times.  She states the emesis looks like "coffee grounds."  She was in the ED, almost 4 weeks ago at which time she was started on Protonix for gastritis.  She states she took it for 2 weeks, and had to stop because it made her constipated.  She has not seen GI as recommended, yet.  She has not seen any blood in her emesis.  She denies fever, shortness of breath, chest pain or cough..  Review of Systems  Positive: Coffee-ground emesis, upper abdominal cramping, chronic loose bowel movement Negative: No chest pain, no shortness of breath, no focal weakness or paresthesia  Physical Exam  BP 137/88 (BP Location: Right Arm)   Pulse (!) 112   Temp 98.8 F (37.1 C) (Oral)   Resp 18   Ht 5\' 3"  (1.6 m)   Wt 79.4 kg   SpO2 95%   BMI 31.00 kg/m  Gen:   Awake, no distress  Resp:  Normal effort  MSK:   Moves extremities without difficulty  Other:  Abdomen nontender palpation no associated mass or deformity.  Medical Decision Making  Medically screening exam initiated at 9:07 PM.  Appropriate orders placed.  RANDALYN AHMED was informed that the remainder of the evaluation will be completed by another provider, this initial triage assessment does not replace that evaluation, and the importance of remaining in the ED until their evaluation is complete.  Patient presenting with upper abdominal discomfort associated with vomiting with possible blood in the emesis.  She is at risk for upper GI bleeding.  She has mild tachycardia on arrival.  Will screen for bleeding, treat symptoms and observe closely.   Paralee Cancel, MD 09/20/20 2114

## 2020-09-20 NOTE — Discharge Instructions (Signed)
We sent prescriptions to your pharmacy.  Take them as directed.  Follow-up with your GI doctor as instructed.

## 2020-09-21 ENCOUNTER — Encounter (HOSPITAL_COMMUNITY): Payer: Self-pay

## 2020-09-21 ENCOUNTER — Other Ambulatory Visit: Payer: Self-pay

## 2020-09-21 ENCOUNTER — Emergency Department (HOSPITAL_COMMUNITY): Payer: PPO

## 2020-09-21 ENCOUNTER — Emergency Department (HOSPITAL_COMMUNITY)
Admission: EM | Admit: 2020-09-21 | Discharge: 2020-09-21 | Disposition: A | Discharge status: Home / Self Care | Payer: PPO | Attending: Emergency Medicine | Admitting: Emergency Medicine

## 2020-09-21 DIAGNOSIS — Z87891 Personal history of nicotine dependence: Secondary | ICD-10-CM | POA: Diagnosis not present

## 2020-09-21 DIAGNOSIS — D7389 Other diseases of spleen: Secondary | ICD-10-CM | POA: Diagnosis not present

## 2020-09-21 DIAGNOSIS — Z79899 Other long term (current) drug therapy: Secondary | ICD-10-CM | POA: Insufficient documentation

## 2020-09-21 DIAGNOSIS — J449 Chronic obstructive pulmonary disease, unspecified: Secondary | ICD-10-CM | POA: Diagnosis not present

## 2020-09-21 DIAGNOSIS — N200 Calculus of kidney: Secondary | ICD-10-CM | POA: Diagnosis not present

## 2020-09-21 DIAGNOSIS — R1111 Vomiting without nausea: Secondary | ICD-10-CM | POA: Diagnosis not present

## 2020-09-21 DIAGNOSIS — Z96643 Presence of artificial hip joint, bilateral: Secondary | ICD-10-CM | POA: Insufficient documentation

## 2020-09-21 DIAGNOSIS — E119 Type 2 diabetes mellitus without complications: Secondary | ICD-10-CM | POA: Diagnosis not present

## 2020-09-21 DIAGNOSIS — Z96652 Presence of left artificial knee joint: Secondary | ICD-10-CM | POA: Diagnosis not present

## 2020-09-21 DIAGNOSIS — R1110 Vomiting, unspecified: Secondary | ICD-10-CM | POA: Diagnosis present

## 2020-09-21 DIAGNOSIS — K440 Diaphragmatic hernia with obstruction, without gangrene: Secondary | ICD-10-CM | POA: Diagnosis not present

## 2020-09-21 DIAGNOSIS — I10 Essential (primary) hypertension: Secondary | ICD-10-CM | POA: Insufficient documentation

## 2020-09-21 DIAGNOSIS — K449 Diaphragmatic hernia without obstruction or gangrene: Secondary | ICD-10-CM

## 2020-09-21 DIAGNOSIS — K469 Unspecified abdominal hernia without obstruction or gangrene: Secondary | ICD-10-CM | POA: Diagnosis not present

## 2020-09-21 DIAGNOSIS — R112 Nausea with vomiting, unspecified: Secondary | ICD-10-CM | POA: Diagnosis not present

## 2020-09-21 LAB — COMPREHENSIVE METABOLIC PANEL
ALT: 24 U/L (ref 0–44)
AST: 19 U/L (ref 15–41)
Albumin: 3.9 g/dL (ref 3.5–5.0)
Alkaline Phosphatase: 83 U/L (ref 38–126)
Anion gap: 9 (ref 5–15)
BUN: 37 mg/dL — ABNORMAL HIGH (ref 8–23)
CO2: 28 mmol/L (ref 22–32)
Calcium: 9.9 mg/dL (ref 8.9–10.3)
Chloride: 105 mmol/L (ref 98–111)
Creatinine, Ser: 1.58 mg/dL — ABNORMAL HIGH (ref 0.44–1.00)
GFR, Estimated: 35 mL/min — ABNORMAL LOW (ref >60)
Glucose, Bld: 169 mg/dL — ABNORMAL HIGH (ref 70–99)
Potassium: 3.9 mmol/L (ref 3.5–5.1)
Sodium: 142 mmol/L (ref 135–145)
Total Bilirubin: 0.7 mg/dL (ref 0.3–1.2)
Total Protein: 7.9 g/dL (ref 6.5–8.1)

## 2020-09-21 LAB — CBC WITH DIFFERENTIAL/PLATELET
Abs Immature Granulocytes: 0.03 10*3/uL (ref 0.00–0.07)
Basophils Absolute: 0.1 10*3/uL (ref 0.0–0.1)
Basophils Relative: 0 %
Eosinophils Absolute: 0 10*3/uL (ref 0.0–0.5)
Eosinophils Relative: 0 %
HCT: 41.2 % (ref 36.0–46.0)
Hemoglobin: 12.7 g/dL (ref 12.0–15.0)
Immature Granulocytes: 0 %
Lymphocytes Relative: 10 %
Lymphs Abs: 1.4 10*3/uL (ref 0.7–4.0)
MCH: 28.9 pg (ref 26.0–34.0)
MCHC: 30.8 g/dL (ref 30.0–36.0)
MCV: 93.8 fL (ref 80.0–100.0)
Monocytes Absolute: 0.7 10*3/uL (ref 0.1–1.0)
Monocytes Relative: 5 %
Neutro Abs: 11.2 10*3/uL — ABNORMAL HIGH (ref 1.7–7.7)
Neutrophils Relative %: 85 %
Platelets: 335 10*3/uL (ref 150–400)
RBC: 4.39 MIL/uL (ref 3.87–5.11)
RDW: 14.9 % (ref 11.5–15.5)
WBC: 13.5 10*3/uL — ABNORMAL HIGH (ref 4.0–10.5)
nRBC: 0 % (ref 0.0–0.2)

## 2020-09-21 LAB — LIPASE, BLOOD: Lipase: 30 U/L (ref 11–51)

## 2020-09-21 LAB — POC OCCULT BLOOD, ED: Fecal Occult Bld: NEGATIVE

## 2020-09-21 MED ORDER — METOCLOPRAMIDE HCL 5 MG/ML IJ SOLN
10.0000 mg | Freq: Once | INTRAMUSCULAR | Status: AC
  Administered 2020-09-21: 10 mg via INTRAVENOUS
  Filled 2020-09-21: qty 2

## 2020-09-21 MED ORDER — ONDANSETRON HCL 4 MG/2ML IJ SOLN
4.0000 mg | Freq: Once | INTRAMUSCULAR | Status: AC
  Administered 2020-09-21: 4 mg via INTRAVENOUS

## 2020-09-21 MED ORDER — SODIUM CHLORIDE 0.9 % IV BOLUS
500.0000 mL | Freq: Once | INTRAVENOUS | Status: AC
  Administered 2020-09-21: 500 mL via INTRAVENOUS

## 2020-09-21 MED ORDER — LIDOCAINE VISCOUS HCL 2 % MT SOLN
15.0000 mL | Freq: Once | OROMUCOSAL | Status: AC
  Administered 2020-09-21: 15 mL via ORAL
  Filled 2020-09-21: qty 15

## 2020-09-21 MED ORDER — PANTOPRAZOLE SODIUM 40 MG IV SOLR
40.0000 mg | Freq: Once | INTRAVENOUS | Status: AC
  Administered 2020-09-21: 40 mg via INTRAVENOUS
  Filled 2020-09-21: qty 40

## 2020-09-21 MED ORDER — ALUM & MAG HYDROXIDE-SIMETH 200-200-20 MG/5ML PO SUSP
30.0000 mL | Freq: Once | ORAL | Status: AC
  Administered 2020-09-21: 30 mL via ORAL
  Filled 2020-09-21: qty 30

## 2020-09-21 MED ORDER — PANTOPRAZOLE SODIUM 40 MG PO TBEC: 40.0000 mg | DELAYED_RELEASE_TABLET | Freq: Two times a day (BID) | ORAL | 1 refills | Status: DC

## 2020-09-21 MED ORDER — METOCLOPRAMIDE HCL 5 MG PO TABS: 10.0000 mg | ORAL_TABLET | Freq: Three times a day (TID) | ORAL | 1 refills | Status: DC

## 2020-09-21 MED ORDER — ONDANSETRON HCL 4 MG/2ML IJ SOLN
INTRAMUSCULAR | Status: AC
  Filled 2020-09-21: qty 2

## 2020-09-21 NOTE — ED Notes (Signed)
Sat pt up in bed, pt vomiting small amount of liquid, md notified, reglan given, pt denies pain.

## 2020-09-21 NOTE — ED Provider Notes (Signed)
[facility] EMERGENCY DEPARTMENT Provider Note   CSN: [id number] Arrival date & time: 09/21/20  2694     History Chief Complaint  Patient presents with  . Emesis    Gwendolyn Snyder is a 70 y.o. female.  Patient presents with continued vomiting.Marland Kitchen  She was prescribed Zofran and Pepcid yesterday.  Patient continued to vomit but no blood in her vomit or her stools.  Mild to moderate epigastric discomfort  The history is provided by the patient and medical records. No language interpreter was used.  Emesis Severity:  Moderate Timing:  Constant Quality:  Bilious material Able to tolerate:  Liquids Progression:  Unchanged Chronicity:  Recurrent Recent urination:  Normal Context: not post-tussive   Relieved by:  Nothing Worsened by:  Nothing Ineffective treatments:  None tried Associated symptoms: abdominal pain   Associated symptoms: no cough, no diarrhea and no headaches        Past Medical History:  Diagnosis Date  . Anemia   . Anxiety   . Arthritis   . Bilateral carpal tunnel syndrome    right hand- surgery , left hand has not and left hand goes to sleep   . Cataracts, bilateral   . Complication of anesthesia    once after surgery felt like she could not breathe- 11/09/15- surgery greater than 11 years ago  . COPD (chronic obstructive pulmonary disease) (HCC)   . Diabetes mellitus without complication (HCC)    diet controlled , type II   . Family history of adverse reaction to anesthesia    sister- confused  . Glaucoma   . History of hiatal hernia   . History of kidney stones   . Hypertension   . Renal disorder   . Shortness of breath dyspnea    with exertion    Patient Active Problem List   Diagnosis Date Noted  . Hypercalcemia 06/04/2019  . Special screening for malignant neoplasms, colon   . Nephrolithiasis 08/20/2016  . 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 ABDOMINAL HYSTERECTOMY    . CARPAL TUNNEL RELEASE Right   . COLONOSCOPY    . COLONOSCOPY N/A 01/05/2019   Procedure: COLONOSCOPY;  Surgeon: Franky Macho, MD;  [facility]: [facility];  Service: Gastroenterology;  Laterality: N/A;  . CYSTOSCOPY/RETROGRADE/URETEROSCOPY/STONE EXTRACTION WITH BASKET Left 09/17/2016   Procedure: CYSTOSCOPY/RETROGRADE/URETEROSCOPY/STONE EXTRACTION WITH BASKET/ STENT REMOVAL;  Surgeon: Bjorn Pippin, MD;  Location: [facility];  Service: Urology;  Laterality: Left;  . EXTRACORPOREAL SHOCK WAVE LITHOTRIPSY Left 11/16/2018   Procedure: EXTRACORPOREAL SHOCK WAVE LITHOTRIPSY (ESWL);  Surgeon: Crista Elliot, MD;  Location: [facility];  Service: Urology;  Laterality: Left;  . EXTRACORPOREAL SHOCK WAVE LITHOTRIPSY Left 01/21/2019   Procedure: EXTRACORPOREAL SHOCK WAVE LITHOTRIPSY (ESWL);  Surgeon: Bjorn Pippin, MD;  Location: [facility];  Service: Urology;  Laterality: Left;  . IR URETERAL STENT LEFT NEW ACCESS W/O SEP NEPHROSTOMY CATH  08/20/2016  . JOINT REPLACEMENT    . NEPHROLITHOTOMY Left 08/20/2016   Procedure: LEFT NEPHROLITHOTOMY PERCUTANEOUS FIRST STAGE;  Surgeon: Bjorn Pippin, MD;  Location: [facility];  Service: Urology;  Laterality: Left;  . NEPHROLITHOTOMY Left 08/22/2016   Procedure: LEFT NEPHROLITHOTOMY PERCUTANEOUS SECOND LOOK;  Surgeon: Bjorn Pippin, MD;  Location: [facility];  Service: Urology;  Laterality: Left;  . Removal kidney stone    . TOTAL HIP ARTHROPLASTY Bilateral   . TOTAL HIP REVISION Left 11/22/2015  Procedure: Revision Left Total Hip Arthroplasty, Poly and Ball Exchange;  Surgeon: Eldred Manges, MD;  Location: [facility] OR;  Service: Orthopedics;  Laterality: Left;  . TOTAL KNEE ARTHROPLASTY Right      OB History   No obstetric history on file.     No family history on file.  Social History   Tobacco Use  . Smoking status: Former Smoker    Packs/day: 1.00    Years: 30.00    Pack years: 30.00    Types: Cigarettes     Quit date: 05/13/2004    Years since quitting: 16.3  . Smokeless tobacco: Never Used  Vaping Use  . Vaping Use: Never used  Substance Use Topics  . Alcohol use: No    Alcohol/week: 0.0 standard drinks  . Drug use: No    Home Medications Prior to Admission medications   Medication Sig Start Date End Date Taking? Authorizing Provider  metoCLOPramide (REGLAN) 5 MG tablet Take 2 tablets (10 mg total) by mouth 3 (three) times daily before meals. 09/21/20  Yes Bethann Berkshire, MD  pantoprazole (PROTONIX) 40 MG tablet Take 1 tablet (40 mg total) by mouth 2 (two) times daily. 09/21/20  Yes Bethann Berkshire, MD  acetaminophen (TYLENOL) 650 MG CR tablet Take 1,300 mg by mouth 2 (two) times daily.    [provider]  allopurinol (ZYLOPRIM) 300 MG tablet Take 1 tablet (300 mg total) by mouth every evening. 06/08/20   Bjorn Pippin, MD  amLODipine (NORVASC) 10 MG tablet Take 1 tablet by mouth daily. 08/31/20   [provider]  cholecalciferol (VITAMIN D) 1000 units tablet Take 1,000 Units by mouth daily.    [provider]  cyanocobalamin (,VITAMIN B-12,) 1000 MCG/ML injection Inject as directed every 30 (thirty) days    [provider]  ezetimibe (ZETIA) 10 MG tablet Take 10 mg by mouth daily.    [provider]  FREESTYLE LITE test strip Use to test blood sugar once daily 08/11/20   [provider]  Inositol Niacinate 500 MG CAPS Take 500 mg by mouth 2 (two) times daily.     [provider]  lisinopril (PRINIVIL,ZESTRIL) 20 MG tablet Take 20 mg by mouth 2 (two) times daily.  07/10/15   [provider]  omega-3 acid ethyl esters (LOVAZA) 1 g capsule Take 1 g by mouth 2 (two) times daily.  09/25/18   [provider]  oxyCODONE-acetaminophen (PERCOCET/ROXICET) 5-325 MG tablet Take 1-2 tablets by mouth every 4 (four) hours as needed for severe pain.    [provider]  Potassium Citrate 15 MEQ (1620 MG) TBCR Take 2 tabs qam and 1  tab qpm po 06/08/20   Bjorn Pippin, MD  rosuvastatin (CRESTOR) 5 MG tablet Take 5 mg by mouth at bedtime. 08/11/20   [provider]  sitaGLIPtin (JANUVIA) 100 MG tablet Take 100 mg by mouth every other day.     [provider]  famotidine (PEPCID) 20 MG tablet Take 1 tablet (20 mg total) by mouth 2 (two) times daily. 09/20/20 09/21/20  Mancel Bale, MD  omeprazole (PRILOSEC) 20 MG capsule Take 1 capsule (20 mg total) by mouth daily. 08/28/20 09/20/20  Rancour, Jeannett Senior, MD    Allergies    Aspirin, Codeine, Crestor [rosuvastatin calcium], Flomax [tamsulosin hcl], and Pravastatin  Review of Systems   Review of Systems  Constitutional: Negative for appetite change and fatigue.  HENT: Negative for congestion, ear discharge and sinus pressure.   Eyes: Negative for discharge.  Respiratory: Negative for cough.   Cardiovascular: Negative for chest pain.  Gastrointestinal: Positive for abdominal pain and vomiting. Negative for diarrhea.  Genitourinary: Negative for frequency and hematuria.  Musculoskeletal: Negative for back pain.  Skin: Negative for rash.  Neurological: Negative for seizures and headaches.  Psychiatric/Behavioral: Negative for hallucinations.    Physical Exam Updated Vital Signs BP (!) 149/81   Pulse 95   Temp 98.5 F (36.9 C) (Oral)   Resp 18   Ht 5\' 3"  (1.6 m)   Wt 79.4 kg   SpO2 92%   BMI 31.00 kg/m   Physical Exam Vitals and nursing note reviewed.  Constitutional:      Appearance: She is well-developed.  HENT:     Head: Normocephalic.     Nose: Nose normal.  Eyes:     General: No scleral icterus.    Conjunctiva/sclera: Conjunctivae normal.  Neck:     Thyroid: No thyromegaly.  Cardiovascular:     Rate and Rhythm: Normal rate and regular rhythm.     Heart sounds: No murmur heard. No friction rub. No gallop.   Pulmonary:     Breath sounds: No stridor. No wheezing or rales.  Chest:     Chest wall: No tenderness.  Abdominal:     General:  There is no distension.     Tenderness: There is abdominal tenderness. There is no rebound.  Musculoskeletal:        General: Normal range of motion.     Cervical back: Neck supple.  Lymphadenopathy:     Cervical: No cervical adenopathy.  Skin:    Findings: No erythema or rash.  Neurological:     Mental Status: She is alert and oriented to person, place, and time.     Motor: No abnormal muscle tone.     Coordination: Coordination normal.  Psychiatric:        Behavior: Behavior normal.     ED Results / Procedures / Treatments   Labs (all labs ordered are listed, but only abnormal results are displayed) Labs Reviewed  CBC WITH DIFFERENTIAL/PLATELET - Abnormal; Notable for the following components:      Result Value   WBC 13.5 (*)    Neutro Abs 11.2 (*)    All other components within normal limits  COMPREHENSIVE METABOLIC PANEL - Abnormal; Notable for the following components:   Glucose, Bld 169 (*)    BUN 37 (*)    Creatinine, Ser 1.58 (*)    GFR, Estimated 35 (*)    All other components within normal limits  LIPASE, BLOOD  OCCULT BLOOD X 1 CARD TO LAB, STOOL  POC OCCULT BLOOD, ED    EKG None  Radiology CT ABDOMEN PELVIS WO CONTRAST  Result Date: 09/21/2020 CLINICAL DATA:  Abdominal abscess/infection suspected.  Vomiting. EXAM: CT ABDOMEN AND PELVIS WITHOUT CONTRAST TECHNIQUE: Multidetector CT imaging of the abdomen and pelvis was performed following the standard protocol without IV contrast. COMPARISON:  CT scan August 27, 2020 FINDINGS: Lower chest: There is a large hiatal hernia with the majority of the stomach located within the intrathoracic compartment. There is a small right-sided pleural effusion which is a little larger in the interval. There is atelectasis associated with the effusion in the hernia sac. No other abnormalities are identified in the lower chest. Hepatobiliary: No focal liver abnormality is seen. No gallstones, gallbladder wall thickening, or biliary  dilatation. Pancreas: Unremarkable. No pancreatic ductal dilatation or surrounding inflammatory changes. Spleen: Low-attenuation lesions in the spleen remains stable are  most consistent with a benign process such as cysts or hemangiomas. Adrenals/Urinary Tract: Adrenal glands are normal. A cyst is associated with the upper pole the right kidney, stable. No suspicious masses. Bilateral renal stones remain without perinephric stranding or hydronephrosis. No ureteral stones or ureterectasis identified. The bladder is largely obscured due to streak artifact off hip replacements but grossly unremarkable within visualized limits. Stomach/Bowel: Again noted is a large hiatal hernia as described above. The small bowel is normal. The colon is unchanged unremarkable with no evidence of inflammation. The appendix is normal in appearance. Vascular/Lymphatic: Calcified atherosclerosis is seen in the nonaneurysmal aorta. No adenopathy. Reproductive: Status post hysterectomy. No adnexal masses. Other: No abdominal wall hernia or abnormality. No abdominopelvic ascites. Musculoskeletal: The patient is status post bilateral hip replacements. Hardware is in good position. Degenerative changes are seen in the visualized lumbar spine and lower thoracic spine. IMPRESSION: 1. No acute abnormalities to explain the patient's symptoms. 2. There is a large hiatal hernia which is stable with the majority of the stomach located within the intrathoracic compartment. 3. There is a small right pleural effusion which is a little larger in the interval. 4. Nonobstructive renal stones. 5. Calcified atherosclerosis in the nonaneurysmal aorta. 6. No other acute abnormalities. Electronically Signed   By: Gerome Samavid  Williams III M.D   On: 09/21/2020 10:51    Procedures Procedures   Medications Ordered in ED Medications  pantoprazole (PROTONIX) injection 40 mg (40 mg Intravenous Given 09/21/20 0815)  sodium chloride 0.9 % bolus 500 mL (0 mLs  Intravenous Stopped 09/21/20 0943)  ondansetron (ZOFRAN) injection 4 mg (4 mg Intravenous Given 09/21/20 0821)  alum & mag hydroxide-simeth (MAALOX/MYLANTA) 200-200-20 MG/5ML suspension 30 mL (30 mLs Oral Given 09/21/20 1041)    And  lidocaine (XYLOCAINE) 2 % viscous mouth solution 15 mL (15 mLs Oral Given 09/21/20 1041)    ED Course  I have reviewed the triage vital signs and the nursing notes.  Pertinent labs & imaging results that were available during my care of the patient were reviewed by me and considered in my medical decision making (see chart for details).    MDM Rules/Calculators/A&P                          Patient with very large hiatal hernia.  I spoke with Dr. Karilyn Cotaehman  gastroenterologist and he suggested giving the patient Reglan 3 times a day along with Protonix twice a day and they will see her in the office in the next week Final Clinical Impression(s) / ED Diagnoses Final diagnoses:  Hiatal hernia    Rx / DC Orders ED Discharge Orders         Ordered    metoCLOPramide (REGLAN) 5 MG tablet  3 times daily before meals        09/21/20 1345    pantoprazole (PROTONIX) 40 MG tablet  2 times daily        09/21/20 1345           Bethann BerkshireZammit, Zynia Wojtowicz, MD 09/22/20 1642

## 2020-09-21 NOTE — ED Triage Notes (Signed)
Pt presents to ED with complaints of continued vomiting. Pt states she was seen here last night and sent home. Pt states she has vomited 3-4 times this morning.

## 2020-09-21 NOTE — ED Notes (Signed)
Pt states that she is feeling better, states that she has not vomited in the past 5 minutes, states that she is ready to go home, called family to come get her.  Pt from dpt via wc.

## 2020-09-21 NOTE — Discharge Instructions (Signed)
Get your medicines today at [facility] and the gastroenterologist office will call you with an earlier appointment

## 2020-09-27 ENCOUNTER — Telehealth: Payer: Self-pay | Admitting: Internal Medicine

## 2020-09-27 NOTE — Telephone Encounter (Signed)
Patient has an appointment to see me next month.  I received a message from Dr. Karilyn Cota that she has had 2 recent ER visits.  Can we add her to cancellation list and try to get her in sooner than 1 month with either myself or one of the [facility]Ps? Thanks

## 2020-09-28 NOTE — Telephone Encounter (Signed)
LMOM that we have an opening with Dr Marletta Lor on 5/25 and for her to call me back to confirm if she can come on this day.

## 2020-10-04 ENCOUNTER — Ambulatory Visit: Payer: PPO | Admitting: Internal Medicine

## 2020-10-04 ENCOUNTER — Encounter: Payer: Self-pay | Admitting: Internal Medicine

## 2020-10-04 VITALS — BP 141/72 | HR 77 | Temp 96.2°F | Ht 63.0 in | Wt 172.8 lb

## 2020-10-04 DIAGNOSIS — K449 Diaphragmatic hernia without obstruction or gangrene: Secondary | ICD-10-CM | POA: Diagnosis not present

## 2020-10-04 DIAGNOSIS — R112 Nausea with vomiting, unspecified: Secondary | ICD-10-CM | POA: Diagnosis not present

## 2020-10-04 DIAGNOSIS — R1319 Other dysphagia: Secondary | ICD-10-CM | POA: Diagnosis not present

## 2020-10-04 NOTE — Progress Notes (Signed)
Primary Care Physician:  Assunta Found, MD Primary Gastroenterologist:  Dr. Marletta Lor  Chief Complaint  Patient presents with  . Anemia    No bleeding, no dark stool. Had TCS 12/2018. Has had 2 iron infusions    HPI:   Gwendolyn Snyder is a 70 y.o. female who presents to clinic today by referral from Dr. Wolfgang Phoenix for evaluation.  Patient states April 17 she had sudden onset intractable nausea and vomiting, epigastric pain.  She presented to the ER and was treated with supportive care and discharged home.  She had 2 subsequent episodes both on 5/11 and 5/12 necessitating ER visits as well.  She has CT abdomen pelvis her most recent visit which was relatively unremarkable besides large hiatal hernia.  Patient states she is doing somewhat better since that time.  She was initially started on omeprazole but states this did not agree with her.  She then went to pantoprazole twice daily and took it for as long as she could though it caused her diarrhea and she currently stopped it as well.  No melena hematochezia.  No hematemesis.  Does note a few of her episodes seem to be coffee-ground.  Had a colonoscopy by Dr. Lovell Sheehan in 2020 which was relatively unremarkable.  No unintentional weight loss.  Does note feeling of food getting stuck in her epigastric region at times.  Past Medical History:  Diagnosis Date  . Anemia   . Anxiety   . Arthritis   . Bilateral carpal tunnel syndrome    right hand- surgery , left hand has not and left hand goes to sleep   . Cataracts, bilateral   . Complication of anesthesia    once after surgery felt like she could not breathe- 11/09/15- surgery greater than 11 years ago  . COPD (chronic obstructive pulmonary disease) (HCC)   . Diabetes mellitus without complication (HCC)    diet controlled , type II   . Family history of adverse reaction to anesthesia    sister- confused  . Glaucoma   . History of hiatal hernia   . History of kidney stones   . Hypertension   .  Renal disorder   . Shortness of breath dyspnea    with exertion    Past Surgical History:  Procedure Laterality Date  . ABDOMINAL HYSTERECTOMY    . CARPAL TUNNEL RELEASE Right   . COLONOSCOPY    . COLONOSCOPY N/A 01/05/2019   Procedure: COLONOSCOPY;  Surgeon: Franky Macho, MD;  [facility]: [facility];  Service: Gastroenterology;  Laterality: N/A;  . CYSTOSCOPY/RETROGRADE/URETEROSCOPY/STONE EXTRACTION WITH BASKET Left 09/17/2016   Procedure: CYSTOSCOPY/RETROGRADE/URETEROSCOPY/STONE EXTRACTION WITH BASKET/ STENT REMOVAL;  Surgeon: Bjorn Pippin, MD;  Location: [facility];  Service: Urology;  Laterality: Left;  . EXTRACORPOREAL SHOCK WAVE LITHOTRIPSY Left 11/16/2018   Procedure: EXTRACORPOREAL SHOCK WAVE LITHOTRIPSY (ESWL);  Surgeon: Crista Elliot, MD;  Location: [facility];  Service: Urology;  Laterality: Left;  . EXTRACORPOREAL SHOCK WAVE LITHOTRIPSY Left 01/21/2019   Procedure: EXTRACORPOREAL SHOCK WAVE LITHOTRIPSY (ESWL);  Surgeon: Bjorn Pippin, MD;  Location: [facility];  Service: Urology;  Laterality: Left;  . IR URETERAL STENT LEFT NEW ACCESS W/O SEP NEPHROSTOMY CATH  08/20/2016  . JOINT REPLACEMENT    . NEPHROLITHOTOMY Left 08/20/2016   Procedure: LEFT NEPHROLITHOTOMY PERCUTANEOUS FIRST STAGE;  Surgeon: Bjorn Pippin, MD;  Location: [facility];  Service: Urology;  Laterality: Left;  . NEPHROLITHOTOMY Left 08/22/2016   Procedure: LEFT NEPHROLITHOTOMY PERCUTANEOUS SECOND LOOK;  Surgeon: Bjorn Pippin,  MD;  Location: [facility];  Service: Urology;  Laterality: Left;  . Removal kidney stone    . TOTAL HIP ARTHROPLASTY Bilateral   . TOTAL HIP REVISION Left 11/22/2015   Procedure: Revision Left Total Hip Arthroplasty, Poly and Newman Pies Exchange;  Surgeon: Eldred Manges, MD;  Location: [facility] OR;  Service: Orthopedics;  Laterality: Left;  . TOTAL KNEE ARTHROPLASTY Right     Current Outpatient Medications  Medication Sig Dispense Refill  . acetaminophen (TYLENOL) 650 MG CR tablet Take 1,300 mg by mouth 2 (two) times daily.     Marland Kitchen allopurinol (ZYLOPRIM) 300 MG tablet Take 1 tablet (300 mg total) by mouth every evening. 90 tablet 3  . amLODipine (NORVASC) 10 MG tablet Take 1 tablet by mouth daily.    . cholecalciferol (VITAMIN D) 1000 units tablet Take 1,000 Units by mouth daily.    . Coenzyme Q10 (CO Q 10 PO) Take by mouth daily.    . cyanocobalamin (,VITAMIN B-12,) 1000 MCG/ML injection Inject as directed every 30 (thirty) days    . ezetimibe (ZETIA) 10 MG tablet Take 10 mg by mouth daily.    Marland Kitchen FREESTYLE LITE test strip Use to test blood sugar once daily    . Inositol Niacinate 500 MG CAPS Take 500 mg by mouth 2 (two) times daily.     Marland Kitchen lisinopril (PRINIVIL,ZESTRIL) 20 MG tablet Take 20 mg by mouth 2 (two) times daily.     . metoCLOPramide (REGLAN) 5 MG tablet Take 2 tablets (10 mg total) by mouth 3 (three) times daily before meals. 30 tablet 1  . omega-3 acid ethyl esters (LOVAZA) 1 g capsule Take 1 g by mouth 2 (two) times daily.     Marland Kitchen oxyCODONE-acetaminophen (PERCOCET/ROXICET) 5-325 MG tablet Take 1-2 tablets by mouth every 4 (four) hours as needed for severe pain.    . pantoprazole (PROTONIX) 40 MG tablet Take 1 tablet (40 mg total) by mouth 2 (two) times daily. 60 tablet 1  . Potassium Citrate 15 MEQ (1620 MG) TBCR Take 2 tabs qam and 1 tab qpm po 270 tablet 3  . rosuvastatin (CRESTOR) 5 MG tablet Take 5 mg by mouth at bedtime.    . sitaGLIPtin (JANUVIA) 100 MG tablet Take 100 mg by mouth every other day.      No current facility-administered medications for this visit.    Allergies as of 10/04/2020 - Review Complete 10/04/2020  Allergen Reaction Noted  . Aspirin Other (See Comments) 09/28/2015  . Codeine Other (See Comments) 09/28/2015  . Crestor [rosuvastatin calcium]  12/30/2018  . Flomax [tamsulosin hcl] Nausea And Vomiting 01/13/2019  . Pravastatin  09/10/2019    No family history on file.  Social History   Socioeconomic History  . Marital status: Single    Spouse name: Not on file  . Number  of children: Not on file  . Years of education: Not on file  . Highest education level: Not on file  Occupational History  . Not on file  Tobacco Use  . Smoking status: Former Smoker    Packs/day: 1.00    Years: 30.00    Pack years: 30.00    Types: Cigarettes    Quit date: 05/13/2004    Years since quitting: 16.4  . Smokeless tobacco: Never Used  Vaping Use  . Vaping Use: Never used  Substance and Sexual Activity  . Alcohol use: No    Alcohol/week: 0.0 standard drinks  . Drug use: No  . Sexual activity: Not on  file  Other Topics Concern  . Not on file  Social History Narrative  . Not on file   Social Determinants of Health   Financial Resource Strain: Not on file  Food Insecurity: Not on file  Transportation Needs: Not on file  Physical Activity: Not on file  Stress: Not on file  Social Connections: Not on file  Intimate Partner Violence: Not on file    Subjective: Review of Systems  Constitutional: Negative for chills and fever.  HENT: Negative for congestion and hearing loss.   Eyes: Negative for blurred vision and double vision.  Respiratory: Negative for cough and shortness of breath.   Cardiovascular: Negative for chest pain and palpitations.  Gastrointestinal: Positive for nausea and vomiting. Negative for abdominal pain, blood in stool, constipation, diarrhea, heartburn and melena.  Genitourinary: Negative for dysuria and urgency.  Musculoskeletal: Negative for joint pain and myalgias.  Skin: Negative for itching and rash.  Neurological: Negative for dizziness and headaches.  Psychiatric/Behavioral: Negative for depression. The patient is not nervous/anxious.        Objective: BP (!) 141/72   Pulse 77   Temp (!) 96.2 F (35.7 C) (Temporal)   Ht 5\' 3"  (1.6 m)   Wt 172 lb 12.8 oz (78.4 kg)   BMI 30.61 kg/m  Physical Exam Constitutional:      Appearance: Normal appearance.  HENT:     Head: Normocephalic and atraumatic.  Eyes:     Extraocular  Movements: Extraocular movements intact.     Conjunctiva/sclera: Conjunctivae normal.  Cardiovascular:     Rate and Rhythm: Normal rate and regular rhythm.  Pulmonary:     Effort: Pulmonary effort is normal.     Breath sounds: Normal breath sounds.  Abdominal:     General: Bowel sounds are normal.     Palpations: Abdomen is soft.  Musculoskeletal:        General: No swelling. Normal range of motion.     Cervical back: Normal range of motion and neck supple.  Skin:    General: Skin is warm and dry.     Coloration: Skin is not jaundiced.  Neurological:     General: No focal deficit present.     Mental Status: She is alert and oriented to person, place, and time.  Psychiatric:        Mood and Affect: Mood normal.        Behavior: Behavior normal.      Assessment: *Nausea and vomiting *Hiatal hernia *Dysphagia  Plan: I pulled up CT scan and showed patient pictures.  Does appear that she has a large hiatal hernia which could be the cause of her problems.    That being said, this has been stable for some time so we will  schedule for EGD to evaluate for peptic ulcer disease, esophagitis, gastritis, H. Pylori, duodenitis, or other. Will also evaluate for esophageal stricture, Schatzki's ring, esophageal web or other.   The risks including infection, bleed, or perforation as well as benefits, limitations, alternatives and imponderables have been reviewed with the patient. Potential for esophageal dilation, biopsy, etc. have also been reviewed.  Questions have been answered. All parties agreeable.  We will hold off on further PPI therapy for now until endoscopic evaluation.  Thank you Dr. for the kind referral  10/04/2020 10:51 AM   Disclaimer: This note was dictated with voice recognition software. Similar sounding words can inadvertently be transcribed and may not be corrected upon review.

## 2020-10-04 NOTE — Patient Instructions (Signed)
We will schedule you today for upper endoscopy to further evaluate your GI symptoms.  We may need to consider hiatal hernia repair depending on EGD findings.  Further recommendations to follow.  At [facility] we value your feedback. You may receive a survey about your visit today. Please share your experience as we strive to create trusting relationships with our patients to provide genuine, compassionate, quality care.  We appreciate your understanding and patience as we review any laboratory studies, imaging, and other diagnostic tests that are ordered as we care for you. Our office policy is 5 business days for review of these results, and any emergent or urgent results are addressed in a timely manner for your best interest. If you do not hear from our office in 1 week, please contact us.   We also encourage the use of MyChart, which contains your medical information for your review as well. If you are not enrolled in this feature, an access code is on this after visit summary for your convenience. Thank you for allowing [place] to be involved in your care.  It was great to see you today!  I hope you have a great rest of your spring!!    Hennie Duos. Marletta Lor, D.O. Gastroenterology and Hepatology [facility]

## 2020-10-04 NOTE — H&P (View-Only) (Signed)
Primary Care Physician:  Assunta Found, MD Primary Gastroenterologist:  Dr. Marletta Lor  Chief Complaint  Patient presents with  . Anemia    No bleeding, no dark stool. Had TCS 12/2018. Has had 2 iron infusions    HPI:   Gwendolyn Snyder is a 70 y.o. female who presents to clinic today by referral from Dr. Wolfgang Phoenix for evaluation.  Patient states April 17 she had sudden onset intractable nausea and vomiting, epigastric pain.  She presented to the ER and was treated with supportive care and discharged home.  She had 2 subsequent episodes both on 5/11 and 5/12 necessitating ER visits as well.  She has CT abdomen pelvis her most recent visit which was relatively unremarkable besides large hiatal hernia.  Patient states she is doing somewhat better since that time.  She was initially started on omeprazole but states this did not agree with her.  She then went to pantoprazole twice daily and took it for as long as she could though it caused her diarrhea and she currently stopped it as well.  No melena hematochezia.  No hematemesis.  Does note a few of her episodes seem to be coffee-ground.  Had a colonoscopy by Dr. Lovell Sheehan in 2020 which was relatively unremarkable.  No unintentional weight loss.  Does note feeling of food getting stuck in her epigastric region at times.  Past Medical History:  Diagnosis Date  . Anemia   . Anxiety   . Arthritis   . Bilateral carpal tunnel syndrome    right hand- surgery , left hand has not and left hand goes to sleep   . Cataracts, bilateral   . Complication of anesthesia    once after surgery felt like she could not breathe- 11/09/15- surgery greater than 11 years ago  . COPD (chronic obstructive pulmonary disease) (HCC)   . Diabetes mellitus without complication (HCC)    diet controlled , type II   . Family history of adverse reaction to anesthesia    sister- confused  . Glaucoma   . History of hiatal hernia   . History of kidney stones   . Hypertension   .  Renal disorder   . Shortness of breath dyspnea    with exertion    Past Surgical History:  Procedure Laterality Date  . ABDOMINAL HYSTERECTOMY    . CARPAL TUNNEL RELEASE Right   . COLONOSCOPY    . COLONOSCOPY N/A 01/05/2019   Procedure: COLONOSCOPY;  Surgeon: Franky Macho, MD;  Location: [facility];  Service: Gastroenterology;  Laterality: N/A;  . CYSTOSCOPY/RETROGRADE/URETEROSCOPY/STONE EXTRACTION WITH BASKET Left 09/17/2016   Procedure: CYSTOSCOPY/RETROGRADE/URETEROSCOPY/STONE EXTRACTION WITH BASKET/ STENT REMOVAL;  Surgeon: Bjorn Pippin, MD;  Location: [facility];  Service: Urology;  Laterality: Left;  . EXTRACORPOREAL SHOCK WAVE LITHOTRIPSY Left 11/16/2018   Procedure: EXTRACORPOREAL SHOCK WAVE LITHOTRIPSY (ESWL);  Surgeon: Crista Elliot, MD;  Location: [facility];  Service: Urology;  Laterality: Left;  . EXTRACORPOREAL SHOCK WAVE LITHOTRIPSY Left 01/21/2019   Procedure: EXTRACORPOREAL SHOCK WAVE LITHOTRIPSY (ESWL);  Surgeon: Bjorn Pippin, MD;  Location: [facility];  Service: Urology;  Laterality: Left;  . IR URETERAL STENT LEFT NEW ACCESS W/O SEP NEPHROSTOMY CATH  08/20/2016  . JOINT REPLACEMENT    . NEPHROLITHOTOMY Left 08/20/2016   Procedure: LEFT NEPHROLITHOTOMY PERCUTANEOUS FIRST STAGE;  Surgeon: Bjorn Pippin, MD;  Location: [facility];  Service: Urology;  Laterality: Left;  . NEPHROLITHOTOMY Left 08/22/2016   Procedure: LEFT NEPHROLITHOTOMY PERCUTANEOUS SECOND LOOK;  Surgeon: Bjorn Pippin,  MD;  Location: [facility];  Service: Urology;  Laterality: Left;  . Removal kidney stone    . TOTAL HIP ARTHROPLASTY Bilateral   . TOTAL HIP REVISION Left 11/22/2015   Procedure: Revision Left Total Hip Arthroplasty, Poly and Newman Pies Exchange;  Surgeon: Eldred Manges, MD;  Location: [facility] OR;  Service: Orthopedics;  Laterality: Left;  . TOTAL KNEE ARTHROPLASTY Right     Current Outpatient Medications  Medication Sig Dispense Refill  . acetaminophen (TYLENOL) 650 MG CR tablet Take 1,300 mg by mouth 2 (two) times daily.     Marland Kitchen allopurinol (ZYLOPRIM) 300 MG tablet Take 1 tablet (300 mg total) by mouth every evening. 90 tablet 3  . amLODipine (NORVASC) 10 MG tablet Take 1 tablet by mouth daily.    . cholecalciferol (VITAMIN D) 1000 units tablet Take 1,000 Units by mouth daily.    . Coenzyme Q10 (CO Q 10 PO) Take by mouth daily.    . cyanocobalamin (,VITAMIN B-12,) 1000 MCG/ML injection Inject as directed every 30 (thirty) days    . ezetimibe (ZETIA) 10 MG tablet Take 10 mg by mouth daily.    Marland Kitchen FREESTYLE LITE test strip Use to test blood sugar once daily    . Inositol Niacinate 500 MG CAPS Take 500 mg by mouth 2 (two) times daily.     Marland Kitchen lisinopril (PRINIVIL,ZESTRIL) 20 MG tablet Take 20 mg by mouth 2 (two) times daily.     . metoCLOPramide (REGLAN) 5 MG tablet Take 2 tablets (10 mg total) by mouth 3 (three) times daily before meals. 30 tablet 1  . omega-3 acid ethyl esters (LOVAZA) 1 g capsule Take 1 g by mouth 2 (two) times daily.     Marland Kitchen oxyCODONE-acetaminophen (PERCOCET/ROXICET) 5-325 MG tablet Take 1-2 tablets by mouth every 4 (four) hours as needed for severe pain.    . pantoprazole (PROTONIX) 40 MG tablet Take 1 tablet (40 mg total) by mouth 2 (two) times daily. 60 tablet 1  . Potassium Citrate 15 MEQ (1620 MG) TBCR Take 2 tabs qam and 1 tab qpm po 270 tablet 3  . rosuvastatin (CRESTOR) 5 MG tablet Take 5 mg by mouth at bedtime.    . sitaGLIPtin (JANUVIA) 100 MG tablet Take 100 mg by mouth every other day.      No current facility-administered medications for this visit.    Allergies as of 10/04/2020 - Review Complete 10/04/2020  Allergen Reaction Noted  . Aspirin Other (See Comments) 09/28/2015  . Codeine Other (See Comments) 09/28/2015  . Crestor [rosuvastatin calcium]  12/30/2018  . Flomax [tamsulosin hcl] Nausea And Vomiting 01/13/2019  . Pravastatin  09/10/2019    No family history on file.  Social History   Socioeconomic History  . Marital status: Single    Spouse name: Not on file  . Number  of children: Not on file  . Years of education: Not on file  . Highest education level: Not on file  Occupational History  . Not on file  Tobacco Use  . Smoking status: Former Smoker    Packs/day: 1.00    Years: 30.00    Pack years: 30.00    Types: Cigarettes    Quit date: 05/13/2004    Years since quitting: 16.4  . Smokeless tobacco: Never Used  Vaping Use  . Vaping Use: Never used  Substance and Sexual Activity  . Alcohol use: No    Alcohol/week: 0.0 standard drinks  . Drug use: No  . Sexual activity: Not on  file  Other Topics Concern  . Not on file  Social History Narrative  . Not on file   Social Determinants of Health   Financial Resource Strain: Not on file  Food Insecurity: Not on file  Transportation Needs: Not on file  Physical Activity: Not on file  Stress: Not on file  Social Connections: Not on file  Intimate Partner Violence: Not on file    Subjective: Review of Systems  Constitutional: Negative for chills and fever.  HENT: Negative for congestion and hearing loss.   Eyes: Negative for blurred vision and double vision.  Respiratory: Negative for cough and shortness of breath.   Cardiovascular: Negative for chest pain and palpitations.  Gastrointestinal: Positive for nausea and vomiting. Negative for abdominal pain, blood in stool, constipation, diarrhea, heartburn and melena.  Genitourinary: Negative for dysuria and urgency.  Musculoskeletal: Negative for joint pain and myalgias.  Skin: Negative for itching and rash.  Neurological: Negative for dizziness and headaches.  Psychiatric/Behavioral: Negative for depression. The patient is not nervous/anxious.        Objective: BP (!) 141/72   Pulse 77   Temp (!) 96.2 F (35.7 C) (Temporal)   Ht 5\' 3"  (1.6 m)   Wt 172 lb 12.8 oz (78.4 kg)   BMI 30.61 kg/m  Physical Exam Constitutional:      Appearance: Normal appearance.  HENT:     Head: Normocephalic and atraumatic.  Eyes:     Extraocular  Movements: Extraocular movements intact.     Conjunctiva/sclera: Conjunctivae normal.  Cardiovascular:     Rate and Rhythm: Normal rate and regular rhythm.  Pulmonary:     Effort: Pulmonary effort is normal.     Breath sounds: Normal breath sounds.  Abdominal:     General: Bowel sounds are normal.     Palpations: Abdomen is soft.  Musculoskeletal:        General: No swelling. Normal range of motion.     Cervical back: Normal range of motion and neck supple.  Skin:    General: Skin is warm and dry.     Coloration: Skin is not jaundiced.  Neurological:     General: No focal deficit present.     Mental Status: She is alert and oriented to person, place, and time.  Psychiatric:        Mood and Affect: Mood normal.        Behavior: Behavior normal.      Assessment: *Nausea and vomiting *Hiatal hernia *Dysphagia  Plan: I pulled up CT scan and showed patient pictures.  Does appear that she has a large hiatal hernia which could be the cause of her problems.    That being said, this has been stable for some time so we will  schedule for EGD to evaluate for peptic ulcer disease, esophagitis, gastritis, H. Pylori, duodenitis, or other. Will also evaluate for esophageal stricture, Schatzki's ring, esophageal web or other.   The risks including infection, bleed, or perforation as well as benefits, limitations, alternatives and imponderables have been reviewed with the patient. Potential for esophageal dilation, biopsy, etc. have also been reviewed.  Questions have been answered. All parties agreeable.  We will hold off on further PPI therapy for now until endoscopic evaluation.  Thank you Dr. for the kind referral  10/04/2020 10:51 AM   Disclaimer: This note was dictated with voice recognition software. Similar sounding words can inadvertently be transcribed and may not be corrected upon review.

## 2020-10-05 ENCOUNTER — Telehealth: Payer: Self-pay

## 2020-10-05 NOTE — Telephone Encounter (Signed)
Called pt, EGD/DIL w/Propofol ASA 3 w/Dr. Marletta Lor scheduled for 10/24/20 at 2:15pm. Offered procedure 10/10/20 but she already has another appt. Orders entered.

## 2020-10-05 NOTE — Telephone Encounter (Signed)
Pre-op appt 10/19/20. Appt letter mailed with procedure instructions.

## 2020-10-10 DIAGNOSIS — E1122 Type 2 diabetes mellitus with diabetic chronic kidney disease: Secondary | ICD-10-CM | POA: Diagnosis not present

## 2020-10-10 DIAGNOSIS — N182 Chronic kidney disease, stage 2 (mild): Secondary | ICD-10-CM | POA: Diagnosis not present

## 2020-10-10 DIAGNOSIS — E7849 Other hyperlipidemia: Secondary | ICD-10-CM | POA: Diagnosis not present

## 2020-10-11 DIAGNOSIS — D510 Vitamin B12 deficiency anemia due to intrinsic factor deficiency: Secondary | ICD-10-CM | POA: Diagnosis not present

## 2020-10-17 NOTE — Patient Instructions (Signed)
Gwendolyn Snyder  10/17/2020     @PREFPERIOPPHARMACY @   Your procedure is scheduled on 10/24/2020.    Report to [facility] at  1200  P.M.   Call this number if you have problems the morning of surgery:  [phone]   Remember:  Follow the diet instructions given to you by the office.                     Take these medicines the morning of surgery with A SIP OF WATER   Amlodipine.  DO NOT take any medications for diabetes the morning of your procedure.     Please brush  Your teeth.  Do not wear jewelry, make-up or nail polish.  Do not wear lotions, powders, or perfumes, or deodorant.  Do not shave 48 hours prior to surgery.  Men may shave face and neck.  Do not bring valuables to the hospital.  [facility] is not responsible for any belongings or valuables.  Contacts, dentures or bridgework may not be worn into surgery.  Leave your suitcase in the car.  After surgery it may be brought to your room.  For patients admitted to the hospital, discharge time will be determined by your treatment team.  Patients discharged the day of surgery will not be allowed to drive home and must have someone with them for 24 hours.    Special instructions:  DO NOT smoke tobacco or vape for 24 hours before your procedure.  Please read over the following fact sheets that you were given. Anesthesia Post-op Instructions and Care and Recovery After Surgery       Upper Endoscopy, Adult, Care After This sheet gives you information about how to care for yourself after your procedure. Your health care provider may also give you more specific instructions. If you have problems or questions, contact your health care provider. What can I expect after the procedure? After the procedure, it is common to have:  A sore throat.  Mild stomach pain or discomfort.  Bloating.  Nausea. Follow these instructions at home:  Follow instructions from your health care provider about what to  eat or drink after your procedure.  Return to your normal activities as told by your health care provider. Ask your health care provider what activities are safe for you.  Take over-the-counter and prescription medicines only as told by your health care provider.  If you were given a sedative during the procedure, it can affect you for several hours. Do not drive or operate machinery until your health care provider says that it is safe.  Keep all follow-up visits as told by your health care provider. This is important.   Contact a health care provider if you have:  A sore throat that lasts longer than one day.  Trouble swallowing. Get help right away if:  You vomit blood or your vomit looks like coffee grounds.  You have: ? A fever. ? Bloody, black, or tarry stools. ? A severe sore throat or you cannot swallow. ? Difficulty breathing. ? Severe pain in your chest or abdomen. Summary  After the procedure, it is common to have a sore throat, mild stomach discomfort, bloating, and nausea.  If you were given a sedative during the procedure, it can affect you for several hours. Do not drive or operate machinery until your health care provider says that it is safe.  Follow instructions from your health  care provider about what to eat or drink after your procedure.  Return to your normal activities as told by your health care provider. This information is not intended to replace advice given to you by your health care provider. Make sure you discuss any questions you have with your health care provider. Document Revised: 04/27/2019 Document Reviewed: 09/29/2017 Elsevier Patient Education  2021 Elsevier Inc.  https://www.asge.org/home/for-patients/patient-information/understanding-eso-dilation-updated">  Esophageal Dilatation Esophageal dilatation, also called esophageal dilation, is a procedure to widen or open a blocked or narrowed part of the esophagus. The esophagus is the part of  the body that moves food and liquid from the mouth to the stomach. You may need this procedure if:  You have a buildup of scar tissue in your esophagus that makes it difficult, painful, or impossible to swallow. This can be caused by gastroesophageal reflux disease (GERD).  You have cancer of the esophagus.  There is a problem with how food moves through your esophagus. In some cases, you may need this procedure repeated at a later time to dilate the esophagus gradually. Tell a health care provider about:  Any allergies you have.  All medicines you are taking, including vitamins, herbs, eye drops, creams, and over-the-counter medicines.  Any problems you or family members have had with anesthetic medicines.  Any blood disorders you have.  Any surgeries you have had.  Any medical conditions you have.  Any antibiotic medicines you are required to take before dental procedures.  Whether you are pregnant or may be pregnant. What are the risks? Generally, this is a safe procedure. However, problems may occur, including:  Bleeding due to a tear in the lining of the esophagus.  A hole, or perforation, in the esophagus. What happens before the procedure?  Ask your health care provider about: ? Changing or stopping your regular medicines. This is especially important if you are taking diabetes medicines or blood thinners. ? Taking medicines such as aspirin and ibuprofen. These medicines can thin your blood. Do not take these medicines unless your health care provider tells you to take them. ? Taking over-the-counter medicines, vitamins, herbs, and supplements.  Follow instructions from your health care provider about eating or drinking restrictions.  Plan to have a responsible adult take you home from the hospital or clinic.  Plan to have a responsible adult care for you for the time you are told after you leave the hospital or clinic. This is important. What happens during the  procedure?  You may be given a medicine to help you relax (sedative).  A numbing medicine may be sprayed into the back of your throat, or you may gargle the medicine.  Your health care provider may perform the dilatation using various surgical instruments, such as: ? Simple dilators. This instrument is carefully placed in the esophagus to stretch it. ? Guided wire bougies. This involves using an endoscope to insert a wire into the esophagus. A dilator is passed over this wire to enlarge the esophagus. Then the wire is removed. ? Balloon dilators. An endoscope with a small balloon is inserted into the esophagus. The balloon is inflated to stretch the esophagus and open it up. The procedure may vary among health care providers and hospitals. What can I expect after the procedure?  Your blood pressure, heart rate, breathing rate, and blood oxygen level will be monitored until you leave the hospital or clinic.  Your throat may feel slightly sore and numb. This will get better over time.  You will  not be allowed to eat or drink until your throat is no longer numb.  When you are able to drink, urinate, and sit on the edge of the bed without nausea or dizziness, you may be able to return home. Follow these instructions at home:  Take over-the-counter and prescription medicines only as told by your health care provider.  If you were given a sedative during the procedure, it can affect you for several hours. Do not drive or operate machinery until your health care provider says that it is safe.  Plan to have a responsible adult care for you for the time you are told. This is important.  Follow instructions from your health care provider about any eating or drinking restrictions.  Do not use any products that contain nicotine or tobacco, such as cigarettes, e-cigarettes, and chewing tobacco. If you need help quitting, ask your health care provider.  Keep all follow-up visits. This is  important. Contact a health care provider if:  You have a fever.  You have pain that is not relieved by medicine. Get help right away if:  You have chest pain.  You have trouble breathing.  You have trouble swallowing.  You vomit blood.  You have black, tarry, or bloody stools. These symptoms may represent a serious problem that is an emergency. Do not wait to see if the symptoms will go away. Get medical help right away. Call your local emergency services (911 in the [place] Do not drive yourself to the hospital. Summary  Esophageal dilatation, also called esophageal dilation, is a procedure to widen or open a blocked or narrowed part of the esophagus.  Plan to have a responsible adult take you home from the hospital or clinic.  For this procedure, a numbing medicine may be sprayed into the back of your throat, or you may gargle the medicine.  Do not drive or operate machinery until your health care provider says that it is safe. This information is not intended to replace advice given to you by your health care provider. Make sure you discuss any questions you have with your health care provider. Document Revised: 09/15/2019 Document Reviewed: 09/15/2019 Elsevier Patient Education  2021 Elsevier Inc.  Monitored Anesthesia Care, Care After This sheet gives you information about how to care for yourself after your procedure. Your health care provider may also give you more specific instructions. If you have problems or questions, contact your health care provider. What can I expect after the procedure? After the procedure, it is common to have:  Tiredness.  Forgetfulness about what happened after the procedure.  Impaired judgment for important decisions.  Nausea or vomiting.  Some difficulty with balance. Follow these instructions at home: For the time period you were told by your health care provider:  Rest as needed.  Do not participate in activities where you  could fall or become injured.  Do not drive or use machinery.  Do not drink alcohol.  Do not take sleeping pills or medicines that cause drowsiness.  Do not make important decisions or sign legal documents.  Do not take care of children on your own.      Eating and drinking  Follow the diet that is recommended by your health care provider.  Drink enough fluid to keep your urine pale yellow.  If you vomit: ? Drink water, juice, or soup when you can drink without vomiting. ? Make sure you have little or no nausea before eating solid foods. General instructions  Have a responsible adult stay with you for the time you are told. It is important to have someone help care for you until you are awake and alert.  Take over-the-counter and prescription medicines only as told by your health care provider.  If you have sleep apnea, surgery and certain medicines can increase your risk for breathing problems. Follow instructions from your health care provider about wearing your sleep device: ? Anytime you are sleeping, including during daytime naps. ? While taking prescription pain medicines, sleeping medicines, or medicines that make you drowsy.  Avoid smoking.  Keep all follow-up visits as told by your health care provider. This is important. Contact a health care provider if:  You keep feeling nauseous or you keep vomiting.  You feel light-headed.  You are still sleepy or having trouble with balance after 24 hours.  You develop a rash.  You have a fever.  You have redness or swelling around the IV site. Get help right away if:  You have trouble breathing.  You have new-onset confusion at home. Summary  For several hours after your procedure, you may feel tired. You may also be forgetful and have poor judgment.  Have a responsible adult stay with you for the time you are told. It is important to have someone help care for you until you are awake and alert.  Rest as  told. Do not drive or operate machinery. Do not drink alcohol or take sleeping pills.  Get help right away if you have trouble breathing, or if you suddenly become confused. This information is not intended to replace advice given to you by your health care provider. Make sure you discuss any questions you have with your health care provider. Document Revised: 01/13/2020 Document Reviewed: 04/01/2019 Elsevier Patient Education  2021 [employer]

## 2020-10-19 ENCOUNTER — Encounter (HOSPITAL_COMMUNITY)
Admission: RE | Admit: 2020-10-19 | Discharge: 2020-10-19 | Disposition: A | Discharge status: Home / Self Care | Payer: PPO | Source: Ambulatory Visit | Attending: Internal Medicine | Admitting: Internal Medicine

## 2020-10-19 ENCOUNTER — Other Ambulatory Visit: Payer: Self-pay

## 2020-10-19 ENCOUNTER — Encounter (HOSPITAL_COMMUNITY): Payer: Self-pay

## 2020-10-24 ENCOUNTER — Ambulatory Visit (HOSPITAL_COMMUNITY)
Admission: RE | Admit: 2020-10-24 | Discharge: 2020-10-24 | Disposition: A | Discharge status: Home / Self Care | Payer: PPO | Attending: Internal Medicine | Admitting: Internal Medicine

## 2020-10-24 ENCOUNTER — Encounter (HOSPITAL_COMMUNITY)
Admission: RE | Disposition: A | Discharge status: Home / Self Care | Payer: Self-pay | Source: Home / Self Care | Attending: Internal Medicine

## 2020-10-24 ENCOUNTER — Encounter (HOSPITAL_COMMUNITY): Payer: Self-pay

## 2020-10-24 ENCOUNTER — Ambulatory Visit (HOSPITAL_COMMUNITY): Payer: PPO | Admitting: Certified Registered Nurse Anesthetist

## 2020-10-24 DIAGNOSIS — Z7984 Long term (current) use of oral hypoglycemic drugs: Secondary | ICD-10-CM | POA: Insufficient documentation

## 2020-10-24 DIAGNOSIS — Z888 Allergy status to other drugs, medicaments and biological substances status: Secondary | ICD-10-CM | POA: Insufficient documentation

## 2020-10-24 DIAGNOSIS — Z96651 Presence of right artificial knee joint: Secondary | ICD-10-CM | POA: Insufficient documentation

## 2020-10-24 DIAGNOSIS — K2950 Unspecified chronic gastritis without bleeding: Secondary | ICD-10-CM | POA: Insufficient documentation

## 2020-10-24 DIAGNOSIS — K2970 Gastritis, unspecified, without bleeding: Secondary | ICD-10-CM | POA: Diagnosis not present

## 2020-10-24 DIAGNOSIS — J449 Chronic obstructive pulmonary disease, unspecified: Secondary | ICD-10-CM | POA: Diagnosis not present

## 2020-10-24 DIAGNOSIS — Z90710 Acquired absence of both cervix and uterus: Secondary | ICD-10-CM | POA: Diagnosis not present

## 2020-10-24 DIAGNOSIS — Z96643 Presence of artificial hip joint, bilateral: Secondary | ICD-10-CM | POA: Insufficient documentation

## 2020-10-24 DIAGNOSIS — Z886 Allergy status to analgesic agent status: Secondary | ICD-10-CM | POA: Insufficient documentation

## 2020-10-24 DIAGNOSIS — R12 Heartburn: Secondary | ICD-10-CM | POA: Diagnosis not present

## 2020-10-24 DIAGNOSIS — K449 Diaphragmatic hernia without obstruction or gangrene: Secondary | ICD-10-CM | POA: Insufficient documentation

## 2020-10-24 DIAGNOSIS — E1136 Type 2 diabetes mellitus with diabetic cataract: Secondary | ICD-10-CM | POA: Insufficient documentation

## 2020-10-24 DIAGNOSIS — Z79899 Other long term (current) drug therapy: Secondary | ICD-10-CM | POA: Diagnosis not present

## 2020-10-24 DIAGNOSIS — I10 Essential (primary) hypertension: Secondary | ICD-10-CM | POA: Insufficient documentation

## 2020-10-24 DIAGNOSIS — Z885 Allergy status to narcotic agent status: Secondary | ICD-10-CM | POA: Diagnosis not present

## 2020-10-24 DIAGNOSIS — R110 Nausea: Secondary | ICD-10-CM | POA: Diagnosis not present

## 2020-10-24 DIAGNOSIS — Z87891 Personal history of nicotine dependence: Secondary | ICD-10-CM | POA: Insufficient documentation

## 2020-10-24 DIAGNOSIS — R1013 Epigastric pain: Secondary | ICD-10-CM | POA: Diagnosis present

## 2020-10-24 HISTORY — PX: BIOPSY: SHX5522

## 2020-10-24 HISTORY — PX: ESOPHAGOGASTRODUODENOSCOPY (EGD) WITH PROPOFOL: SHX5813

## 2020-10-24 LAB — GLUCOSE, CAPILLARY: Glucose-Capillary: 95 mg/dL (ref 70–99)

## 2020-10-24 SURGERY — ESOPHAGOGASTRODUODENOSCOPY (EGD) WITH PROPOFOL
Anesthesia: General

## 2020-10-24 MED ORDER — PANTOPRAZOLE SODIUM 40 MG PO TBEC: 40.0000 mg | DELAYED_RELEASE_TABLET | Freq: Every day | ORAL | 5 refills | Status: DC

## 2020-10-24 MED ORDER — PROPOFOL 500 MG/50ML IV EMUL
INTRAVENOUS | Status: DC | PRN
  Administered 2020-10-24: 125 ug/kg/min via INTRAVENOUS
  Administered 2020-10-24: 100 mg via INTRAVENOUS

## 2020-10-24 MED ORDER — LACTATED RINGERS IV SOLN: INTRAVENOUS | Status: DC

## 2020-10-24 MED ORDER — LIDOCAINE HCL (CARDIAC) PF 100 MG/5ML IV SOSY
PREFILLED_SYRINGE | INTRAVENOUS | Status: DC | PRN
  Administered 2020-10-24: 100 mg via INTRAVENOUS

## 2020-10-24 NOTE — Anesthesia Postprocedure Evaluation (Signed)
Anesthesia Post Note  Patient: Gwendolyn Snyder  Procedure(s) Performed: ESOPHAGOGASTRODUODENOSCOPY (EGD) WITH PROPOFOL BIOPSY  Patient location during evaluation: Phase II Anesthesia Type: General Level of consciousness: awake Pain management: pain level controlled Vital Signs Assessment: post-procedure vital signs reviewed and stable Respiratory status: spontaneous breathing and respiratory function stable Cardiovascular status: blood pressure returned to baseline and stable Postop Assessment: no headache and no apparent nausea or vomiting Anesthetic complications: no Comments: Late entry   No notable events documented.   Last Vitals:  Vitals:   10/24/20 1401 10/24/20 1411  BP: (!) 102/54 119/63  Pulse: 91   Resp: 18   Temp: 36.5 C   SpO2: 96% 95%    Last Pain:  Vitals:   10/24/20 1411  TempSrc:   PainSc: 0-No pain                 Windell Norfolk

## 2020-10-24 NOTE — Anesthesia Preprocedure Evaluation (Signed)
Anesthesia Evaluation  Patient identified by MRN, date of birth, ID band Patient awake    Reviewed: Allergy & Precautions, H&P , NPO status , Patient's Chart, lab work & pertinent test results, reviewed documented beta blocker date and time   Airway Mallampati: II  TM Distance: >3 FB Neck ROM: full    Dental no notable dental hx.    Pulmonary COPD, former smoker,    Pulmonary exam normal breath sounds clear to auscultation       Cardiovascular Exercise Tolerance: Good hypertension, negative cardio ROS   Rhythm:regular Rate:Normal     Neuro/Psych Anxiety  Neuromuscular disease negative psych ROS   GI/Hepatic Neg liver ROS, hiatal hernia,   Endo/Other  negative endocrine ROSdiabetes, Type 2  Renal/GU Renal disease  negative genitourinary   Musculoskeletal   Abdominal   Peds  Hematology  (+) Blood dyscrasia, anemia ,   Anesthesia Other Findings   Reproductive/Obstetrics negative OB ROS                             Anesthesia Physical Anesthesia Plan  ASA: 3  Anesthesia Plan: General   Post-op Pain Management:    Induction:   PONV Risk Score and Plan: Propofol infusion  Airway Management Planned:   Additional Equipment:   Intra-op Plan:   Post-operative Plan:   Informed Consent: I have reviewed the patients History and Physical, chart, labs and discussed the procedure including the risks, benefits and alternatives for the proposed anesthesia with the patient or authorized representative who has indicated his/her understanding and acceptance.     Dental Advisory Given  Plan Discussed with: CRNA  Anesthesia Plan Comments:         Anesthesia Quick Evaluation

## 2020-10-24 NOTE — Discharge Instructions (Addendum)
EGD Discharge instructions Please read the instructions outlined below and refer to this sheet in the next few weeks. These discharge instructions provide you with general information on caring for yourself after you leave the hospital. Your doctor may also give you specific instructions. While your treatment has been planned according to the most current medical practices available, unavoidable complications occasionally occur. If you have any problems or questions after discharge, please call your doctor. ACTIVITY You may resume your regular activity but move at a slower pace for the next 24 hours.  Take frequent rest periods for the next 24 hours.  Walking will help expel (get rid of) the air and reduce the bloated feeling in your abdomen.  No driving for 24 hours (because of the anesthesia (medicine) used during the test).  You may shower.  Do not sign any important legal documents or operate any machinery for 24 hours (because of the anesthesia used during the test).  NUTRITION Drink plenty of fluids.  You may resume your normal diet.  Begin with a light meal and progress to your normal diet.  Avoid alcoholic beverages for 24 hours or as instructed by your caregiver.  MEDICATIONS You may resume your normal medications unless your caregiver tells you otherwise.  WHAT YOU CAN EXPECT TODAY You may experience abdominal discomfort such as a feeling of fullness or "gas" pains.  FOLLOW-UP Your doctor will discuss the results of your test with you.  SEEK IMMEDIATE MEDICAL ATTENTION IF ANY OF THE FOLLOWING OCCUR: Excessive nausea (feeling sick to your stomach) and/or vomiting.  Severe abdominal pain and distention (swelling).  Trouble swallowing.  Temperature over 101 F (37.8 C).  Rectal bleeding or vomiting of blood.   Your EGD revealed a mild amount of inflammation in her stomach.  I took biopsies of this to rule out patient bacteria called H. pylori.  You also have a very large hiatal  hernia.  Approximately two thirds of your stomach is actually herniating into your chest cavity which is likely the cause of your symptoms.  Would recommend daily pantoprazole.  If this is not well controlled on PPI therapy, we may need to consider referral for hiatal hernia repair.  Follow-up with GI in 3 to 4 months.   I hope you have a great rest of your week!  Hennie Duos. Marletta Lor, D.O. Gastroenterology and Hepatology    [facility]

## 2020-10-24 NOTE — Interval H&P Note (Signed)
History and Physical Interval Note:  10/24/2020 1:40 PM  Gwendolyn Snyder  has presented today for surgery, with the diagnosis of dysphagia, nausea/vomiting, hiatal hernia.  The various methods of treatment have been discussed with the patient and family. After consideration of risks, benefits and other options for treatment, the patient has consented to  Procedure(s) with comments: ESOPHAGOGASTRODUODENOSCOPY (EGD) WITH PROPOFOL (N/A) - 2:15pm BALLOON DILATION (N/A) as a surgical intervention.  The patient's history has been reviewed, patient examined, no change in status, stable for surgery.  I have reviewed the patient's chart and labs.  Questions were answered to the patient's satisfaction.     [name]

## 2020-10-24 NOTE — Op Note (Signed)
[facility] Patient Name: Gwendolyn Snyder Procedure Date: 10/24/2020 1:37 PM MRN: [id number] Date of Birth: [birth year] Attending MD: Elon Alas. Abbey Chatters DO CSN: [id number] Age: 70 Admit Type: Outpatient Procedure:                Upper GI endoscopy Indications:              Epigastric abdominal pain, Heartburn, Nausea Providers:                Elon Alas. Abbey Chatters, DO, Gwenlyn Fudge, RN, Randa Spike, Technician Referring MD:              Medicines:                See the Anesthesia note for documentation of the                            administered medications Complications:            No immediate complications. Estimated Blood Loss:     Estimated blood loss was minimal. Procedure:                Pre-Anesthesia Assessment:                           - The anesthesia plan was to use monitored                            anesthesia care (MAC).                           After obtaining informed consent, the endoscope was                            passed under direct vision. Throughout the                            procedure, the patient's blood pressure, pulse, and                            oxygen saturations were monitored continuously. The                            [id number]-H190 ([id number]) scope was introduced through the                            mouth, and advanced to the second part of duodenum.                            The upper GI endoscopy was accomplished without                            difficulty. The patient tolerated the procedure                            well. Scope In:  1:50:00 PM Scope Out: 1:55:52 PM Total Procedure Duration: 0 hours 5 minutes 52 seconds  Findings:      There is no endoscopic evidence of bleeding, areas of erosion,       esophagitis, ulcerations or varices in the entire esophagus.      A large hiatal hernia containing approx 2/3 of the stomach was found.      Localized mild inflammation characterized by erythema was  found in the       gastric antrum. Biopsies were taken with a cold forceps for Helicobacter       pylori testing.      The duodenal bulb, first portion of the duodenum and second portion of       the duodenum were normal. Impression:               - Large hiatal hernia.                           - Gastritis. Biopsied.                           - Normal duodenal bulb, first portion of the                            duodenum and second portion of the duodenum. Moderate Sedation:      Per Anesthesia Care Recommendation:           - Patient has a contact number available for                            emergencies. The signs and symptoms of potential                            delayed complications were discussed with the                            patient. Return to normal activities tomorrow.                            Written discharge instructions were provided to the                            patient.                           - Resume previous diet.                           - Continue present medications.                           - Await pathology results.                           - Use Protonix (pantoprazole) 40 mg PO daily.                           - Return to GI clinic in 3 months.                           -  If symptoms not well controlled on PPI therapy,                            may need to consider hiatal hernia repair. Procedure Code(s):        --- Professional ---                           [phone], Esophagogastroduodenoscopy, flexible,                            transoral; with biopsy, single or multiple Diagnosis Code(s):        --- Professional ---                           K44.9, Diaphragmatic hernia without obstruction or                            gangrene                           K29.70, Gastritis, unspecified, without bleeding                           R10.13, Epigastric pain                           R12, Heartburn                           R11.0, Nausea CPT  copyright 2019 American Medical Association. All rights reserved. The codes documented in this report are preliminary and upon coder review may  be revised to meet current compliance requirements. Elon Alas. Abbey Chatters, DO [place] Abbey Chatters, DO 10/24/2020 2:00:35 PM This report has been signed electronically. Number of Addenda: 0

## 2020-10-24 NOTE — Transfer of Care (Signed)
Immediate Anesthesia Transfer of Care Note  Patient: Gwendolyn Snyder  Procedure(s) Performed: ESOPHAGOGASTRODUODENOSCOPY (EGD) WITH PROPOFOL BIOPSY  Patient Location: Short Stay  Anesthesia Type:General  Level of Consciousness: awake, alert , oriented and patient cooperative  Airway & Oxygen Therapy: Patient Spontanous Breathing  Post-op Assessment: Report given to RN, Post -op Vital signs reviewed and stable and Patient moving all extremities  Post vital signs: Reviewed and stable  Last Vitals:  Vitals Value Taken Time  BP    Temp    Pulse    Resp    SpO2      Last Pain:  Vitals:   10/24/20 1346  TempSrc:   PainSc: 0-No pain      Patients Stated Pain Goal: 5 (10/24/20 1249)  Complications: No notable events documented.

## 2020-10-25 ENCOUNTER — Ambulatory Visit: Payer: PPO | Admitting: Internal Medicine

## 2020-10-26 ENCOUNTER — Telehealth: Payer: Self-pay | Admitting: Internal Medicine

## 2020-10-26 LAB — SURGICAL PATHOLOGY

## 2020-10-26 NOTE — Telephone Encounter (Signed)
PLEASE SEND SCRIPT FOR PANTOPROZOLE TO [facility]

## 2020-10-26 NOTE — Telephone Encounter (Signed)
Spoke to pt, the pharmacy does have her rx and will be sending her the medication today.

## 2020-10-26 NOTE — Telephone Encounter (Signed)
Spoke to pt.  She was informed that RX was sent by Dr. Marletta Lor on 10/24/2020.  She is going to call me back if they don't have it.

## 2020-10-26 NOTE — Telephone Encounter (Signed)
Noted  

## 2020-10-31 ENCOUNTER — Encounter (HOSPITAL_COMMUNITY): Payer: Self-pay | Admitting: Internal Medicine

## 2020-11-01 ENCOUNTER — Telehealth: Payer: Self-pay | Admitting: Internal Medicine

## 2020-11-01 NOTE — Telephone Encounter (Signed)
Please let patient know that the biopsies showed inflammation in the stomach, negative for H. pylori.  Continue on daily PPI  Avoid NSAIDs.  Follow-up with GI as previously scheduled.

## 2020-11-01 NOTE — Telephone Encounter (Signed)
Phoned and spoke to the Gwendolyn Snyder, made aware of her result note, instructions given, follow-up as needed. Gwendolyn Snyder agreed

## 2020-11-09 DIAGNOSIS — N182 Chronic kidney disease, stage 2 (mild): Secondary | ICD-10-CM | POA: Diagnosis not present

## 2020-11-09 DIAGNOSIS — E1122 Type 2 diabetes mellitus with diabetic chronic kidney disease: Secondary | ICD-10-CM | POA: Diagnosis not present

## 2020-11-09 DIAGNOSIS — E7849 Other hyperlipidemia: Secondary | ICD-10-CM | POA: Diagnosis not present

## 2020-11-10 DIAGNOSIS — E538 Deficiency of other specified B group vitamins: Secondary | ICD-10-CM | POA: Diagnosis not present

## 2020-11-30 DIAGNOSIS — N189 Chronic kidney disease, unspecified: Secondary | ICD-10-CM | POA: Diagnosis not present

## 2020-11-30 DIAGNOSIS — E1122 Type 2 diabetes mellitus with diabetic chronic kidney disease: Secondary | ICD-10-CM | POA: Diagnosis not present

## 2020-11-30 DIAGNOSIS — I129 Hypertensive chronic kidney disease with stage 1 through stage 4 chronic kidney disease, or unspecified chronic kidney disease: Secondary | ICD-10-CM | POA: Diagnosis not present

## 2020-11-30 DIAGNOSIS — N170 Acute kidney failure with tubular necrosis: Secondary | ICD-10-CM | POA: Diagnosis not present

## 2020-11-30 DIAGNOSIS — E1129 Type 2 diabetes mellitus with other diabetic kidney complication: Secondary | ICD-10-CM | POA: Diagnosis not present

## 2020-11-30 DIAGNOSIS — E876 Hypokalemia: Secondary | ICD-10-CM | POA: Diagnosis not present

## 2020-11-30 DIAGNOSIS — E6609 Other obesity due to excess calories: Secondary | ICD-10-CM | POA: Diagnosis not present

## 2020-11-30 DIAGNOSIS — R809 Proteinuria, unspecified: Secondary | ICD-10-CM | POA: Diagnosis not present

## 2020-12-07 DIAGNOSIS — N170 Acute kidney failure with tubular necrosis: Secondary | ICD-10-CM | POA: Diagnosis not present

## 2020-12-07 DIAGNOSIS — E876 Hypokalemia: Secondary | ICD-10-CM | POA: Diagnosis not present

## 2020-12-07 DIAGNOSIS — I129 Hypertensive chronic kidney disease with stage 1 through stage 4 chronic kidney disease, or unspecified chronic kidney disease: Secondary | ICD-10-CM | POA: Diagnosis not present

## 2020-12-07 DIAGNOSIS — N189 Chronic kidney disease, unspecified: Secondary | ICD-10-CM | POA: Diagnosis not present

## 2020-12-07 DIAGNOSIS — R809 Proteinuria, unspecified: Secondary | ICD-10-CM | POA: Diagnosis not present

## 2020-12-07 DIAGNOSIS — E1122 Type 2 diabetes mellitus with diabetic chronic kidney disease: Secondary | ICD-10-CM | POA: Diagnosis not present

## 2020-12-07 DIAGNOSIS — E1129 Type 2 diabetes mellitus with other diabetic kidney complication: Secondary | ICD-10-CM | POA: Diagnosis not present

## 2020-12-10 DIAGNOSIS — N182 Chronic kidney disease, stage 2 (mild): Secondary | ICD-10-CM | POA: Diagnosis not present

## 2020-12-10 DIAGNOSIS — E1122 Type 2 diabetes mellitus with diabetic chronic kidney disease: Secondary | ICD-10-CM | POA: Diagnosis not present

## 2020-12-10 DIAGNOSIS — E782 Mixed hyperlipidemia: Secondary | ICD-10-CM | POA: Diagnosis not present

## 2020-12-13 DIAGNOSIS — D510 Vitamin B12 deficiency anemia due to intrinsic factor deficiency: Secondary | ICD-10-CM | POA: Diagnosis not present

## 2020-12-15 DIAGNOSIS — E1129 Type 2 diabetes mellitus with other diabetic kidney complication: Secondary | ICD-10-CM | POA: Diagnosis not present

## 2020-12-15 DIAGNOSIS — N189 Chronic kidney disease, unspecified: Secondary | ICD-10-CM | POA: Diagnosis not present

## 2020-12-15 DIAGNOSIS — E1122 Type 2 diabetes mellitus with diabetic chronic kidney disease: Secondary | ICD-10-CM | POA: Diagnosis not present

## 2020-12-15 DIAGNOSIS — R809 Proteinuria, unspecified: Secondary | ICD-10-CM | POA: Diagnosis not present

## 2020-12-15 DIAGNOSIS — I129 Hypertensive chronic kidney disease with stage 1 through stage 4 chronic kidney disease, or unspecified chronic kidney disease: Secondary | ICD-10-CM | POA: Diagnosis not present

## 2020-12-15 DIAGNOSIS — N170 Acute kidney failure with tubular necrosis: Secondary | ICD-10-CM | POA: Diagnosis not present

## 2021-01-04 DIAGNOSIS — H401111 Primary open-angle glaucoma, right eye, mild stage: Secondary | ICD-10-CM | POA: Diagnosis not present

## 2021-01-04 DIAGNOSIS — E119 Type 2 diabetes mellitus without complications: Secondary | ICD-10-CM | POA: Diagnosis not present

## 2021-01-12 DIAGNOSIS — D519 Vitamin B12 deficiency anemia, unspecified: Secondary | ICD-10-CM | POA: Diagnosis not present

## 2021-01-19 DIAGNOSIS — E1129 Type 2 diabetes mellitus with other diabetic kidney complication: Secondary | ICD-10-CM | POA: Diagnosis not present

## 2021-01-19 DIAGNOSIS — R809 Proteinuria, unspecified: Secondary | ICD-10-CM | POA: Diagnosis not present

## 2021-01-19 DIAGNOSIS — E1122 Type 2 diabetes mellitus with diabetic chronic kidney disease: Secondary | ICD-10-CM | POA: Diagnosis not present

## 2021-01-19 DIAGNOSIS — N170 Acute kidney failure with tubular necrosis: Secondary | ICD-10-CM | POA: Diagnosis not present

## 2021-01-19 DIAGNOSIS — N189 Chronic kidney disease, unspecified: Secondary | ICD-10-CM | POA: Diagnosis not present

## 2021-01-19 DIAGNOSIS — I129 Hypertensive chronic kidney disease with stage 1 through stage 4 chronic kidney disease, or unspecified chronic kidney disease: Secondary | ICD-10-CM | POA: Diagnosis not present

## 2021-01-26 DIAGNOSIS — N189 Chronic kidney disease, unspecified: Secondary | ICD-10-CM | POA: Diagnosis not present

## 2021-01-26 DIAGNOSIS — E1129 Type 2 diabetes mellitus with other diabetic kidney complication: Secondary | ICD-10-CM | POA: Diagnosis not present

## 2021-01-26 DIAGNOSIS — E876 Hypokalemia: Secondary | ICD-10-CM | POA: Diagnosis not present

## 2021-01-26 DIAGNOSIS — I129 Hypertensive chronic kidney disease with stage 1 through stage 4 chronic kidney disease, or unspecified chronic kidney disease: Secondary | ICD-10-CM | POA: Diagnosis not present

## 2021-01-26 DIAGNOSIS — E6609 Other obesity due to excess calories: Secondary | ICD-10-CM | POA: Diagnosis not present

## 2021-01-26 DIAGNOSIS — R809 Proteinuria, unspecified: Secondary | ICD-10-CM | POA: Diagnosis not present

## 2021-01-26 DIAGNOSIS — E1122 Type 2 diabetes mellitus with diabetic chronic kidney disease: Secondary | ICD-10-CM | POA: Diagnosis not present

## 2021-01-26 DIAGNOSIS — N200 Calculus of kidney: Secondary | ICD-10-CM | POA: Diagnosis not present

## 2021-01-29 ENCOUNTER — Other Ambulatory Visit: Payer: Self-pay | Admitting: Internal Medicine

## 2021-02-02 DIAGNOSIS — E872 Acidosis: Secondary | ICD-10-CM | POA: Diagnosis not present

## 2021-02-02 DIAGNOSIS — Z79899 Other long term (current) drug therapy: Secondary | ICD-10-CM | POA: Diagnosis not present

## 2021-02-02 DIAGNOSIS — E1122 Type 2 diabetes mellitus with diabetic chronic kidney disease: Secondary | ICD-10-CM | POA: Diagnosis not present

## 2021-02-02 DIAGNOSIS — E876 Hypokalemia: Secondary | ICD-10-CM | POA: Diagnosis not present

## 2021-02-02 DIAGNOSIS — I129 Hypertensive chronic kidney disease with stage 1 through stage 4 chronic kidney disease, or unspecified chronic kidney disease: Secondary | ICD-10-CM | POA: Diagnosis not present

## 2021-02-02 DIAGNOSIS — E1129 Type 2 diabetes mellitus with other diabetic kidney complication: Secondary | ICD-10-CM | POA: Diagnosis not present

## 2021-02-02 DIAGNOSIS — R809 Proteinuria, unspecified: Secondary | ICD-10-CM | POA: Diagnosis not present

## 2021-02-02 DIAGNOSIS — N189 Chronic kidney disease, unspecified: Secondary | ICD-10-CM | POA: Diagnosis not present

## 2021-02-09 DIAGNOSIS — E782 Mixed hyperlipidemia: Secondary | ICD-10-CM | POA: Diagnosis not present

## 2021-02-09 DIAGNOSIS — E1122 Type 2 diabetes mellitus with diabetic chronic kidney disease: Secondary | ICD-10-CM | POA: Diagnosis not present

## 2021-02-09 DIAGNOSIS — N182 Chronic kidney disease, stage 2 (mild): Secondary | ICD-10-CM | POA: Diagnosis not present

## 2021-02-12 DIAGNOSIS — Z23 Encounter for immunization: Secondary | ICD-10-CM | POA: Diagnosis not present

## 2021-02-12 DIAGNOSIS — E538 Deficiency of other specified B group vitamins: Secondary | ICD-10-CM | POA: Diagnosis not present

## 2021-02-13 ENCOUNTER — Other Ambulatory Visit (HOSPITAL_COMMUNITY): Payer: Self-pay | Admitting: Family Medicine

## 2021-02-13 DIAGNOSIS — Z1231 Encounter for screening mammogram for malignant neoplasm of breast: Secondary | ICD-10-CM

## 2021-03-01 ENCOUNTER — Ambulatory Visit: Payer: PPO | Admitting: Gastroenterology

## 2021-03-01 ENCOUNTER — Encounter: Payer: Self-pay | Admitting: Gastroenterology

## 2021-03-01 ENCOUNTER — Other Ambulatory Visit: Payer: Self-pay

## 2021-03-01 DIAGNOSIS — K219 Gastro-esophageal reflux disease without esophagitis: Secondary | ICD-10-CM | POA: Diagnosis not present

## 2021-03-01 NOTE — Progress Notes (Signed)
Referring Provider: Assunta Found, MD Primary Care Physician:  Assunta Found, MD Primary GI: Dr. Marletta Lor   Chief Complaint  Patient presents with   Emesis    PP f/u. No vomiting now. Swallowing ok    HPI:   Gwendolyn Snyder is a 70 y.o. female presenting today with a history of N/V and known large hiatal hernia. EGD was completed June 2022 with large hiatal hernia, gastritis s/p biopsy. Normal duodenum. Negative H.pylori. Started on PPI. Colonoscopy on file from 2020, which was normal.  Returns with no complaints today. Continues on pantoprazole but has been taking after eating. No abdominal pain, N/V. No GERD exacerbation. No constipation or diarrhea. Denies overt GI bleeding. Stopped [employer]. Changed diet habits.     Past Medical History:  Diagnosis Date   Anemia    Anxiety    Arthritis    Bilateral carpal tunnel syndrome    right hand- surgery , left hand has not and left hand goes to sleep    Cataracts, bilateral    Complication of anesthesia    once after surgery felt like she could not breathe- 11/09/15- surgery greater than 11 years ago   COPD (chronic obstructive pulmonary disease) (HCC)    Diabetes mellitus without complication (HCC)    diet controlled , type II    Family history of adverse reaction to anesthesia    sister- confused   Glaucoma    History of hiatal hernia    History of kidney stones    Hypertension    Renal disorder    Shortness of breath dyspnea    with exertion    Past Surgical History:  Procedure Laterality Date   ABDOMINAL HYSTERECTOMY     BIOPSY  10/24/2020   Procedure: BIOPSY;  Surgeon: [name], DO;  Location: [facility];  Service: Endoscopy;;   CARPAL TUNNEL RELEASE Right    COLONOSCOPY     COLONOSCOPY N/A 01/05/2019   Procedure: COLONOSCOPY;  Surgeon: Franky Macho, MD;  Location: [facility];  Service: Gastroenterology;  Laterality: N/A;   CYSTOSCOPY/RETROGRADE/URETEROSCOPY/STONE EXTRACTION WITH BASKET  Left 09/17/2016   Procedure: CYSTOSCOPY/RETROGRADE/URETEROSCOPY/STONE EXTRACTION WITH BASKET/ STENT REMOVAL;  Surgeon: Bjorn Pippin, MD;  Location: [facility];  Service: Urology;  Laterality: Left;   ESOPHAGOGASTRODUODENOSCOPY (EGD) WITH PROPOFOL N/A 10/24/2020   Procedure: ESOPHAGOGASTRODUODENOSCOPY (EGD) WITH PROPOFOL;  Surgeon: [name], DO;  Location: [facility];  Service: Endoscopy;  Laterality: N/A;  2:15pm   EXTRACORPOREAL SHOCK WAVE LITHOTRIPSY Left 11/16/2018   Procedure: EXTRACORPOREAL SHOCK WAVE LITHOTRIPSY (ESWL);  Surgeon: Crista Elliot, MD;  Location: [facility];  Service: Urology;  Laterality: Left;   EXTRACORPOREAL SHOCK WAVE LITHOTRIPSY Left 01/21/2019   Procedure: EXTRACORPOREAL SHOCK WAVE LITHOTRIPSY (ESWL);  Surgeon: Bjorn Pippin, MD;  Location: [facility];  Service: Urology;  Laterality: Left;   IR URETERAL STENT LEFT NEW ACCESS W/O SEP NEPHROSTOMY CATH  08/20/2016   JOINT REPLACEMENT     NEPHROLITHOTOMY Left 08/20/2016   Procedure: LEFT NEPHROLITHOTOMY PERCUTANEOUS FIRST STAGE;  Surgeon: Bjorn Pippin, MD;  Location: [facility];  Service: Urology;  Laterality: Left;   NEPHROLITHOTOMY Left 08/22/2016   Procedure: LEFT NEPHROLITHOTOMY PERCUTANEOUS SECOND LOOK;  Surgeon: Bjorn Pippin, MD;  Location: [facility];  Service: Urology;  Laterality: Left;   Removal kidney stone     TOTAL HIP ARTHROPLASTY Bilateral    TOTAL HIP REVISION Left 11/22/2015   Procedure: Revision Left Total Hip Arthroplasty, [employer];  Surgeon: Eldred Manges, MD;  Location: [facility] OR;  Service: Orthopedics;  Laterality: Left;   TOTAL KNEE ARTHROPLASTY Right     Current Outpatient Medications  Medication Sig Dispense Refill   acetaminophen (TYLENOL) 650 MG CR tablet Take 1,300 mg by mouth 2 (two) times daily.     allopurinol (ZYLOPRIM) 300 MG tablet Take 1 tablet (300 mg total) by mouth every evening. 90 tablet 3   amLODipine (NORVASC) 10 MG tablet Take 10 mg by mouth daily.     carvedilol (COREG) 3.125 MG tablet  Take 6.25 mg by mouth 2 (two) times daily.     cholecalciferol (VITAMIN D) 1000 units tablet Take 1,000 Units by mouth daily.     cyanocobalamin (,VITAMIN B-12,) 1000 MCG/ML injection Inject 1,000 mcg into the skin every 30 (thirty) days.     ezetimibe (ZETIA) 10 MG tablet Take 10 mg by mouth daily.     FREESTYLE LITE test strip Use to test blood sugar once daily     Inositol Niacinate 500 MG CAPS Take 500 mg by mouth 2 (two) times daily.      latanoprost (XALATAN) 0.005 % ophthalmic solution Place 1 drop into both eyes at bedtime.     lisinopril (PRINIVIL,ZESTRIL) 20 MG tablet Take 20 mg by mouth 2 (two) times daily.      omega-3 acid ethyl esters (LOVAZA) 1 g capsule Take 1 g by mouth 2 (two) times daily.      pantoprazole (PROTONIX) 40 MG tablet TAKE ONE TABLET BY MOUTH ONCE DAILY (Patient taking differently: Take 40 mg by mouth 2 (two) times daily.) 30 tablet 5   Potassium Citrate 15 MEQ (1620 MG) TBCR Take 2 tabs qam and 1 tab qpm po (Patient taking differently: Take 1 tablet by mouth in the morning and at bedtime.) 270 tablet 3   rosuvastatin (CRESTOR) 5 MG tablet Take 5 mg by mouth at bedtime.     sitaGLIPtin (JANUVIA) 100 MG tablet Take 1 tablet (100 mg total) by mouth daily.     Coenzyme Q10 (CO Q 10) 100 MG CAPS Take 100 mg by mouth daily.     No current facility-administered medications for this visit.    Allergies as of 03/01/2021 - Review Complete 03/01/2021  Allergen Reaction Noted   Aspirin Other (See Comments) 09/28/2015   Codeine Other (See Comments) 09/28/2015   Flomax [tamsulosin hcl] Nausea And Vomiting 01/13/2019   Pravastatin  09/10/2019    Family History  Problem Relation Age of Onset   Colon cancer Neg Hx    Colon polyps Neg Hx     Social History   Socioeconomic History   Marital status: Single    Spouse name: Not on file   Number of children: Not on file   Years of education: Not on file   Highest education level: Not on file  Occupational History    Not on file  Tobacco Use   Smoking status: Former    Packs/day: 1.00    Years: 30.00    Pack years: 30.00    Types: Cigarettes    Quit date: 05/13/2004    Years since quitting: 16.8   Smokeless tobacco: Never  Vaping Use   Vaping Use: Never used  Substance and Sexual Activity   Alcohol use: No    Alcohol/week: 0.0 standard drinks   Drug use: No   Sexual activity: Not on file  Other Topics Concern   Not on file  Social History Narrative   Not on  file   Social Determinants of Health   Financial Resource Strain: Not on file  Food Insecurity: Not on file  Transportation Needs: Not on file  Physical Activity: Not on file  Stress: Not on file  Social Connections: Not on file    Review of Systems: Gen: Denies fever, chills, anorexia. Denies fatigue, weakness, weight loss.  CV: Denies chest pain, palpitations, syncope, peripheral edema, and claudication. Resp: Denies dyspnea at rest, cough, wheezing, coughing up blood, and pleurisy. GI: see HPI Derm: Denies rash, itching, dry skin Psych: Denies depression, anxiety, memory loss, confusion. No homicidal or suicidal ideation.  Heme: Denies bruising, bleeding, and enlarged lymph nodes.  Physical Exam: BP (!) 175/76   Pulse 64   Temp (!) 97.3 F (36.3 C) (Temporal)   Ht 5\' 3"  (1.6 m)   Wt 169 lb 3.2 oz (76.7 kg)   BMI 29.97 kg/m  General:   Alert and oriented. No distress noted. Pleasant and cooperative.  Head:  Normocephalic and atraumatic. Eyes:  Conjuctiva clear without scleral icterus. Mouth:  mask in place Abdomen:  +BS, soft, non-tender and non-distended. No rebound or guarding. No HSM or masses noted. Msk:  Symmetrical without gross deformities. Normal posture. Extremities:  Without edema. Neurologic:  Alert and  oriented x4 Psych:  Alert and cooperative. Normal mood and affect.  ASSESSMENT/PLAN: Gwendolyn Snyder is a 70 y.o. female presenting today with history of N/V, large hiatal hernia, and undergoing EGD in  interim from last visit.  EGD with known large hiatal hernia but no ulcerative process. Negative H.pylori. She has noted improvement on PPI daily and has had no further symptoms. Prior presentation secondary to known large hiatal hernia and uncontrolled GERD. We discussed that if she were to have recurrent symptoms, would refer for surgical consideration.   Colonoscopy on file from 2020 and normal.  Will see her back in 1 year or sooner if needed. Continue pantoprazole daily. Discussed taking 30 minutes prior to eating for best efficacy.  2021, PhD, ANP-BC [facility]

## 2021-03-01 NOTE — Patient Instructions (Signed)
We will see you back in 1 year or sooner if needed!  Make sure to take pantoprazole (Protonix) in the morning, 30 minutes before breakfast, as it is best absorbed this way!  It was a pleasure to see you today. I want to create trusting relationships with patients to provide genuine, compassionate, and quality care. I value your feedback. If you receive a survey regarding your visit,  I greatly appreciate you taking time to fill this out.   Gelene Mink, PhD, ANP-BC [facility]

## 2021-03-13 DIAGNOSIS — E538 Deficiency of other specified B group vitamins: Secondary | ICD-10-CM | POA: Diagnosis not present

## 2021-03-19 ENCOUNTER — Other Ambulatory Visit: Payer: Self-pay

## 2021-03-19 ENCOUNTER — Ambulatory Visit (HOSPITAL_COMMUNITY)
Admission: RE | Admit: 2021-03-19 | Discharge: 2021-03-19 | Disposition: A | Discharge status: Home / Self Care | Payer: PPO | Source: Ambulatory Visit | Attending: Family Medicine | Admitting: Family Medicine

## 2021-03-19 DIAGNOSIS — Z1231 Encounter for screening mammogram for malignant neoplasm of breast: Secondary | ICD-10-CM | POA: Diagnosis not present

## 2021-03-22 DIAGNOSIS — R809 Proteinuria, unspecified: Secondary | ICD-10-CM | POA: Diagnosis not present

## 2021-03-22 DIAGNOSIS — E1122 Type 2 diabetes mellitus with diabetic chronic kidney disease: Secondary | ICD-10-CM | POA: Diagnosis not present

## 2021-03-22 DIAGNOSIS — N1832 Chronic kidney disease, stage 3b: Secondary | ICD-10-CM | POA: Diagnosis not present

## 2021-03-22 DIAGNOSIS — N189 Chronic kidney disease, unspecified: Secondary | ICD-10-CM | POA: Diagnosis not present

## 2021-03-22 DIAGNOSIS — Z79899 Other long term (current) drug therapy: Secondary | ICD-10-CM | POA: Diagnosis not present

## 2021-03-22 DIAGNOSIS — E876 Hypokalemia: Secondary | ICD-10-CM | POA: Diagnosis not present

## 2021-03-30 DIAGNOSIS — R809 Proteinuria, unspecified: Secondary | ICD-10-CM | POA: Diagnosis not present

## 2021-03-30 DIAGNOSIS — N189 Chronic kidney disease, unspecified: Secondary | ICD-10-CM | POA: Diagnosis not present

## 2021-03-30 DIAGNOSIS — E1122 Type 2 diabetes mellitus with diabetic chronic kidney disease: Secondary | ICD-10-CM | POA: Diagnosis not present

## 2021-03-30 DIAGNOSIS — E8721 Acute metabolic acidosis: Secondary | ICD-10-CM | POA: Diagnosis not present

## 2021-03-30 DIAGNOSIS — I129 Hypertensive chronic kidney disease with stage 1 through stage 4 chronic kidney disease, or unspecified chronic kidney disease: Secondary | ICD-10-CM | POA: Diagnosis not present

## 2021-03-30 DIAGNOSIS — E6609 Other obesity due to excess calories: Secondary | ICD-10-CM | POA: Diagnosis not present

## 2021-03-30 DIAGNOSIS — E1129 Type 2 diabetes mellitus with other diabetic kidney complication: Secondary | ICD-10-CM | POA: Diagnosis not present

## 2021-04-12 DIAGNOSIS — E538 Deficiency of other specified B group vitamins: Secondary | ICD-10-CM | POA: Diagnosis not present

## 2021-05-11 DIAGNOSIS — E782 Mixed hyperlipidemia: Secondary | ICD-10-CM | POA: Diagnosis not present

## 2021-05-11 DIAGNOSIS — N182 Chronic kidney disease, stage 2 (mild): Secondary | ICD-10-CM | POA: Diagnosis not present

## 2021-05-11 DIAGNOSIS — E1122 Type 2 diabetes mellitus with diabetic chronic kidney disease: Secondary | ICD-10-CM | POA: Diagnosis not present

## 2021-05-15 DIAGNOSIS — E538 Deficiency of other specified B group vitamins: Secondary | ICD-10-CM | POA: Diagnosis not present

## 2021-05-18 DIAGNOSIS — E7849 Other hyperlipidemia: Secondary | ICD-10-CM | POA: Diagnosis not present

## 2021-05-18 DIAGNOSIS — G729 Myopathy, unspecified: Secondary | ICD-10-CM | POA: Diagnosis not present

## 2021-05-18 DIAGNOSIS — I10 Essential (primary) hypertension: Secondary | ICD-10-CM | POA: Diagnosis not present

## 2021-05-18 DIAGNOSIS — E1122 Type 2 diabetes mellitus with diabetic chronic kidney disease: Secondary | ICD-10-CM | POA: Diagnosis not present

## 2021-05-18 DIAGNOSIS — E538 Deficiency of other specified B group vitamins: Secondary | ICD-10-CM | POA: Diagnosis not present

## 2021-05-18 DIAGNOSIS — H409 Unspecified glaucoma: Secondary | ICD-10-CM | POA: Diagnosis not present

## 2021-05-18 DIAGNOSIS — E782 Mixed hyperlipidemia: Secondary | ICD-10-CM | POA: Diagnosis not present

## 2021-05-18 DIAGNOSIS — E6609 Other obesity due to excess calories: Secondary | ICD-10-CM | POA: Diagnosis not present

## 2021-05-18 DIAGNOSIS — E1165 Type 2 diabetes mellitus with hyperglycemia: Secondary | ICD-10-CM | POA: Diagnosis not present

## 2021-05-18 DIAGNOSIS — Z6830 Body mass index (BMI) 30.0-30.9, adult: Secondary | ICD-10-CM | POA: Diagnosis not present

## 2021-05-18 DIAGNOSIS — E049 Nontoxic goiter, unspecified: Secondary | ICD-10-CM | POA: Diagnosis not present

## 2021-06-05 ENCOUNTER — Other Ambulatory Visit: Payer: Self-pay

## 2021-06-05 ENCOUNTER — Ambulatory Visit (HOSPITAL_COMMUNITY)
Admission: RE | Admit: 2021-06-05 | Discharge: 2021-06-05 | Disposition: A | Discharge status: Home / Self Care | Payer: PPO | Source: Ambulatory Visit | Attending: Urology | Admitting: Urology

## 2021-06-05 DIAGNOSIS — N200 Calculus of kidney: Secondary | ICD-10-CM | POA: Insufficient documentation

## 2021-06-07 ENCOUNTER — Encounter: Payer: Self-pay | Admitting: Urology

## 2021-06-07 ENCOUNTER — Other Ambulatory Visit: Payer: Self-pay

## 2021-06-07 ENCOUNTER — Inpatient Hospital Stay: Admission: RE | Admit: 2021-06-07 | Payer: Self-pay | Source: Ambulatory Visit

## 2021-06-07 ENCOUNTER — Ambulatory Visit: Payer: PPO | Admitting: Urology

## 2021-06-07 VITALS — BP 124/75 | HR 68 | Wt 168.0 lb

## 2021-06-07 DIAGNOSIS — R8271 Bacteriuria: Secondary | ICD-10-CM | POA: Diagnosis not present

## 2021-06-07 DIAGNOSIS — M1030 Gout due to renal impairment, unspecified site: Secondary | ICD-10-CM

## 2021-06-07 DIAGNOSIS — N289 Disorder of kidney and ureter, unspecified: Secondary | ICD-10-CM | POA: Diagnosis not present

## 2021-06-07 DIAGNOSIS — N200 Calculus of kidney: Secondary | ICD-10-CM | POA: Diagnosis not present

## 2021-06-07 LAB — URINALYSIS, ROUTINE W REFLEX MICROSCOPIC
Bilirubin, UA: NEGATIVE
Glucose, UA: NEGATIVE
Ketones, UA: NEGATIVE
Nitrite, UA: NEGATIVE
Protein,UA: NEGATIVE
RBC, UA: NEGATIVE
Specific Gravity, UA: 1.02 (ref 1.005–1.030)
Urobilinogen, Ur: 0.2 mg/dL (ref 0.2–1.0)
pH, UA: 5 (ref 5.0–7.5)

## 2021-06-07 LAB — MICROSCOPIC EXAMINATION: RBC, Urine: NONE SEEN /hpf (ref 0–2)

## 2021-06-07 MED ORDER — ALLOPURINOL 300 MG PO TABS: 300.0000 mg | ORAL_TABLET | Freq: Every evening | ORAL | 3 refills | Status: DC

## 2021-06-07 NOTE — Progress Notes (Signed)
Urological Symptom Review  Patient is experiencing the following symptoms: none   Review of Systems  Gastrointestinal (upper)  : Negative for upper GI symptoms  Gastrointestinal (lower) : Negative for lower GI symptoms  Constitutional : Fatigue  Skin: Negative for skin symptoms  Eyes: Negative for eye symptoms  Ear/Nose/Throat : Negative for Ear/Nose/Throat symptoms  Hematologic/Lymphatic: Easy bruising  Cardiovascular : Negative for cardiovascular symptoms  Respiratory : Shortness of breath  Endocrine: Negative for endocrine symptoms  Musculoskeletal: Joint pain  Neurological: Negative for neurological symptoms  Psychologic: Negative for psychiatric symptoms

## 2021-06-07 NOTE — Progress Notes (Signed)
Subjective:  Maricel returns today in f/u from a second ESWL on 01/21/19 for her recurrent left renal stones.  The initial ESWL was in 7/20.  I reviewed the films and report from her recent CT with her.  There is significant residual stone burden.   She has had no flank pain or hematuria.   A KUB prior to this visit shows stable bilateral renal stones, left > right, and no ureteral stone.  UA is clear.    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 She remains on allopurinol and potassium citrate. Her stones were 80% UA and 20% CaOx.   She had chemistries in 7/20 and her Calcium was 10.7 and the Cr was 1.25.   She is due labs with Dr. Hilma Favors in the near future.    06/07/21: Arrilla returns today in f/u for her history of stones.  KUB prior to this visit shows stable bilateral renal stones, left > right.  Last labs in Epic showed a Cr of 1.58.  She remains on allopurinol and potassium citrate.  She had stomach trouble over the summer.  She has cut out mountain dew at the recommendation of nephrology and has had some improvement in her renal function.   Her Cr was 1.20 on 02/02/21 and 1.55 on 03/22/21.  Her potassium is 4.4 but she had been off of the potassium citrate for a while but is back on it now. .  She has seen Dr. Hilma Favors and there are some concerns about possible hyperthyroidism.  UA today has 11-30 WBC and many bacteria.  She has no hematuria, dysuria or flank pain.       ROS:  ROS:  A complete review of systems was performed.  All systems are negative except for pertinent findings as noted.   ROS  Allergies  Allergen Reactions   Aspirin Other (See Comments)    Makes ears ring and heart beat fast   Codeine Other (See Comments)    Hard to wake up   Flomax [Tamsulosin Hcl] Nausea And Vomiting   Pravastatin     Myalgias     Outpatient Encounter Medications as of 06/07/2021  Medication Sig   acetaminophen (TYLENOL) 650 MG CR tablet Take 1,300 mg by mouth 2  (two) times daily.   amLODipine (NORVASC) 10 MG tablet Take 10 mg by mouth daily.   carvedilol (COREG) 3.125 MG tablet Take 6.25 mg by mouth 2 (two) times daily.   cholecalciferol (VITAMIN D) 1000 units tablet Take 1,000 Units by mouth daily.   Coenzyme Q10 (CO Q 10) 100 MG CAPS Take 100 mg by mouth daily.   cyanocobalamin (,VITAMIN B-12,) 1000 MCG/ML injection Inject 1,000 mcg into the skin every 30 (thirty) days.   ezetimibe (ZETIA) 10 MG tablet Take 10 mg by mouth daily.   FREESTYLE LITE test strip Use to test blood sugar once daily   Inositol Niacinate 500 MG CAPS Take 500 mg by mouth 2 (two) times daily.    latanoprost (XALATAN) 0.005 % ophthalmic solution Place 1 drop into both eyes at bedtime.   lisinopril (PRINIVIL,ZESTRIL) 20 MG tablet Take 20 mg by mouth 2 (two) times daily.    omega-3 acid ethyl esters (LOVAZA) 1 g capsule Take 1 g by mouth 2 (two) times daily.    pantoprazole (PROTONIX) 40 MG tablet TAKE ONE TABLET BY MOUTH ONCE DAILY (Patient taking differently: Take 40 mg by mouth 2 (two) times daily.)  Potassium Citrate 15 MEQ (1620 MG) TBCR Take 2 tabs qam and 1 tab qpm po (Patient taking differently: Take 1 tablet by mouth in the morning and at bedtime.)   rosuvastatin (CRESTOR) 5 MG tablet Take 5 mg by mouth at bedtime.   sitaGLIPtin (JANUVIA) 100 MG tablet Take 1 tablet (100 mg total) by mouth daily.   [DISCONTINUED] allopurinol (ZYLOPRIM) 300 MG tablet Take 1 tablet (300 mg total) by mouth every evening.   allopurinol (ZYLOPRIM) 300 MG tablet Take 1 tablet (300 mg total) by mouth every evening.   [DISCONTINUED] famotidine (PEPCID) 20 MG tablet Take 1 tablet (20 mg total) by mouth 2 (two) times daily.   [DISCONTINUED] omeprazole (PRILOSEC) 20 MG capsule Take 1 capsule (20 mg total) by mouth daily.   No facility-administered encounter medications on file as of 06/07/2021.    Past Medical History:  Diagnosis Date   Anemia    Anxiety    Arthritis    Bilateral carpal  tunnel syndrome    right hand- surgery , left hand has not and left hand goes to sleep    Cataracts, bilateral    Complication of anesthesia    once after surgery felt like she could not breathe- 11/09/15- surgery greater than 11 years ago   COPD (chronic obstructive pulmonary disease) (HCC)    Diabetes mellitus without complication (HCC)    diet controlled , type II    Family history of adverse reaction to anesthesia    sister- confused   Glaucoma    History of hiatal hernia    History of kidney stones    Hypertension    Renal disorder    Shortness of breath dyspnea    with exertion    Past Surgical History:  Procedure Laterality Date   ABDOMINAL HYSTERECTOMY     BIOPSY  10/24/2020   Procedure: BIOPSY;  Surgeon: [name], DO;  [facility];  Service: Endoscopy;;   CARPAL TUNNEL RELEASE Right    COLONOSCOPY     COLONOSCOPY N/A 01/05/2019   normal   CYSTOSCOPY/RETROGRADE/URETEROSCOPY/STONE EXTRACTION WITH BASKET Left 09/17/2016   Procedure: CYSTOSCOPY/RETROGRADE/URETEROSCOPY/STONE EXTRACTION WITH BASKET/ STENT REMOVAL;  Surgeon: Bjorn Pippin, MD;  Location: [facility];  Service: Urology;  Laterality: Left;   ESOPHAGOGASTRODUODENOSCOPY (EGD) WITH PROPOFOL N/A 10/24/2020   large hiatal hernia, gastritis s/p biopsy. Normal duodenum. Negative H.pylori.   EXTRACORPOREAL SHOCK WAVE LITHOTRIPSY Left 11/16/2018   Procedure: EXTRACORPOREAL SHOCK WAVE LITHOTRIPSY (ESWL);  Surgeon: Crista Elliot, MD;  Location: [facility];  Service: Urology;  Laterality: Left;   EXTRACORPOREAL SHOCK WAVE LITHOTRIPSY Left 01/21/2019   Procedure: EXTRACORPOREAL SHOCK WAVE LITHOTRIPSY (ESWL);  Surgeon: Bjorn Pippin, MD;  Location: [facility];  Service: Urology;  Laterality: Left;   IR URETERAL STENT LEFT NEW ACCESS W/O SEP NEPHROSTOMY CATH  08/20/2016   JOINT REPLACEMENT     NEPHROLITHOTOMY Left 08/20/2016   Procedure: LEFT NEPHROLITHOTOMY PERCUTANEOUS FIRST STAGE;  Surgeon: Bjorn Pippin, MD;   Location: [facility];  Service: Urology;  Laterality: Left;   NEPHROLITHOTOMY Left 08/22/2016   Procedure: LEFT NEPHROLITHOTOMY PERCUTANEOUS SECOND LOOK;  Surgeon: Bjorn Pippin, MD;  Location: [facility];  Service: Urology;  Laterality: Left;   Removal kidney stone     TOTAL HIP ARTHROPLASTY Bilateral    TOTAL HIP REVISION Left 11/22/2015   Procedure: Revision Left Total Hip Arthroplasty, Poly and Newman Pies Exchange;  Surgeon: Eldred Manges, MD;  Location: [facility] OR;  Service: Orthopedics;  Laterality: Left;   TOTAL KNEE  ARTHROPLASTY Right     Social History   Socioeconomic History   Marital status: Single    Spouse name: Not on file   Number of children: Not on file   Years of education: Not on file   Highest education level: Not on file  Occupational History   Not on file  Tobacco Use   Smoking status: Former    Packs/day: 1.00    Years: 30.00    Pack years: 30.00    Types: Cigarettes    Quit date: 05/13/2004    Years since quitting: 17.0   Smokeless tobacco: Never  Vaping Use   Vaping Use: Never used  Substance and Sexual Activity   Alcohol use: No    Alcohol/week: 0.0 standard drinks   Drug use: No   Sexual activity: Not on file  Other Topics Concern   Not on file  Social History Narrative   Not on file   Social Determinants of Health   Financial Resource Strain: Not on file  Food Insecurity: Not on file  Transportation Needs: Not on file  Physical Activity: Not on file  Stress: Not on file  Social Connections: Not on file  Intimate Partner Violence: Not on file    Family History  Problem Relation Age of Onset   Colon cancer Neg Hx    Colon polyps Neg Hx        Objective: Vitals:   06/07/21 1035  BP: 124/75  Pulse: 68      Physical Exam  Lab Results:  Results for orders placed or performed in visit on 06/07/21 (from the past 24 hour(s))  Urinalysis, Routine w reflex microscopic     Status: Abnormal   Collection Time: 06/07/21 10:49 AM  Result Value Ref Range    Specific Gravity, UA 1.020 1.005 - 1.030   pH, UA 5.0 5.0 - 7.5   Color, UA Yellow Yellow   Appearance Ur Clear Clear   Leukocytes,UA 2+ (A) Negative   Protein,UA Negative Negative/Trace   Glucose, UA Negative Negative   Ketones, UA Negative Negative   RBC, UA Negative Negative   Bilirubin, UA Negative Negative   Urobilinogen, Ur 0.2 0.2 - 1.0 mg/dL   Nitrite, UA Negative Negative   Microscopic Examination See below:    Narrative   Performed at:  [facility] [address], [place], [place]  [id number] Lab Director: Mina Marble MT, Phone:  [phone]  Microscopic Examination     Status: Abnormal   Collection Time: 06/07/21 10:49 AM   Urine  Result Value Ref Range   WBC, UA 11-30 (A) 0 - 5 /hpf   RBC None seen 0 - 2 /hpf   Epithelial Cells (non renal) 0-10 0 - 10 /hpf   Renal Epithel, UA 0-10 (A) None seen /hpf   Mucus, UA Present Not Estab.   Bacteria, UA Many (A) None seen/Few   Narrative   Performed at:  [facility] [address] [place], [place]  [id number] Lab Director: [place], Phone:  [phone]     BMET No results for input(s): NA, K, CL, CO2, GLUCOSE, BUN, CREATININE, CALCIUM in the last 72 hours. PSA No results found for: PSA No results found for: TESTOSTERONE  UA has 11-30 WBC with many bacteria.  Studies/Results: DG Abd 1 View  Result Date: 06/05/2021 CLINICAL DATA:  Kidney stones EXAM: ABDOMEN - 1 VIEW COMPARISON:  06/05/2020 FINDINGS: Multiple bilateral kidney stones, the largest on the left measures  14 mm and the largest on the right measures 4 mm. Nonobstructed gas pattern. IMPRESSION: Multiple left greater than right kidney stones Electronically Signed   By: Donavan Foil M.D.   On: 06/05/2021 21:26      Assessment & Plan: Bilateral renal stones with a stable stone burden left > right.  KUB in a year.   Allopurinol refilled.  Potassium citrate refill not needed yet.   History of uric acid stones with  gouty nephropathy.     Pyuria and bacteriuria.  Culture today.     Meds ordered this encounter  Medications   allopurinol (ZYLOPRIM) 300 MG tablet    Sig: Take 1 tablet (300 mg total) by mouth every evening.    Dispense:  90 tablet    Refill:  3     Orders Placed This Encounter  Procedures   Urine Culture   Microscopic Examination   DG Abd 1 View    Standing Status:   Future    Standing Expiration Date:   06/07/2022    Order Specific Question:   Reason for Exam (SYMPTOM  OR DIAGNOSIS REQUIRED)    Answer:   renal stones    Order Specific Question:   Preferred imaging location?    Answer:   [facility]    Order Specific Question:   Radiology Contrast Protocol - do NOT remove file path    Answer:   [email or web address]   Urinalysis, Routine w reflex microscopic      Return in about 1 year (around 06/07/2022) for with KUB.    CC: Sharilyn Sites, MD      Irine Seal 06/07/2021 Patient ID: Forde Dandy, female   DOB: 1950-12-05, 71 y.o.   MRN: [id number]

## 2021-06-09 LAB — URINE CULTURE

## 2021-06-13 DIAGNOSIS — E538 Deficiency of other specified B group vitamins: Secondary | ICD-10-CM | POA: Diagnosis not present

## 2021-06-15 DIAGNOSIS — H401111 Primary open-angle glaucoma, right eye, mild stage: Secondary | ICD-10-CM | POA: Diagnosis not present

## 2021-07-09 DIAGNOSIS — E1122 Type 2 diabetes mellitus with diabetic chronic kidney disease: Secondary | ICD-10-CM | POA: Diagnosis not present

## 2021-07-09 DIAGNOSIS — R809 Proteinuria, unspecified: Secondary | ICD-10-CM | POA: Diagnosis not present

## 2021-07-09 DIAGNOSIS — E8721 Acute metabolic acidosis: Secondary | ICD-10-CM | POA: Diagnosis not present

## 2021-07-09 DIAGNOSIS — E538 Deficiency of other specified B group vitamins: Secondary | ICD-10-CM | POA: Diagnosis not present

## 2021-07-09 DIAGNOSIS — E6609 Other obesity due to excess calories: Secondary | ICD-10-CM | POA: Diagnosis not present

## 2021-07-09 DIAGNOSIS — E1129 Type 2 diabetes mellitus with other diabetic kidney complication: Secondary | ICD-10-CM | POA: Diagnosis not present

## 2021-07-09 DIAGNOSIS — Z6830 Body mass index (BMI) 30.0-30.9, adult: Secondary | ICD-10-CM | POA: Diagnosis not present

## 2021-07-09 DIAGNOSIS — R946 Abnormal results of thyroid function studies: Secondary | ICD-10-CM | POA: Diagnosis not present

## 2021-07-09 DIAGNOSIS — N189 Chronic kidney disease, unspecified: Secondary | ICD-10-CM | POA: Diagnosis not present

## 2021-07-09 DIAGNOSIS — I129 Hypertensive chronic kidney disease with stage 1 through stage 4 chronic kidney disease, or unspecified chronic kidney disease: Secondary | ICD-10-CM | POA: Diagnosis not present

## 2021-07-18 DIAGNOSIS — E1129 Type 2 diabetes mellitus with other diabetic kidney complication: Secondary | ICD-10-CM | POA: Diagnosis not present

## 2021-07-18 DIAGNOSIS — E6609 Other obesity due to excess calories: Secondary | ICD-10-CM | POA: Diagnosis not present

## 2021-07-18 DIAGNOSIS — I129 Hypertensive chronic kidney disease with stage 1 through stage 4 chronic kidney disease, or unspecified chronic kidney disease: Secondary | ICD-10-CM | POA: Diagnosis not present

## 2021-07-18 DIAGNOSIS — R809 Proteinuria, unspecified: Secondary | ICD-10-CM | POA: Diagnosis not present

## 2021-07-18 DIAGNOSIS — N189 Chronic kidney disease, unspecified: Secondary | ICD-10-CM | POA: Diagnosis not present

## 2021-07-18 DIAGNOSIS — E1122 Type 2 diabetes mellitus with diabetic chronic kidney disease: Secondary | ICD-10-CM | POA: Diagnosis not present

## 2021-07-18 DIAGNOSIS — N200 Calculus of kidney: Secondary | ICD-10-CM | POA: Diagnosis not present

## 2021-07-28 ENCOUNTER — Other Ambulatory Visit: Payer: Self-pay | Admitting: Gastroenterology

## 2021-07-30 NOTE — Telephone Encounter (Signed)
Last office visit 03/01/21

## 2021-08-10 DIAGNOSIS — E1122 Type 2 diabetes mellitus with diabetic chronic kidney disease: Secondary | ICD-10-CM | POA: Diagnosis not present

## 2021-08-10 DIAGNOSIS — E782 Mixed hyperlipidemia: Secondary | ICD-10-CM | POA: Diagnosis not present

## 2021-08-10 DIAGNOSIS — N182 Chronic kidney disease, stage 2 (mild): Secondary | ICD-10-CM | POA: Diagnosis not present

## 2021-08-13 DIAGNOSIS — E538 Deficiency of other specified B group vitamins: Secondary | ICD-10-CM | POA: Diagnosis not present

## 2021-09-10 DIAGNOSIS — E538 Deficiency of other specified B group vitamins: Secondary | ICD-10-CM | POA: Diagnosis not present

## 2021-10-05 DIAGNOSIS — H409 Unspecified glaucoma: Secondary | ICD-10-CM | POA: Diagnosis not present

## 2021-10-05 DIAGNOSIS — I10 Essential (primary) hypertension: Secondary | ICD-10-CM | POA: Diagnosis not present

## 2021-10-05 DIAGNOSIS — E782 Mixed hyperlipidemia: Secondary | ICD-10-CM | POA: Diagnosis not present

## 2021-10-05 DIAGNOSIS — G729 Myopathy, unspecified: Secondary | ICD-10-CM | POA: Diagnosis not present

## 2021-10-05 DIAGNOSIS — Z6830 Body mass index (BMI) 30.0-30.9, adult: Secondary | ICD-10-CM | POA: Diagnosis not present

## 2021-10-05 DIAGNOSIS — R946 Abnormal results of thyroid function studies: Secondary | ICD-10-CM | POA: Diagnosis not present

## 2021-10-05 DIAGNOSIS — Z0001 Encounter for general adult medical examination with abnormal findings: Secondary | ICD-10-CM | POA: Diagnosis not present

## 2021-10-05 DIAGNOSIS — E538 Deficiency of other specified B group vitamins: Secondary | ICD-10-CM | POA: Diagnosis not present

## 2021-10-05 DIAGNOSIS — E049 Nontoxic goiter, unspecified: Secondary | ICD-10-CM | POA: Diagnosis not present

## 2021-10-05 DIAGNOSIS — E1165 Type 2 diabetes mellitus with hyperglycemia: Secondary | ICD-10-CM | POA: Diagnosis not present

## 2021-10-05 DIAGNOSIS — Z1331 Encounter for screening for depression: Secondary | ICD-10-CM | POA: Diagnosis not present

## 2021-10-05 DIAGNOSIS — I700 Atherosclerosis of aorta: Secondary | ICD-10-CM | POA: Diagnosis not present

## 2021-10-10 DIAGNOSIS — E1122 Type 2 diabetes mellitus with diabetic chronic kidney disease: Secondary | ICD-10-CM | POA: Diagnosis not present

## 2021-10-10 DIAGNOSIS — N182 Chronic kidney disease, stage 2 (mild): Secondary | ICD-10-CM | POA: Diagnosis not present

## 2021-10-10 DIAGNOSIS — E782 Mixed hyperlipidemia: Secondary | ICD-10-CM | POA: Diagnosis not present

## 2021-10-11 DIAGNOSIS — E538 Deficiency of other specified B group vitamins: Secondary | ICD-10-CM | POA: Diagnosis not present

## 2021-10-17 DIAGNOSIS — E1122 Type 2 diabetes mellitus with diabetic chronic kidney disease: Secondary | ICD-10-CM | POA: Diagnosis not present

## 2021-10-17 DIAGNOSIS — I129 Hypertensive chronic kidney disease with stage 1 through stage 4 chronic kidney disease, or unspecified chronic kidney disease: Secondary | ICD-10-CM | POA: Diagnosis not present

## 2021-10-17 DIAGNOSIS — I10 Essential (primary) hypertension: Secondary | ICD-10-CM | POA: Diagnosis not present

## 2021-10-17 DIAGNOSIS — E1129 Type 2 diabetes mellitus with other diabetic kidney complication: Secondary | ICD-10-CM | POA: Diagnosis not present

## 2021-10-17 DIAGNOSIS — R809 Proteinuria, unspecified: Secondary | ICD-10-CM | POA: Diagnosis not present

## 2021-10-17 DIAGNOSIS — E7849 Other hyperlipidemia: Secondary | ICD-10-CM | POA: Diagnosis not present

## 2021-10-17 DIAGNOSIS — N200 Calculus of kidney: Secondary | ICD-10-CM | POA: Diagnosis not present

## 2021-10-17 DIAGNOSIS — E6609 Other obesity due to excess calories: Secondary | ICD-10-CM | POA: Diagnosis not present

## 2021-10-17 DIAGNOSIS — R946 Abnormal results of thyroid function studies: Secondary | ICD-10-CM | POA: Diagnosis not present

## 2021-10-17 DIAGNOSIS — N189 Chronic kidney disease, unspecified: Secondary | ICD-10-CM | POA: Diagnosis not present

## 2021-10-25 ENCOUNTER — Other Ambulatory Visit: Payer: Self-pay | Admitting: Nephrology

## 2021-10-25 ENCOUNTER — Other Ambulatory Visit (HOSPITAL_COMMUNITY): Payer: Self-pay | Admitting: Nephrology

## 2021-10-25 DIAGNOSIS — E1122 Type 2 diabetes mellitus with diabetic chronic kidney disease: Secondary | ICD-10-CM

## 2021-10-25 DIAGNOSIS — N189 Chronic kidney disease, unspecified: Secondary | ICD-10-CM | POA: Diagnosis not present

## 2021-10-25 DIAGNOSIS — R809 Proteinuria, unspecified: Secondary | ICD-10-CM

## 2021-10-25 DIAGNOSIS — I129 Hypertensive chronic kidney disease with stage 1 through stage 4 chronic kidney disease, or unspecified chronic kidney disease: Secondary | ICD-10-CM

## 2021-10-25 DIAGNOSIS — N170 Acute kidney failure with tubular necrosis: Secondary | ICD-10-CM

## 2021-10-25 DIAGNOSIS — E1129 Type 2 diabetes mellitus with other diabetic kidney complication: Secondary | ICD-10-CM | POA: Diagnosis not present

## 2021-10-25 DIAGNOSIS — N200 Calculus of kidney: Secondary | ICD-10-CM | POA: Diagnosis not present

## 2021-10-25 DIAGNOSIS — Z6831 Body mass index (BMI) 31.0-31.9, adult: Secondary | ICD-10-CM | POA: Diagnosis not present

## 2021-11-06 ENCOUNTER — Ambulatory Visit (HOSPITAL_COMMUNITY)
Admission: RE | Admit: 2021-11-06 | Discharge: 2021-11-06 | Disposition: A | Discharge status: Home / Self Care | Payer: PPO | Source: Ambulatory Visit | Attending: Nephrology | Admitting: Nephrology

## 2021-11-06 DIAGNOSIS — N200 Calculus of kidney: Secondary | ICD-10-CM | POA: Diagnosis not present

## 2021-11-06 DIAGNOSIS — N170 Acute kidney failure with tubular necrosis: Secondary | ICD-10-CM | POA: Diagnosis not present

## 2021-11-06 DIAGNOSIS — R809 Proteinuria, unspecified: Secondary | ICD-10-CM | POA: Diagnosis not present

## 2021-11-06 DIAGNOSIS — I129 Hypertensive chronic kidney disease with stage 1 through stage 4 chronic kidney disease, or unspecified chronic kidney disease: Secondary | ICD-10-CM | POA: Diagnosis not present

## 2021-11-06 DIAGNOSIS — E1122 Type 2 diabetes mellitus with diabetic chronic kidney disease: Secondary | ICD-10-CM

## 2021-11-06 DIAGNOSIS — E1129 Type 2 diabetes mellitus with other diabetic kidney complication: Secondary | ICD-10-CM | POA: Diagnosis not present

## 2021-11-06 DIAGNOSIS — N179 Acute kidney failure, unspecified: Secondary | ICD-10-CM | POA: Diagnosis not present

## 2021-11-12 DIAGNOSIS — E1122 Type 2 diabetes mellitus with diabetic chronic kidney disease: Secondary | ICD-10-CM | POA: Diagnosis not present

## 2021-11-12 DIAGNOSIS — E1129 Type 2 diabetes mellitus with other diabetic kidney complication: Secondary | ICD-10-CM | POA: Diagnosis not present

## 2021-11-12 DIAGNOSIS — N170 Acute kidney failure with tubular necrosis: Secondary | ICD-10-CM | POA: Diagnosis not present

## 2021-11-12 DIAGNOSIS — I129 Hypertensive chronic kidney disease with stage 1 through stage 4 chronic kidney disease, or unspecified chronic kidney disease: Secondary | ICD-10-CM | POA: Diagnosis not present

## 2021-11-12 DIAGNOSIS — N189 Chronic kidney disease, unspecified: Secondary | ICD-10-CM | POA: Diagnosis not present

## 2021-11-12 DIAGNOSIS — R809 Proteinuria, unspecified: Secondary | ICD-10-CM | POA: Diagnosis not present

## 2021-11-12 DIAGNOSIS — N200 Calculus of kidney: Secondary | ICD-10-CM | POA: Diagnosis not present

## 2021-11-12 DIAGNOSIS — E538 Deficiency of other specified B group vitamins: Secondary | ICD-10-CM | POA: Diagnosis not present

## 2021-11-15 DIAGNOSIS — E1122 Type 2 diabetes mellitus with diabetic chronic kidney disease: Secondary | ICD-10-CM | POA: Diagnosis not present

## 2021-11-15 DIAGNOSIS — I129 Hypertensive chronic kidney disease with stage 1 through stage 4 chronic kidney disease, or unspecified chronic kidney disease: Secondary | ICD-10-CM | POA: Diagnosis not present

## 2021-11-15 DIAGNOSIS — N200 Calculus of kidney: Secondary | ICD-10-CM | POA: Diagnosis not present

## 2021-11-15 DIAGNOSIS — N189 Chronic kidney disease, unspecified: Secondary | ICD-10-CM | POA: Diagnosis not present

## 2021-11-15 DIAGNOSIS — N170 Acute kidney failure with tubular necrosis: Secondary | ICD-10-CM | POA: Diagnosis not present

## 2021-11-24 ENCOUNTER — Other Ambulatory Visit: Payer: Self-pay

## 2021-11-24 ENCOUNTER — Observation Stay (HOSPITAL_COMMUNITY): Payer: PPO

## 2021-11-24 ENCOUNTER — Inpatient Hospital Stay (HOSPITAL_COMMUNITY)
Admission: EM | Admit: 2021-11-24 | Discharge: 2021-11-26 | DRG: 684 | Disposition: A | Discharge status: Home / Self Care | Payer: PPO | Attending: Internal Medicine | Admitting: Internal Medicine

## 2021-11-24 ENCOUNTER — Encounter (HOSPITAL_COMMUNITY): Payer: Self-pay | Admitting: Emergency Medicine

## 2021-11-24 DIAGNOSIS — E1122 Type 2 diabetes mellitus with diabetic chronic kidney disease: Secondary | ICD-10-CM | POA: Diagnosis present

## 2021-11-24 DIAGNOSIS — Z87891 Personal history of nicotine dependence: Secondary | ICD-10-CM | POA: Diagnosis not present

## 2021-11-24 DIAGNOSIS — E876 Hypokalemia: Secondary | ICD-10-CM | POA: Diagnosis not present

## 2021-11-24 DIAGNOSIS — A084 Viral intestinal infection, unspecified: Secondary | ICD-10-CM | POA: Diagnosis present

## 2021-11-24 DIAGNOSIS — J449 Chronic obstructive pulmonary disease, unspecified: Secondary | ICD-10-CM

## 2021-11-24 DIAGNOSIS — K449 Diaphragmatic hernia without obstruction or gangrene: Secondary | ICD-10-CM | POA: Diagnosis not present

## 2021-11-24 DIAGNOSIS — N1832 Chronic kidney disease, stage 3b: Secondary | ICD-10-CM | POA: Diagnosis not present

## 2021-11-24 DIAGNOSIS — Z888 Allergy status to other drugs, medicaments and biological substances status: Secondary | ICD-10-CM | POA: Diagnosis not present

## 2021-11-24 DIAGNOSIS — R112 Nausea with vomiting, unspecified: Principal | ICD-10-CM

## 2021-11-24 DIAGNOSIS — M109 Gout, unspecified: Secondary | ICD-10-CM | POA: Diagnosis present

## 2021-11-24 DIAGNOSIS — E669 Obesity, unspecified: Secondary | ICD-10-CM | POA: Diagnosis not present

## 2021-11-24 DIAGNOSIS — R197 Diarrhea, unspecified: Secondary | ICD-10-CM | POA: Diagnosis not present

## 2021-11-24 DIAGNOSIS — Z96651 Presence of right artificial knee joint: Secondary | ICD-10-CM | POA: Diagnosis present

## 2021-11-24 DIAGNOSIS — Z6830 Body mass index (BMI) 30.0-30.9, adult: Secondary | ICD-10-CM | POA: Diagnosis not present

## 2021-11-24 DIAGNOSIS — Z96643 Presence of artificial hip joint, bilateral: Secondary | ICD-10-CM | POA: Diagnosis not present

## 2021-11-24 DIAGNOSIS — Z886 Allergy status to analgesic agent status: Secondary | ICD-10-CM

## 2021-11-24 DIAGNOSIS — N200 Calculus of kidney: Secondary | ICD-10-CM

## 2021-11-24 DIAGNOSIS — M1990 Unspecified osteoarthritis, unspecified site: Secondary | ICD-10-CM | POA: Diagnosis present

## 2021-11-24 DIAGNOSIS — Z7984 Long term (current) use of oral hypoglycemic drugs: Secondary | ICD-10-CM | POA: Diagnosis not present

## 2021-11-24 DIAGNOSIS — N179 Acute kidney failure, unspecified: Secondary | ICD-10-CM | POA: Diagnosis not present

## 2021-11-24 DIAGNOSIS — I129 Hypertensive chronic kidney disease with stage 1 through stage 4 chronic kidney disease, or unspecified chronic kidney disease: Secondary | ICD-10-CM | POA: Diagnosis present

## 2021-11-24 DIAGNOSIS — Z885 Allergy status to narcotic agent status: Secondary | ICD-10-CM | POA: Diagnosis not present

## 2021-11-24 DIAGNOSIS — K219 Gastro-esophageal reflux disease without esophagitis: Secondary | ICD-10-CM | POA: Diagnosis not present

## 2021-11-24 DIAGNOSIS — H409 Unspecified glaucoma: Secondary | ICD-10-CM | POA: Diagnosis not present

## 2021-11-24 DIAGNOSIS — E785 Hyperlipidemia, unspecified: Secondary | ICD-10-CM | POA: Diagnosis not present

## 2021-11-24 DIAGNOSIS — Z79899 Other long term (current) drug therapy: Secondary | ICD-10-CM

## 2021-11-24 DIAGNOSIS — Z87442 Personal history of urinary calculi: Secondary | ICD-10-CM

## 2021-11-24 DIAGNOSIS — K6389 Other specified diseases of intestine: Secondary | ICD-10-CM | POA: Diagnosis not present

## 2021-11-24 DIAGNOSIS — E119 Type 2 diabetes mellitus without complications: Secondary | ICD-10-CM

## 2021-11-24 LAB — COMPREHENSIVE METABOLIC PANEL
ALT: 13 U/L (ref 0–44)
AST: 9 U/L — ABNORMAL LOW (ref 15–41)
Albumin: 3.7 g/dL (ref 3.5–5.0)
Alkaline Phosphatase: 72 U/L (ref 38–126)
Anion gap: 11 (ref 5–15)
BUN: 89 mg/dL — ABNORMAL HIGH (ref 8–23)
CO2: 16 mmol/L — ABNORMAL LOW (ref 22–32)
Calcium: 9.9 mg/dL (ref 8.9–10.3)
Chloride: 113 mmol/L — ABNORMAL HIGH (ref 98–111)
Creatinine, Ser: 3.28 mg/dL — ABNORMAL HIGH (ref 0.44–1.00)
GFR, Estimated: 15 mL/min — ABNORMAL LOW (ref >60)
Glucose, Bld: 185 mg/dL — ABNORMAL HIGH (ref 70–99)
Potassium: 3.4 mmol/L — ABNORMAL LOW (ref 3.5–5.1)
Sodium: 140 mmol/L (ref 135–145)
Total Bilirubin: 0.4 mg/dL (ref 0.3–1.2)
Total Protein: 7.4 g/dL (ref 6.5–8.1)

## 2021-11-24 LAB — CBC
HCT: 39.3 % (ref 36.0–46.0)
HCT: 37.6 % (ref 36.0–46.0)
Hemoglobin: 12.4 g/dL (ref 12.0–15.0)
Hemoglobin: 11.8 g/dL — ABNORMAL LOW (ref 12.0–15.0)
MCH: 30.2 pg (ref 26.0–34.0)
MCH: 30.4 pg (ref 26.0–34.0)
MCHC: 31.6 g/dL (ref 30.0–36.0)
MCHC: 31.4 g/dL (ref 30.0–36.0)
MCV: 95.6 fL (ref 80.0–100.0)
MCV: 96.9 fL (ref 80.0–100.0)
Platelets: 291 10*3/uL (ref 150–400)
Platelets: 224 10*3/uL (ref 150–400)
RBC: 4.11 MIL/uL (ref 3.87–5.11)
RBC: 3.88 MIL/uL (ref 3.87–5.11)
RDW: 14.2 % (ref 11.5–15.5)
RDW: 14.3 % (ref 11.5–15.5)
WBC: 13.5 10*3/uL — ABNORMAL HIGH (ref 4.0–10.5)
WBC: 11.4 10*3/uL — ABNORMAL HIGH (ref 4.0–10.5)
nRBC: 0 % (ref 0.0–0.2)
nRBC: 0 % (ref 0.0–0.2)

## 2021-11-24 LAB — URINALYSIS, ROUTINE W REFLEX MICROSCOPIC
Bacteria, UA: NONE SEEN
Bilirubin Urine: NEGATIVE
Glucose, UA: NEGATIVE mg/dL
Hgb urine dipstick: NEGATIVE
Ketones, ur: NEGATIVE mg/dL
Nitrite: NEGATIVE
Protein, ur: NEGATIVE mg/dL
Specific Gravity, Urine: 1.016 (ref 1.005–1.030)
pH: 5 (ref 5.0–8.0)

## 2021-11-24 LAB — GLUCOSE, CAPILLARY
Glucose-Capillary: 144 mg/dL — ABNORMAL HIGH (ref 70–99)
Glucose-Capillary: 154 mg/dL — ABNORMAL HIGH (ref 70–99)
Glucose-Capillary: 108 mg/dL — ABNORMAL HIGH (ref 70–99)

## 2021-11-24 LAB — HEMOGLOBIN A1C
Hgb A1c MFr Bld: 6.2 % — ABNORMAL HIGH (ref 4.8–5.6)
Mean Plasma Glucose: 131.24 mg/dL

## 2021-11-24 LAB — CREATININE, SERUM
Creatinine, Ser: 2.89 mg/dL — ABNORMAL HIGH (ref 0.44–1.00)
GFR, Estimated: 17 mL/min — ABNORMAL LOW (ref >60)

## 2021-11-24 LAB — HIV ANTIBODY (ROUTINE TESTING W REFLEX): HIV Screen 4th Generation wRfx: NONREACTIVE

## 2021-11-24 LAB — LIPASE, BLOOD: Lipase: 30 U/L (ref 11–51)

## 2021-11-24 MED ORDER — LATANOPROST 0.005 % OP SOLN
1.0000 [drp] | Freq: Every day | OPHTHALMIC | Status: DC
  Administered 2021-11-24 – 2021-11-25 (×2): 1 [drp] via OPHTHALMIC
  Filled 2021-11-24 (×2): qty 2.5

## 2021-11-24 MED ORDER — LINAGLIPTIN 5 MG PO TABS
5.0000 mg | ORAL_TABLET | Freq: Every day | ORAL | Status: DC
  Administered 2021-11-24 – 2021-11-26 (×3): 5 mg via ORAL
  Filled 2021-11-24 (×3): qty 1

## 2021-11-24 MED ORDER — FENTANYL CITRATE PF 50 MCG/ML IJ SOSY
25.0000 ug | PREFILLED_SYRINGE | Freq: Once | INTRAMUSCULAR | Status: AC
  Administered 2021-11-24: 25 ug via INTRAVENOUS
  Filled 2021-11-24: qty 1

## 2021-11-24 MED ORDER — LACTATED RINGERS IV BOLUS
1000.0000 mL | Freq: Once | INTRAVENOUS | Status: AC
  Administered 2021-11-24: 1000 mL via INTRAVENOUS

## 2021-11-24 MED ORDER — ONDANSETRON HCL 4 MG PO TABS: 4.0000 mg | ORAL_TABLET | Freq: Four times a day (QID) | ORAL | Status: DC | PRN

## 2021-11-24 MED ORDER — INSULIN ASPART 100 UNIT/ML IJ SOLN
0.0000 [IU] | Freq: Three times a day (TID) | INTRAMUSCULAR | Status: DC
  Administered 2021-11-24: 1 [IU] via SUBCUTANEOUS
  Administered 2021-11-24: 2 [IU] via SUBCUTANEOUS
  Administered 2021-11-25 (×2): 1 [IU] via SUBCUTANEOUS

## 2021-11-24 MED ORDER — HEPARIN SODIUM (PORCINE) 5000 UNIT/ML IJ SOLN
5000.0000 [IU] | Freq: Three times a day (TID) | INTRAMUSCULAR | Status: DC
Start: 2021-11-24 — End: 2021-11-26
  Administered 2021-11-24 – 2021-11-26 (×7): 5000 [IU] via SUBCUTANEOUS
  Filled 2021-11-24 (×7): qty 1

## 2021-11-24 MED ORDER — HYDROMORPHONE HCL 1 MG/ML IJ SOLN: 0.5000 mg | INTRAMUSCULAR | Status: DC | PRN

## 2021-11-24 MED ORDER — SODIUM CHLORIDE 0.9 % IV BOLUS
1000.0000 mL | Freq: Once | INTRAVENOUS | Status: AC
  Administered 2021-11-24: 1000 mL via INTRAVENOUS

## 2021-11-24 MED ORDER — OMEGA-3-ACID ETHYL ESTERS 1 G PO CAPS
1.0000 g | ORAL_CAPSULE | Freq: Two times a day (BID) | ORAL | Status: DC
Start: 2021-11-24 — End: 2021-11-26
  Administered 2021-11-24 – 2021-11-26 (×5): 1 g via ORAL
  Filled 2021-11-24 (×5): qty 1

## 2021-11-24 MED ORDER — PANTOPRAZOLE SODIUM 40 MG PO TBEC
40.0000 mg | DELAYED_RELEASE_TABLET | Freq: Every day | ORAL | Status: DC
  Administered 2021-11-24 – 2021-11-26 (×3): 40 mg via ORAL
  Filled 2021-11-24 (×3): qty 1

## 2021-11-24 MED ORDER — AMLODIPINE BESYLATE 5 MG PO TABS
10.0000 mg | ORAL_TABLET | Freq: Every day | ORAL | Status: DC
  Administered 2021-11-25 – 2021-11-26 (×2): 10 mg via ORAL
  Filled 2021-11-24 (×3): qty 2

## 2021-11-24 MED ORDER — ACETAMINOPHEN 650 MG RE SUPP: 650.0000 mg | Freq: Four times a day (QID) | RECTAL | Status: DC | PRN

## 2021-11-24 MED ORDER — HYDRALAZINE HCL 20 MG/ML IJ SOLN: 10.0000 mg | INTRAMUSCULAR | Status: DC | PRN

## 2021-11-24 MED ORDER — PROCHLORPERAZINE EDISYLATE 10 MG/2ML IJ SOLN: 10.0000 mg | Freq: Four times a day (QID) | INTRAMUSCULAR | Status: DC | PRN

## 2021-11-24 MED ORDER — EZETIMIBE 10 MG PO TABS
10.0000 mg | ORAL_TABLET | Freq: Every day | ORAL | Status: DC
  Administered 2021-11-24 – 2021-11-26 (×3): 10 mg via ORAL
  Filled 2021-11-24 (×3): qty 1

## 2021-11-24 MED ORDER — ONDANSETRON HCL 4 MG/2ML IJ SOLN
4.0000 mg | Freq: Once | INTRAMUSCULAR | Status: AC
  Administered 2021-11-24: 4 mg via INTRAVENOUS
  Filled 2021-11-24: qty 2

## 2021-11-24 MED ORDER — ACETAMINOPHEN 325 MG PO TABS: 650.0000 mg | ORAL_TABLET | Freq: Four times a day (QID) | ORAL | Status: DC | PRN

## 2021-11-24 MED ORDER — INSULIN ASPART 100 UNIT/ML IJ SOLN: 0.0000 [IU] | Freq: Every day | INTRAMUSCULAR | Status: DC

## 2021-11-24 MED ORDER — ONDANSETRON HCL 4 MG/2ML IJ SOLN
4.0000 mg | Freq: Four times a day (QID) | INTRAMUSCULAR | Status: DC | PRN
  Administered 2021-11-24: 4 mg via INTRAVENOUS
  Filled 2021-11-24: qty 2

## 2021-11-24 MED ORDER — POTASSIUM CHLORIDE CRYS ER 20 MEQ PO TBCR
40.0000 meq | EXTENDED_RELEASE_TABLET | Freq: Once | ORAL | Status: AC
Start: 2021-11-24 — End: 2021-11-24
  Administered 2021-11-24: 40 meq via ORAL
  Filled 2021-11-24: qty 2

## 2021-11-24 MED ORDER — LACTATED RINGERS IV SOLN
INTRAVENOUS | Status: AC
  Administered 2021-11-24: 1000 mL via INTRAVENOUS

## 2021-11-24 MED ORDER — FAMOTIDINE IN NACL 20-0.9 MG/50ML-% IV SOLN
20.0000 mg | Freq: Once | INTRAVENOUS | Status: AC
  Administered 2021-11-24: 20 mg via INTRAVENOUS
  Filled 2021-11-24: qty 50

## 2021-11-24 MED ORDER — LOPERAMIDE HCL 2 MG PO CAPS
2.0000 mg | ORAL_CAPSULE | ORAL | Status: DC | PRN
Start: 2021-11-24 — End: 2021-11-26

## 2021-11-24 MED ORDER — ROSUVASTATIN CALCIUM 10 MG PO TABS
5.0000 mg | ORAL_TABLET | Freq: Every day | ORAL | Status: DC
  Administered 2021-11-24 – 2021-11-25 (×2): 5 mg via ORAL
  Filled 2021-11-24 (×2): qty 1

## 2021-11-24 MED ORDER — ALLOPURINOL 300 MG PO TABS
300.0000 mg | ORAL_TABLET | Freq: Every evening | ORAL | Status: DC
  Administered 2021-11-24 – 2021-11-25 (×2): 300 mg via ORAL
  Filled 2021-11-24 (×2): qty 1

## 2021-11-24 MED ORDER — PROMETHAZINE HCL 25 MG/ML IJ SOLN
12.5000 mg | Freq: Four times a day (QID) | INTRAMUSCULAR | Status: DC | PRN
Start: 2021-11-24 — End: 2021-11-24
  Administered 2021-11-24: 12.5 mg via INTRAVENOUS
  Filled 2021-11-24 (×2): qty 0.5

## 2021-11-24 MED ORDER — CARVEDILOL 3.125 MG PO TABS
6.2500 mg | ORAL_TABLET | Freq: Two times a day (BID) | ORAL | Status: DC
  Administered 2021-11-24 – 2021-11-26 (×4): 6.25 mg via ORAL
  Filled 2021-11-24 (×5): qty 2

## 2021-11-24 NOTE — Plan of Care (Signed)

## 2021-11-24 NOTE — ED Provider Notes (Signed)
[facility] Emergency Department Provider Note MRN:  [id number]  Arrival date & time: 11/24/21     Chief Complaint   Emesis and Diarrhea   History of Present Illness   Gwendolyn Snyder is a 71 y.o. year-old female with a history of hypertension, diabetes presenting to the ED with chief complaint of nausea vomiting diarrhea.  Nausea vomiting and diarrhea since 5 PM yesterday.  Experiencing some total body cramping/soreness.  Otherwise no symptoms.  No fever, no chest pain or shortness of breath, no rash.  No recent antibiotics, no blood in the stool, no recent travel.  No headache or vision change.  Review of Systems  A thorough review of systems was obtained and all systems are negative except as noted in the HPI and PMH.   Patient's Health History    Past Medical History:  Diagnosis Date   Anemia    Anxiety    Arthritis    Bilateral carpal tunnel syndrome    right hand- surgery , left hand has not and left hand goes to sleep    Cataracts, bilateral    Complication of anesthesia    once after surgery felt like she could not breathe- 11/09/15- surgery greater than 11 years ago   COPD (chronic obstructive pulmonary disease) (HCC)    Diabetes mellitus without complication (HCC)    diet controlled , type II    Family history of adverse reaction to anesthesia    sister- confused   Glaucoma    History of hiatal hernia    History of kidney stones    Hypertension    Renal disorder    Shortness of breath dyspnea    with exertion    Past Surgical History:  Procedure Laterality Date   ABDOMINAL HYSTERECTOMY     BIOPSY  10/24/2020   Procedure: BIOPSY;  Surgeon: [name], DO;  [facility];  Service: Endoscopy;;   CARPAL TUNNEL RELEASE Right    COLONOSCOPY     COLONOSCOPY N/A 01/05/2019   normal   CYSTOSCOPY/RETROGRADE/URETEROSCOPY/STONE EXTRACTION WITH BASKET Left 09/17/2016   Procedure: CYSTOSCOPY/RETROGRADE/URETEROSCOPY/STONE  EXTRACTION WITH BASKET/ STENT REMOVAL;  Surgeon: Bjorn Pippin, MD;  [facility];  Service: Urology;  Laterality: Left;   ESOPHAGOGASTRODUODENOSCOPY (EGD) WITH PROPOFOL N/A 10/24/2020   large hiatal hernia, gastritis s/p biopsy. Normal duodenum. Negative H.pylori.   EXTRACORPOREAL SHOCK WAVE LITHOTRIPSY Left 11/16/2018   Procedure: EXTRACORPOREAL SHOCK WAVE LITHOTRIPSY (ESWL);  Surgeon: Crista Elliot, MD;  Location: [facility];  Service: Urology;  Laterality: Left;   EXTRACORPOREAL SHOCK WAVE LITHOTRIPSY Left 01/21/2019   Procedure: EXTRACORPOREAL SHOCK WAVE LITHOTRIPSY (ESWL);  Surgeon: Bjorn Pippin, MD;  [facility];  Service: Urology;  Laterality: Left;   IR URETERAL STENT LEFT NEW ACCESS W/O SEP NEPHROSTOMY CATH  08/20/2016   JOINT REPLACEMENT     NEPHROLITHOTOMY Left 08/20/2016   Procedure: LEFT NEPHROLITHOTOMY PERCUTANEOUS FIRST STAGE;  Surgeon: Bjorn Pippin, MD;  Location[facility];  Service: Urology;  Laterality: Left;   NEPHROLITHOTOMY Left 08/22/2016   Procedure: LEFT NEPHROLITHOTOMY PERCUTANEOUS SECOND LOOK;  Surgeon: Bjorn Pippin, MD;  Location[facility];  Service: Urology;  Laterality: Left;   Removal kidney stone     TOTAL HIP ARTHROPLASTY Bilateral    TOTAL HIP REVISION Left 11/22/2015   Procedure: Revision Left Total Hip Arthroplasty, Poly and Newman Pies Exchange;  Surgeon: Eldred Manges, MD;  Location: [facility] OR;  Service: Orthopedics;  Laterality: Left;   TOTAL KNEE ARTHROPLASTY Right  Family History  Problem Relation Age of Onset   Colon cancer Neg Hx    Colon polyps Neg Hx     Social History   Socioeconomic History   Marital status: Single    Spouse name: Not on file   Number of children: Not on file   Years of education: Not on file   Highest education level: Not on file  Occupational History   Not on file  Tobacco Use   Smoking status: Former    Packs/day: 1.00    Years: 30.00    Total pack years: 30.00    Types: Cigarettes    Quit date: 05/13/2004    Years since  quitting: 17.5   Smokeless tobacco: Never  Vaping Use   Vaping Use: Never used  Substance and Sexual Activity   Alcohol use: No    Alcohol/week: 0.0 standard drinks of alcohol   Drug use: No   Sexual activity: Not on file  Other Topics Concern   Not on file  Social History Narrative   Not on file   Social Determinants of Health   Financial Resource Strain: Not on file  Food Insecurity: Not on file  Transportation Needs: Not on file  Physical Activity: Not on file  Stress: Not on file  Social Connections: Not on file  Intimate Partner Violence: Not on file     Physical Exam   Vitals:   11/24/21 0624  BP: (!) 104/56  Pulse: 93  Resp: (!) 24  Temp: 98.2 F (36.8 C)  SpO2: 97%    CONSTITUTIONAL: Well-appearing, NAD NEURO/PSYCH:  Alert and oriented x 3, no focal deficits EYES:  eyes equal and reactive ENT/NECK:  no LAD, no JVD CARDIO: Regular rate, well-perfused, normal S1 and S2 PULM:  CTAB no wheezing or rhonchi GI/GU:  non-distended, non-tender MSK/SPINE:  No gross deformities, no edema SKIN:  no rash, atraumatic   *Additional and/or pertinent findings included in MDM below  Diagnostic and Interventional Summary    EKG Interpretation  Date/Time:    Ventricular Rate:    PR Interval:    QRS Duration:   QT Interval:    QTC Calculation:   R Axis:     Text Interpretation:         Labs Reviewed  LIPASE, BLOOD  COMPREHENSIVE METABOLIC PANEL  CBC  URINALYSIS, ROUTINE W REFLEX MICROSCOPIC    No orders to display    Medications  lactated ringers bolus 1,000 mL (has no administration in time range)  ondansetron (ZOFRAN) injection 4 mg (has no administration in time range)  fentaNYL (SUBLIMAZE) injection 25 mcg (has no administration in time range)  famotidine (PEPCID) IVPB 20 mg premix (has no administration in time range)     Procedures  /  Critical Care Procedures  ED Course and Medical Decision Making  Initial Impression and Ddx Abdomen soft  and nontender with no rebound guarding or rigidity.  Well-appearing, normal vital signs, suspect benign process such as viral gastroenteritis or food poisoning.  Providing fluids, symptomatic management, will check labs for electrolyte disturbance or AKI.  Past medical/surgical history that increases complexity of ED encounter: None  Interpretation of Diagnostics Labs pending  Patient Reassessment and Ultimate Disposition/Management     Awaiting labs, will need reassessment, signed out to oncoming provider at shift change.  Patient management required discussion with the following services or consulting groups:  None  Complexity of Problems Addressed Acute illness or injury that poses threat of life of bodily function  Additional Data Reviewed and Analyzed Further history obtained from: None  Additional Factors Impacting ED Encounter Risk Use of parenteral controlled substances and Consideration of hospitalization  Barth Kirks. [place] Small, [place] [email or web address]  Final Clinical Impressions(s) / ED Diagnoses     ICD-10-CM   1. Nausea vomiting and diarrhea  R11.2    R19.7       ED Discharge Orders     None        Discharge Instructions Discussed with and Provided to Patient:   Discharge Instructions   None      Maudie Flakes, MD 11/24/21 [phone]

## 2021-11-24 NOTE — ED Triage Notes (Signed)
Pt states she vomited 7-8 times last night and had diarrhea that was "like hot water" at the same time.

## 2021-11-24 NOTE — H&P (Signed)
History and Physical    Gwendolyn Snyder WGN:[id number] DOB: 07/31/1950 DOA: 11/24/2021  PCP: Assunta Found, MD   Patient coming from: Home  Chief Complaint: N/V/D  HPI: Gwendolyn Snyder is a 71 y.o. female with medical history significant for obesity, COPD, hypertension, type 2 diabetes, gout, dyslipidemia, GERD and CKD stage IIIb who presented to the ED with complaints of nausea and vomiting as well as diarrhea that began yesterday evening.  She states that she has had little p.o. intake over the last 48 hours since she was not feeling well.  She has been trying to take her home medications otherwise.  She denies any fevers, chills, sick contacts or any strange food consumption.  She states that at baseline she tends to have liquidy stools and was recently started back on her lisinopril by her nephrologist.   ED Course: Stable vital signs noted and patient is afebrile.  Mild leukocytosis of 13,500 noted and potassium 3.4.  BUN 89 creatinine 3.28 with baseline near 1.5.  Patient started on IV fluid.  Review of Systems: Reviewed as noted above, otherwise negative.  Past Medical History:  Diagnosis Date   Anemia    Anxiety    Arthritis    Bilateral carpal tunnel syndrome    right hand- surgery , left hand has not and left hand goes to sleep    Cataracts, bilateral    Complication of anesthesia    once after surgery felt like she could not breathe- 11/09/15- surgery greater than 11 years ago   COPD (chronic obstructive pulmonary disease) (HCC)    Diabetes mellitus without complication (HCC)    diet controlled , type II    Family history of adverse reaction to anesthesia    sister- confused   Glaucoma    History of hiatal hernia    History of kidney stones    Hypertension    Renal disorder    Shortness of breath dyspnea    with exertion    Past Surgical History:  Procedure Laterality Date   ABDOMINAL HYSTERECTOMY     BIOPSY  10/24/2020   Procedure: BIOPSY;  Surgeon: [name], DO;  Location: [facility];  Service: Endoscopy;;   CARPAL TUNNEL RELEASE Right    COLONOSCOPY     COLONOSCOPY N/A 01/05/2019   normal   CYSTOSCOPY/RETROGRADE/URETEROSCOPY/STONE EXTRACTION WITH BASKET Left 09/17/2016   Procedure: CYSTOSCOPY/RETROGRADE/URETEROSCOPY/STONE EXTRACTION WITH BASKET/ STENT REMOVAL;  Surgeon: Bjorn Pippin, MD;  Location: [facility];  Service: Urology;  Laterality: Left;   ESOPHAGOGASTRODUODENOSCOPY (EGD) WITH PROPOFOL N/A 10/24/2020   large hiatal hernia, gastritis s/p biopsy. Normal duodenum. Negative H.pylori.   EXTRACORPOREAL SHOCK WAVE LITHOTRIPSY Left 11/16/2018   Procedure: EXTRACORPOREAL SHOCK WAVE LITHOTRIPSY (ESWL);  Surgeon: Crista Elliot, MD;  Location: [facility];  Service: Urology;  Laterality: Left;   EXTRACORPOREAL SHOCK WAVE LITHOTRIPSY Left 01/21/2019   Procedure: EXTRACORPOREAL SHOCK WAVE LITHOTRIPSY (ESWL);  Surgeon: Bjorn Pippin, MD;  Location: [facility];  Service: Urology;  Laterality: Left;   IR URETERAL STENT LEFT NEW ACCESS W/O SEP NEPHROSTOMY CATH  08/20/2016   JOINT REPLACEMENT     NEPHROLITHOTOMY Left 08/20/2016   Procedure: LEFT NEPHROLITHOTOMY PERCUTANEOUS FIRST STAGE;  Surgeon: Bjorn Pippin, MD;  Location: [facility];  Service: Urology;  Laterality: Left;   NEPHROLITHOTOMY Left 08/22/2016   Procedure: LEFT NEPHROLITHOTOMY PERCUTANEOUS SECOND LOOK;  Surgeon: Bjorn Pippin, MD;  Location: [facility];  Service: Urology;  Laterality: Left;   Removal kidney stone  TOTAL HIP ARTHROPLASTY Bilateral    TOTAL HIP REVISION Left 11/22/2015   Procedure: Revision Left Total Hip Arthroplasty, Poly and Diona Foley Exchange;  Surgeon: Marybelle Killings, MD;  Location: [facility];  Service: Orthopedics;  Laterality: Left;   TOTAL KNEE ARTHROPLASTY Right      reports that she quit smoking about 17 years ago. Her smoking use included cigarettes. She has a 30.00 pack-year smoking history. She has never used smokeless tobacco. She reports that she does not drink alcohol and  does not use drugs.  Allergies  Allergen Reactions   Aspirin Other (See Comments)    Makes ears ring and heart beat fast   Codeine Other (See Comments)    Hard to wake up   Flomax [Tamsulosin Hcl] Nausea And Vomiting   Pravastatin     Myalgias     Family History  Problem Relation Age of Onset   Colon cancer Neg Hx    Colon polyps Neg Hx     Prior to Admission medications   Medication Sig Start Date End Date Taking? Authorizing Provider  acetaminophen (TYLENOL) 650 MG CR tablet Take 1,300 mg by mouth 2 (two) times daily.    [provider]  allopurinol (ZYLOPRIM) 300 MG tablet Take 1 tablet (300 mg total) by mouth every evening. 06/07/21   Irine Seal, MD  amLODipine (NORVASC) 10 MG tablet Take 10 mg by mouth daily. 08/31/20   [provider]  carvedilol (COREG) 3.125 MG tablet Take 6.25 mg by mouth 2 (two) times daily. 02/09/21   [provider]  cholecalciferol (VITAMIN D) 1000 units tablet Take 1,000 Units by mouth daily.    [provider]  Coenzyme Q10 (CO Q 10) 100 MG CAPS Take 100 mg by mouth daily.    [provider]  cyanocobalamin (,VITAMIN B-12,) 1000 MCG/ML injection Inject 1,000 mcg into the skin every 30 (thirty) days.    [provider]  ezetimibe (ZETIA) 10 MG tablet Take 10 mg by mouth daily.    [provider]  FREESTYLE LITE test strip Use to test blood sugar once daily 08/11/20   [provider]  Inositol Niacinate 500 MG CAPS Take 500 mg by mouth 2 (two) times daily.     [provider]  latanoprost (XALATAN) 0.005 % ophthalmic solution Place 1 drop into both eyes at bedtime. 10/05/20   [provider]  lisinopril (PRINIVIL,ZESTRIL) 20 MG tablet Take 20 mg by mouth 2 (two) times daily.  07/10/15   [provider]  omega-3 acid ethyl esters (LOVAZA) 1 g capsule Take 1 g by mouth 2 (two) times daily.  09/25/18   [provider]  pantoprazole (PROTONIX) 40 MG tablet  TAKE ONE TABLET BY MOUTH ONCE DAILY 07/30/21   Annitta Needs, NP  Potassium Citrate 15 MEQ (1620 MG) TBCR Take 2 tabs qam and 1 tab qpm po Patient taking differently: Take 1 tablet by mouth in the morning and at bedtime. 06/08/20   Irine Seal, MD  rosuvastatin (CRESTOR) 5 MG tablet Take 5 mg by mouth at bedtime. 08/11/20   [provider]  sitaGLIPtin (JANUVIA) 100 MG tablet Take 1 tablet (100 mg total) by mouth daily.    [provider]  famotidine (PEPCID) 20 MG tablet Take 1 tablet (20 mg total) by mouth 2 (two) times daily. 09/20/20 09/21/20  Daleen Bo, MD  omeprazole (PRILOSEC) 20 MG capsule Take 1 capsule (20 mg total) by mouth daily. 08/28/20 09/20/20  Rancour, Annie Main,  MD    Physical Exam: Vitals:   11/24/21 0620 11/24/21 0624 11/24/21 0730  BP:  (!) 104/56 106/65  Pulse:  93 80  Resp:  (!) 24 19  Temp:  98.2 F (36.8 C)   TempSrc:  Oral   SpO2:  97% 100%  Weight: 78.9 kg    Height: 5\' 3"  (1.6 m)      Constitutional: NAD, calm, comfortable, obese Vitals:   11/24/21 0620 11/24/21 0624 11/24/21 0730  BP:  (!) 104/56 106/65  Pulse:  93 80  Resp:  (!) 24 19  Temp:  98.2 F (36.8 C)   TempSrc:  Oral   SpO2:  97% 100%  Weight: 78.9 kg    Height: 5\' 3"  (1.6 m)     Eyes: lids and conjunctivae normal Neck: normal, supple Respiratory: clear to auscultation bilaterally. Normal respiratory effort. No accessory muscle use.  Cardiovascular: Regular rate and rhythm, no murmurs. Abdomen: no tenderness, no distention. Bowel sounds positive.  Musculoskeletal:  No edema. Skin: no rashes, lesions, ulcers.  Psychiatric: Flat affect  Labs on Admission: I have personally reviewed following labs and imaging studies  CBC: Recent Labs  Lab 11/24/21 0644  WBC 13.5*  HGB 12.4  HCT 39.3  MCV 95.6  PLT [id number]   Basic Metabolic Panel: Recent Labs  Lab 11/24/21 0644  NA 140  K 3.4*  CL 113*  CO2 16*  GLUCOSE 185*  BUN 89*  CREATININE 3.28*  CALCIUM 9.9    GFR: Estimated Creatinine Clearance: 15.9 mL/min (A) (by C-G formula based on SCr of 3.28 mg/dL (H)). Liver Function Tests: Recent Labs  Lab 11/24/21 0644  AST 9*  ALT 13  ALKPHOS 72  BILITOT 0.4  PROT 7.4  ALBUMIN 3.7   Recent Labs  Lab 11/24/21 0644  LIPASE 30   No results for input(s): "AMMONIA" in the last 168 hours. Coagulation Profile: No results for input(s): "INR", "PROTIME" in the last 168 hours. Cardiac Enzymes: No results for input(s): "CKTOTAL", "CKMB", "CKMBINDEX", "TROPONINI" in the last 168 hours. BNP (last 3 results) No results for input(s): "PROBNP" in the last 8760 hours. HbA1C: No results for input(s): "HGBA1C" in the last 72 hours. CBG: No results for input(s): "GLUCAP" in the last 168 hours. Lipid Profile: No results for input(s): "CHOL", "HDL", "LDLCALC", "TRIG", "CHOLHDL", "LDLDIRECT" in the last 72 hours. Thyroid Function Tests: No results for input(s): "TSH", "T4TOTAL", "FREET4", "T3FREE", "THYROIDAB" in the last 72 hours. Anemia Panel: No results for input(s): "VITAMINB12", "FOLATE", "FERRITIN", "TIBC", "IRON", "RETICCTPCT" in the last 72 hours. Urine analysis:    Component Value Date/Time   COLORURINE YELLOW 08/28/2020 0300   APPEARANCEUR Clear 06/07/2021 1049   LABSPEC 1.020 08/28/2020 0300   PHURINE 5.0 08/28/2020 0300   GLUCOSEU Negative 06/07/2021 1049   HGBUR NEGATIVE 08/28/2020 0300   BILIRUBINUR Negative 06/07/2021 1049   KETONESUR NEGATIVE 08/28/2020 0300   PROTEINUR Negative 06/07/2021 1049   PROTEINUR 30 (A) 08/28/2020 0300   UROBILINOGEN 0.2 11/26/2019 1103   NITRITE Negative 06/07/2021 1049   NITRITE NEGATIVE 08/28/2020 0300   LEUKOCYTESUR 2+ (A) 06/07/2021 1049   LEUKOCYTESUR MODERATE (A) 08/28/2020 0300    Radiological Exams on Admission: No results found.   Assessment/Plan Principal Problem:   AKI (acute kidney injury) ([place]) Active Problems:   Nephrolithiasis   GERD (gastroesophageal reflux disease)    Obesity (BMI 30-39.9)   COPD (chronic obstructive pulmonary disease) (HCC)   Diabetes ([place])    AKI on CKD stage IIIb -Baseline creatinine  near 1.5 and follows with Dr. Wolfgang Phoenix outpatient -Likely prerenal in the setting of poor oral intake with nausea vomiting and diarrhea -Check urine studies -Continue IV fluid -Avoid nephrotoxic agents -Strict I's and O's -Repeat a.m. labs  Intractable nausea and vomiting with diarrhea possibly secondary to viral gastroenteritis -Zofran and Imodium as needed for symptomatic improvement -Check GI panel -No recent antibiotic use noted  Mild hypokalemia -Likely related to above -Replete and reevaluate  Hypertension -Hold lisinopril given AKI -Hydralazine IV as needed for severe elevations  Dyslipidemia -Continue statin/Zetia/fish oil  COPD -DuoNebs as needed for shortness of breath or wheezing  Type 2 diabetes -SSI with carb modified diet  GERD -Continue H2 blocker/PPI  Obesity -BMI 30.82 -Lifestyle changes outpatient   DVT prophylaxis: Heparin Code Status: Full Family Communication: None at bedside Disposition Plan:Admit for AKI Consults called:None Admission status: Obs, Tele  Severity of Illness: The appropriate patient status for this patient is OBSERVATION. Observation status is judged to be reasonable and necessary in order to provide the required intensity of service to ensure the patient's safety. The patient's presenting symptoms, physical exam findings, and initial radiographic and laboratory data in the context of their medical condition is felt to place them at decreased risk for further clinical deterioration. Furthermore, it is anticipated that the patient will be medically stable for discharge from the hospital within 2 midnights of admission.    Latavion Halls D Sherryll Burger DO [facility]  If 7PM-7AM, please contact night-coverage [email or web address]  11/24/2021, 8:12 AM

## 2021-11-25 DIAGNOSIS — Z885 Allergy status to narcotic agent status: Secondary | ICD-10-CM | POA: Diagnosis not present

## 2021-11-25 DIAGNOSIS — Z87891 Personal history of nicotine dependence: Secondary | ICD-10-CM | POA: Diagnosis not present

## 2021-11-25 DIAGNOSIS — N1832 Chronic kidney disease, stage 3b: Secondary | ICD-10-CM | POA: Diagnosis present

## 2021-11-25 DIAGNOSIS — M109 Gout, unspecified: Secondary | ICD-10-CM | POA: Diagnosis present

## 2021-11-25 DIAGNOSIS — E876 Hypokalemia: Secondary | ICD-10-CM | POA: Diagnosis present

## 2021-11-25 DIAGNOSIS — Z79899 Other long term (current) drug therapy: Secondary | ICD-10-CM | POA: Diagnosis not present

## 2021-11-25 DIAGNOSIS — J449 Chronic obstructive pulmonary disease, unspecified: Secondary | ICD-10-CM | POA: Diagnosis present

## 2021-11-25 DIAGNOSIS — K219 Gastro-esophageal reflux disease without esophagitis: Secondary | ICD-10-CM | POA: Diagnosis present

## 2021-11-25 DIAGNOSIS — Z886 Allergy status to analgesic agent status: Secondary | ICD-10-CM | POA: Diagnosis not present

## 2021-11-25 DIAGNOSIS — Z6830 Body mass index (BMI) 30.0-30.9, adult: Secondary | ICD-10-CM | POA: Diagnosis not present

## 2021-11-25 DIAGNOSIS — Z87442 Personal history of urinary calculi: Secondary | ICD-10-CM | POA: Diagnosis not present

## 2021-11-25 DIAGNOSIS — H409 Unspecified glaucoma: Secondary | ICD-10-CM | POA: Diagnosis present

## 2021-11-25 DIAGNOSIS — A084 Viral intestinal infection, unspecified: Secondary | ICD-10-CM | POA: Diagnosis present

## 2021-11-25 DIAGNOSIS — N179 Acute kidney failure, unspecified: Secondary | ICD-10-CM | POA: Diagnosis present

## 2021-11-25 DIAGNOSIS — M1990 Unspecified osteoarthritis, unspecified site: Secondary | ICD-10-CM | POA: Diagnosis present

## 2021-11-25 DIAGNOSIS — E785 Hyperlipidemia, unspecified: Secondary | ICD-10-CM | POA: Diagnosis present

## 2021-11-25 DIAGNOSIS — E1122 Type 2 diabetes mellitus with diabetic chronic kidney disease: Secondary | ICD-10-CM | POA: Diagnosis present

## 2021-11-25 DIAGNOSIS — Z7984 Long term (current) use of oral hypoglycemic drugs: Secondary | ICD-10-CM | POA: Diagnosis not present

## 2021-11-25 DIAGNOSIS — I129 Hypertensive chronic kidney disease with stage 1 through stage 4 chronic kidney disease, or unspecified chronic kidney disease: Secondary | ICD-10-CM | POA: Diagnosis present

## 2021-11-25 DIAGNOSIS — Z96643 Presence of artificial hip joint, bilateral: Secondary | ICD-10-CM | POA: Diagnosis present

## 2021-11-25 DIAGNOSIS — E669 Obesity, unspecified: Secondary | ICD-10-CM | POA: Diagnosis present

## 2021-11-25 DIAGNOSIS — Z888 Allergy status to other drugs, medicaments and biological substances status: Secondary | ICD-10-CM | POA: Diagnosis not present

## 2021-11-25 DIAGNOSIS — Z96651 Presence of right artificial knee joint: Secondary | ICD-10-CM | POA: Diagnosis present

## 2021-11-25 LAB — BASIC METABOLIC PANEL
Anion gap: 7 (ref 5–15)
BUN: 78 mg/dL — ABNORMAL HIGH (ref 8–23)
CO2: 17 mmol/L — ABNORMAL LOW (ref 22–32)
Calcium: 9 mg/dL (ref 8.9–10.3)
Chloride: 118 mmol/L — ABNORMAL HIGH (ref 98–111)
Creatinine, Ser: 2.37 mg/dL — ABNORMAL HIGH (ref 0.44–1.00)
GFR, Estimated: 22 mL/min — ABNORMAL LOW (ref >60)
Glucose, Bld: 137 mg/dL — ABNORMAL HIGH (ref 70–99)
Potassium: 3.4 mmol/L — ABNORMAL LOW (ref 3.5–5.1)
Sodium: 142 mmol/L (ref 135–145)

## 2021-11-25 LAB — CBC
HCT: 36.7 % (ref 36.0–46.0)
Hemoglobin: 11.3 g/dL — ABNORMAL LOW (ref 12.0–15.0)
MCH: 29.9 pg (ref 26.0–34.0)
MCHC: 30.8 g/dL (ref 30.0–36.0)
MCV: 97.1 fL (ref 80.0–100.0)
Platelets: 238 10*3/uL (ref 150–400)
RBC: 3.78 MIL/uL — ABNORMAL LOW (ref 3.87–5.11)
RDW: 14.5 % (ref 11.5–15.5)
WBC: 10.7 10*3/uL — ABNORMAL HIGH (ref 4.0–10.5)
nRBC: 0 % (ref 0.0–0.2)

## 2021-11-25 LAB — GLUCOSE, CAPILLARY
Glucose-Capillary: 131 mg/dL — ABNORMAL HIGH (ref 70–99)
Glucose-Capillary: 118 mg/dL — ABNORMAL HIGH (ref 70–99)
Glucose-Capillary: 128 mg/dL — ABNORMAL HIGH (ref 70–99)
Glucose-Capillary: 107 mg/dL — ABNORMAL HIGH (ref 70–99)

## 2021-11-25 LAB — MAGNESIUM: Magnesium: 2 mg/dL (ref 1.7–2.4)

## 2021-11-25 MED ORDER — LACTATED RINGERS IV SOLN
INTRAVENOUS | Status: AC
Start: 2021-11-25 — End: 2021-11-26

## 2021-11-25 MED ORDER — POTASSIUM CHLORIDE CRYS ER 20 MEQ PO TBCR
40.0000 meq | EXTENDED_RELEASE_TABLET | Freq: Two times a day (BID) | ORAL | Status: AC
  Administered 2021-11-25 (×2): 40 meq via ORAL
  Filled 2021-11-25 (×2): qty 2

## 2021-11-25 NOTE — Progress Notes (Signed)
PROGRESS NOTE    ODENA MCQUAID  IOE:[id number] DOB: 05/05/1951 DOA: 11/24/2021 PCP: Assunta Found, MD   Brief Narrative:    Gwendolyn Snyder is a 71 y.o. female with medical history significant for obesity, COPD, hypertension, type 2 diabetes, gout, dyslipidemia, GERD and CKD stage IIIb who presented to the ED with complaints of nausea and vomiting as well as diarrhea that began yesterday evening.  She was admitted with AKI on CKD stage IIIb along with possible viral gastroenteritis and mild hypokalemia.  Assessment & Plan:   Principal Problem:   AKI (acute kidney injury) (HCC) Active Problems:   Nephrolithiasis   GERD (gastroesophageal reflux disease)   Obesity (BMI 30-39.9)   COPD (chronic obstructive pulmonary disease) (HCC)   Diabetes (HCC)  Assessment and Plan:  AKI on CKD stage IIIb-slowly improving -Baseline creatinine near 1.5 and follows with Dr. Wolfgang Phoenix outpatient -Likely prerenal in the setting of poor oral intake with nausea vomiting and diarrhea -Check urine studies -Continue IV fluid -Avoid nephrotoxic agents -Strict I's and O's -Repeat a.m. labs   Intractable nausea and vomiting with diarrhea possibly secondary to viral gastroenteritis -Zofran and Imodium as needed for symptomatic improvement -Check GI panel-pending -No recent antibiotic use noted   Mild hypokalemia -Likely related to above -Replete and reevaluate   Hypertension -Hold lisinopril given AKI -Hydralazine IV as needed for severe elevations   Dyslipidemia -Continue statin/Zetia/fish oil   COPD -DuoNebs as needed for shortness of breath or wheezing   Type 2 diabetes -SSI with carb modified diet   GERD -Continue H2 blocker/PPI   Obesity -BMI 30.82 -Lifestyle changes outpatient     DVT prophylaxis: Heparin Code Status: Full Family Communication: None at bedside Disposition Plan:  Status is: Inpatient Remains inpatient appropriate because: Need for ongoing IV  fluid   Consultants:  None  Procedures:  None  Antimicrobials:  None   Subjective: Patient seen and evaluated today with no new acute complaints or concerns. No acute concerns or events noted overnight.  She is less nauseous this morning and would like to try a diet.  Objective: Vitals:   11/24/21 1355 11/24/21 2032 11/25/21 0451 11/25/21 0953  BP: (!) 133/58 126/62 128/60 (!) 121/56  Pulse: 89 87 89 86  Resp: 18 18 20    Temp: 98.6 F (37 C) 98.6 F (37 C) 98.8 F (37.1 C)   TempSrc:  Oral Oral   SpO2: 93% 92% 94%   Weight:      Height:        Intake/Output Summary (Last 24 hours) at 11/25/2021 1006 Last data filed at 11/25/2021 0501 Gross per 24 hour  Intake 1000 ml  Output 550 ml  Net 450 ml   Filed Weights   11/24/21 0620 11/24/21 0945  Weight: 78.9 kg 80.6 kg    Examination:  General exam: Appears calm and comfortable  Respiratory system: Clear to auscultation. Respiratory effort normal. Cardiovascular system: S1 & S2 heard, RRR.  Gastrointestinal system: Abdomen is soft Central nervous system: Alert and awake Extremities: No edema Skin: No significant lesions noted Psychiatry: Flat affect.    Data Reviewed: I have personally reviewed following labs and imaging studies  CBC: Recent Labs  Lab 11/24/21 0644 11/24/21 0832 11/25/21 0429  WBC 13.5* 11.4* 10.7*  HGB 12.4 11.8* 11.3*  HCT 39.3 37.6 36.7  MCV 95.6 96.9 97.1  PLT 291 224 238   Basic Metabolic Panel: Recent Labs  Lab 11/24/21 0644 11/24/21 0832 11/25/21 0429  NA 140  --  142  K 3.4*  --  3.4*  CL 113*  --  118*  CO2 16*  --  17*  GLUCOSE 185*  --  137*  BUN 89*  --  78*  CREATININE 3.28* 2.89* 2.37*  CALCIUM 9.9  --  9.0  MG  --   --  2.0   GFR: Estimated Creatinine Clearance: 22.2 mL/min (A) (by C-G formula based on SCr of 2.37 mg/dL (H)). Liver Function Tests: Recent Labs  Lab 11/24/21 0644  AST 9*  ALT 13  ALKPHOS 72  BILITOT 0.4  PROT 7.4  ALBUMIN 3.7    Recent Labs  Lab 11/24/21 0644  LIPASE 30   No results for input(s): "AMMONIA" in the last 168 hours. Coagulation Profile: No results for input(s): "INR", "PROTIME" in the last 168 hours. Cardiac Enzymes: No results for input(s): "CKTOTAL", "CKMB", "CKMBINDEX", "TROPONINI" in the last 168 hours. BNP (last 3 results) No results for input(s): "PROBNP" in the last 8760 hours. HbA1C: Recent Labs    11/24/21 0832  HGBA1C 6.2*   CBG: Recent Labs  Lab 11/24/21 1125 11/24/21 1610 11/24/21 2112 11/25/21 0758  GLUCAP 144* 154* 108* 131*   Lipid Profile: No results for input(s): "CHOL", "HDL", "LDLCALC", "TRIG", "CHOLHDL", "LDLDIRECT" in the last 72 hours. Thyroid Function Tests: No results for input(s): "TSH", "T4TOTAL", "FREET4", "T3FREE", "THYROIDAB" in the last 72 hours. Anemia Panel: No results for input(s): "VITAMINB12", "FOLATE", "FERRITIN", "TIBC", "IRON", "RETICCTPCT" in the last 72 hours. Sepsis Labs: No results for input(s): "PROCALCITON", "LATICACIDVEN" in the last 168 hours.  No results found for this or any previous visit (from the past 240 hour(s)).       Radiology Studies: DG Abd 1 View  Result Date: 11/24/2021 CLINICAL DATA:  Mild Ng today and diarrhea. EXAM: ABDOMEN - 1 VIEW COMPARISON:  Radiograph 06/05/2021, CT 09/21/2020 FINDINGS: There is mild gaseous distention of colon which appears featureless and can be seen in the setting of colitis. No definite small bowel dilatation or evidence of obstruction. Multiple bilateral intrarenal calculi. No definite ureteral or bladder stones. There is a large hiatal hernia, similar to prior CT. The intrathoracic stomach is distended with air. IMPRESSION: 1. Mild gaseous distention of the colon which appears featureless and can be seen in the setting of colitis. No definite small bowel dilatation or evidence of obstruction. 2. Large hiatal hernia with gaseous distention of intrathoracic stomach. 3. Multiple bilateral  intrarenal calculi. Electronically Signed   By: Narda Rutherford M.D.   On: 11/24/2021 16:35        Scheduled Meds:  allopurinol  300 mg Oral QPM   amLODipine  10 mg Oral Daily   carvedilol  6.25 mg Oral BID   ezetimibe  10 mg Oral Daily   heparin  5,000 Units Subcutaneous Q8H   insulin aspart  0-5 Units Subcutaneous QHS   insulin aspart  0-9 Units Subcutaneous TID WC   latanoprost  1 drop Both Eyes QHS   linagliptin  5 mg Oral Daily   omega-3 acid ethyl esters  1 g Oral BID   pantoprazole  40 mg Oral Daily   potassium chloride  40 mEq Oral BID   rosuvastatin  5 mg Oral QHS   Continuous Infusions:  lactated ringers       LOS: 0 days    Time spent: 35 minutes    Phillippa Straub Hoover Brunette, DO [facility]  If 7PM-7AM, please contact night-coverage [email or web address] 11/25/2021, 10:06 AM

## 2021-11-26 DIAGNOSIS — N179 Acute kidney failure, unspecified: Secondary | ICD-10-CM | POA: Diagnosis not present

## 2021-11-26 LAB — CBC
HCT: 32.7 % — ABNORMAL LOW (ref 36.0–46.0)
Hemoglobin: 10.2 g/dL — ABNORMAL LOW (ref 12.0–15.0)
MCH: 30.4 pg (ref 26.0–34.0)
MCHC: 31.2 g/dL (ref 30.0–36.0)
MCV: 97.3 fL (ref 80.0–100.0)
Platelets: 167 10*3/uL (ref 150–400)
RBC: 3.36 MIL/uL — ABNORMAL LOW (ref 3.87–5.11)
RDW: 14.5 % (ref 11.5–15.5)
WBC: 13.1 10*3/uL — ABNORMAL HIGH (ref 4.0–10.5)
nRBC: 0 % (ref 0.0–0.2)

## 2021-11-26 LAB — GASTROINTESTINAL PANEL BY PCR, STOOL (REPLACES STOOL CULTURE)

## 2021-11-26 LAB — MAGNESIUM: Magnesium: 1.9 mg/dL (ref 1.7–2.4)

## 2021-11-26 LAB — BASIC METABOLIC PANEL
Anion gap: 6 (ref 5–15)
BUN: 65 mg/dL — ABNORMAL HIGH (ref 8–23)
CO2: 17 mmol/L — ABNORMAL LOW (ref 22–32)
Calcium: 8.8 mg/dL — ABNORMAL LOW (ref 8.9–10.3)
Chloride: 118 mmol/L — ABNORMAL HIGH (ref 98–111)
Creatinine, Ser: 1.93 mg/dL — ABNORMAL HIGH (ref 0.44–1.00)
GFR, Estimated: 28 mL/min — ABNORMAL LOW (ref >60)
Glucose, Bld: 108 mg/dL — ABNORMAL HIGH (ref 70–99)
Potassium: 3.9 mmol/L (ref 3.5–5.1)
Sodium: 141 mmol/L (ref 135–145)

## 2021-11-26 LAB — GLUCOSE, CAPILLARY
Glucose-Capillary: 110 mg/dL — ABNORMAL HIGH (ref 70–99)
Glucose-Capillary: 118 mg/dL — ABNORMAL HIGH (ref 70–99)

## 2021-11-26 MED ORDER — ONDANSETRON HCL 4 MG PO TABS: 4.0000 mg | ORAL_TABLET | Freq: Four times a day (QID) | ORAL | 0 refills | Status: DC | PRN

## 2021-11-26 NOTE — Discharge Summary (Signed)
Physician Discharge Summary  Gwendolyn Snyder GMW:[id number] DOB: 04/27/1951 DOA: 11/24/2021  PCP: Assunta Found, MD  Admit date: 11/24/2021  Discharge date: 11/26/2021  Admitted From:Home  Disposition:  Home  Recommendations for Outpatient Follow-up:  Follow up with PCP in 1-2 weeks Follow-up with nephrology as scheduled and repeat BMP in 3-5 days Hold lisinopril for now until further lab work has resulted Zofran prescribed as needed for any further nausea or vomiting issues Continue other home medications as previous  Home Health: None  Equipment/Devices: None  Discharge Condition:Stable  CODE STATUS: Full  Diet recommendation: Heart Healthy/carb modified  Brief/Interim Summary: Gwendolyn Snyder is a 71 y.o. female with medical history significant for obesity, COPD, hypertension, type 2 diabetes, gout, dyslipidemia, GERD and CKD stage IIIb who presented to the ED with complaints of nausea and vomiting as well as diarrhea that began yesterday evening.  She was admitted with AKI on CKD stage IIIb along with possible viral gastroenteritis and mild hypokalemia.  Her labs show continual improvement in her kidney function with creatinine down to 1.9.  This should continue to improve further in the outpatient setting as she maintains her oral intake.  She has been instructed to remain off of her lisinopril for now until further lab work confirms improvement back to baseline.  She will be given Zofran as needed for any nausea or vomiting.  No other acute events or concerns noted throughout the course of this hospitalization.  Discharge Diagnoses:  Principal Problem:   AKI (acute kidney injury) (HCC) Active Problems:   Nephrolithiasis   GERD (gastroesophageal reflux disease)   Obesity (BMI 30-39.9)   COPD (chronic obstructive pulmonary disease) (HCC)   Diabetes (HCC)  Principal discharge diagnosis: AKI on CKD stage IIIb-prerenal in the setting of possible viral gastroenteritis with  intractable nausea and vomiting.  Mild hypokalemia with diarrhea-resolved.  Discharge Instructions  Discharge Instructions     Diet - low sodium heart healthy   Complete by: As directed    Increase activity slowly   Complete by: As directed       Allergies as of 11/26/2021       Reactions   Aspirin Other (See Comments)   Makes ears ring and heart beat fast   Codeine Other (See Comments)   Hard to wake up   Flomax [tamsulosin Hcl] Nausea And Vomiting   Pravastatin    Myalgias         Medication List     STOP taking these medications    lisinopril 20 MG tablet Commonly known as: ZESTRIL       TAKE these medications    acetaminophen 650 MG CR tablet Commonly known as: TYLENOL Take 1,300 mg by mouth 2 (two) times daily.   allopurinol 300 MG tablet Commonly known as: ZYLOPRIM Take 1 tablet (300 mg total) by mouth every evening.   amLODipine 10 MG tablet Commonly known as: NORVASC Take 10 mg by mouth daily.   amLODipine 10 MG tablet Commonly known as: NORVASC Take 1 tablet by mouth daily.   carvedilol 3.125 MG tablet Commonly known as: COREG Take 6.25 mg by mouth 2 (two) times daily.   cholecalciferol 1000 units tablet Commonly known as: VITAMIN D Take 1,000 Units by mouth daily.   Co Q 10 100 MG Caps Take 100 mg by mouth daily.   cyanocobalamin 1000 MCG/ML injection Commonly known as: (VITAMIN B-12) Inject 1,000 mcg into the skin every 30 (thirty) days.   ezetimibe 10 MG tablet Commonly  known as: ZETIA Take 10 mg by mouth daily.   Farxiga 5 MG Tabs tablet Generic drug: dapagliflozin propanediol Take 5 mg by mouth daily.   FREESTYLE LITE test strip Generic drug: glucose blood Use to test blood sugar once daily   Inositol Niacinate 500 MG Caps Take 500 mg by mouth 2 (two) times daily.   latanoprost 0.005 % ophthalmic solution Commonly known as: XALATAN Apply to eye.   latanoprost 0.005 % ophthalmic solution Commonly known as:  XALATAN Place 1 drop into both eyes at bedtime.   niacin 500 MG tablet Take by mouth.   omega-3 acid ethyl esters 1 g capsule Commonly known as: LOVAZA Take 1 g by mouth 2 (two) times daily.   ondansetron 4 MG tablet Commonly known as: ZOFRAN Take 1 tablet (4 mg total) by mouth every 6 (six) hours as needed for nausea.   pantoprazole 40 MG tablet Commonly known as: PROTONIX Take by mouth.   pantoprazole 40 MG tablet Commonly known as: PROTONIX TAKE ONE TABLET BY MOUTH ONCE DAILY   Potassium Citrate 15 MEQ (1620 MG) Tbcr Take 2 tabs qam and 1 tab qpm po What changed:  how much to take how to take this when to take this additional instructions   rosuvastatin 5 MG tablet Commonly known as: CRESTOR Take 5 mg by mouth at bedtime.   sitaGLIPtin 100 MG tablet Commonly known as: JANUVIA Take 1 tablet (100 mg total) by mouth daily.        Follow-up Information     Sharilyn Sites, MD. Schedule an appointment as soon as possible for a visit in 1 week(s).   Specialty: Family Medicine Contact information: [address] [place] [id number] [phone]                Allergies  Allergen Reactions   Aspirin Other (See Comments)    Makes ears ring and heart beat fast   Codeine Other (See Comments)    Hard to wake up   Flomax [Tamsulosin Hcl] Nausea And Vomiting   Pravastatin     Myalgias     Consultations: None   Procedures/Studies: DG Abd 1 View  Result Date: 11/24/2021 CLINICAL DATA:  Mild Ng today and diarrhea. EXAM: ABDOMEN - 1 VIEW COMPARISON:  Radiograph 06/05/2021, CT 09/21/2020 FINDINGS: There is mild gaseous distention of colon which appears featureless and can be seen in the setting of colitis. No definite small bowel dilatation or evidence of obstruction. Multiple bilateral intrarenal calculi. No definite ureteral or bladder stones. There is a large hiatal hernia, similar to prior CT. The intrathoracic stomach is distended with air.  IMPRESSION: 1. Mild gaseous distention of the colon which appears featureless and can be seen in the setting of colitis. No definite small bowel dilatation or evidence of obstruction. 2. Large hiatal hernia with gaseous distention of intrathoracic stomach. 3. Multiple bilateral intrarenal calculi. Electronically Signed   By: Keith Rake M.D.   On: 11/24/2021 16:35   US RENAL  Result Date: 11/07/2021 CLINICAL DATA:  Acute renal insufficiency. Chronic renal disease. History of renal stones. EXAM: RENAL / URINARY TRACT ULTRASOUND COMPLETE COMPARISON:  CT scan of the abdomen and pelvis Sep 21, 2020. Renal ultrasound June 20, 2020. FINDINGS: Right Kidney: Renal measurements: 9.7 x 5.2 x 4.1 cm = volume: 107.1 mL. A 15 mm nonobstructive stone is identified in the right kidney. There are likely other scattered stones. A 1.8 cm cyst is identified. No follow-up imaging recommended for the cyst. Subjectively,  there is cortical thinning. Left Kidney: Renal measurements: 11.8 x 5.9 x 5.0 cm = volume: 184 mL. A 2.1 cm stone is identified in the left kidney without obstruction. There appear to be other scattered smaller stones. Subjectively, there is cortical thinning. Bladder: Appears normal for degree of bladder distention. Other: None. IMPRESSION: 1. Bilateral nonobstructive stones. The largest stone on the right is 15 mm and the largest stone on the left is 21 mm. 2. Subjectively, there is cortical thinning bilaterally and symmetrically. 3. No other abnormalities are identified. Electronically Signed   By: Gerome Sam III M.D.   On: 11/07/2021 08:28     Discharge Exam: Vitals:   11/25/21 2010 11/26/21 0454  BP: (!) 128/54 122/60  Pulse: 84 89  Resp: 16 16  Temp: 98 F (36.7 C) 98.5 F (36.9 C)  SpO2: 94% 95%   Vitals:   11/25/21 0953 11/25/21 1433 11/25/21 2010 11/26/21 0454  BP: (!) 121/56 128/64 (!) 128/54 122/60  Pulse: 86 87 84 89  Resp:  18 16 16   Temp:  98.3 F (36.8 C) 98 F (36.7  C) 98.5 F (36.9 C)  TempSrc:  Oral  Oral  SpO2:  94% 94% 95%  Weight:      Height:        General: Pt is alert, awake, not in acute distress Cardiovascular: RRR, S1/S2 +, no rubs, no gallops Respiratory: CTA bilaterally, no wheezing, no rhonchi Abdominal: Soft, NT, ND, bowel sounds + Extremities: no edema, no cyanosis    The results of significant diagnostics from this hospitalization (including imaging, microbiology, ancillary and laboratory) are listed below for reference.     Microbiology: No results found for this or any previous visit (from the past 240 hour(s)).   Labs: BNP (last 3 results) No results for input(s): "BNP" in the last 8760 hours. Basic Metabolic Panel: Recent Labs  Lab 11/24/21 0644 11/24/21 0832 11/25/21 0429 11/26/21 0443  NA 140  --  142 141  K 3.4*  --  3.4* 3.9  CL 113*  --  118* 118*  CO2 16*  --  17* 17*  GLUCOSE 185*  --  137* 108*  BUN 89*  --  78* 65*  CREATININE 3.28* 2.89* 2.37* 1.93*  CALCIUM 9.9  --  9.0 8.8*  MG  --   --  2.0 1.9   Liver Function Tests: Recent Labs  Lab 11/24/21 0644  AST 9*  ALT 13  ALKPHOS 72  BILITOT 0.4  PROT 7.4  ALBUMIN 3.7   Recent Labs  Lab 11/24/21 0644  LIPASE 30   No results for input(s): "AMMONIA" in the last 168 hours. CBC: Recent Labs  Lab 11/24/21 0644 11/24/21 0832 11/25/21 0429 11/26/21 0621  WBC 13.5* 11.4* 10.7* 13.1*  HGB 12.4 11.8* 11.3* 10.2*  HCT 39.3 37.6 36.7 32.7*  MCV 95.6 96.9 97.1 97.3  PLT 291 224 238 167   Cardiac Enzymes: No results for input(s): "CKTOTAL", "CKMB", "CKMBINDEX", "TROPONINI" in the last 168 hours. BNP: Invalid input(s): "POCBNP" CBG: Recent Labs  Lab 11/25/21 0758 11/25/21 1200 11/25/21 1634 11/25/21 2049 11/26/21 0726  GLUCAP 131* 118* 128* 107* 110*   D-Dimer No results for input(s): "DDIMER" in the last 72 hours. Hgb A1c Recent Labs    11/24/21 0832  HGBA1C 6.2*   Lipid Profile No results for input(s): "CHOL", "HDL",  "LDLCALC", "TRIG", "CHOLHDL", "LDLDIRECT" in the last 72 hours. Thyroid function studies No results for input(s): "TSH", "T4TOTAL", "T3FREE", "THYROIDAB" in the  last 72 hours.  Invalid input(s): "FREET3" Anemia work up No results for input(s): "VITAMINB12", "FOLATE", "FERRITIN", "TIBC", "IRON", "RETICCTPCT" in the last 72 hours. Urinalysis    Component Value Date/Time   COLORURINE YELLOW 11/24/2021 1400   APPEARANCEUR CLEAR 11/24/2021 1400   APPEARANCEUR Clear 06/07/2021 1049   LABSPEC 1.016 11/24/2021 1400   PHURINE 5.0 11/24/2021 1400   GLUCOSEU NEGATIVE 11/24/2021 1400   HGBUR NEGATIVE 11/24/2021 1400   BILIRUBINUR NEGATIVE 11/24/2021 1400   BILIRUBINUR Negative 06/07/2021 1049   KETONESUR NEGATIVE 11/24/2021 1400   PROTEINUR NEGATIVE 11/24/2021 1400   UROBILINOGEN 0.2 11/26/2019 1103   NITRITE NEGATIVE 11/24/2021 1400   LEUKOCYTESUR MODERATE (A) 11/24/2021 1400   Sepsis Labs Recent Labs  Lab 11/24/21 0644 11/24/21 0832 11/25/21 0429 11/26/21 0621  WBC 13.5* 11.4* 10.7* 13.1*   Microbiology No results found for this or any previous visit (from the past 240 hour(s)).   Time coordinating discharge: 35 minutes  SIGNED:   Rodena Goldmann, DO [facility] 11/26/2021, 9:38 AM  If 7PM-7AM, please contact night-coverage [email or web address]

## 2021-11-26 NOTE — Progress Notes (Signed)
  Transition of Care [facility]) Screening Note   Patient Details  Name: Gwendolyn Snyder Date of Birth: [birth year]   Transition [facility]) CM/SW Contact:    Villa Herb, LCSWA Phone Number: 11/26/2021, 10:13 AM    Transition of Care Department [facility]) has reviewed patient and no TOC needs have been identified at this time. We will continue to monitor patient advancement through interdisciplinary progression rounds. If new patient transition needs arise, please place a TOC consult.

## 2021-11-26 NOTE — Progress Notes (Signed)
Nsg Discharge Note  Admit Date:  11/24/2021 Discharge date: 11/26/2021   Paralee Cancel to be D/C'd Home per MD order.  AVS completed.  Copy for chart, and copy for patient signed, and dated. Patient/caregiver able to verbalize understanding. IV removed and discharge paper work given and reviewed with patient   Discharge Medication: Allergies as of 11/26/2021       Reactions   Aspirin Other (See Comments)   Makes ears ring and heart beat fast   Codeine Other (See Comments)   Hard to wake up   Flomax [tamsulosin Hcl] Nausea And Vomiting   Pravastatin    Myalgias         Medication List     STOP taking these medications    lisinopril 20 MG tablet Commonly known as: ZESTRIL       TAKE these medications    acetaminophen 650 MG CR tablet Commonly known as: TYLENOL Take 1,300 mg by mouth 2 (two) times daily.   allopurinol 300 MG tablet Commonly known as: ZYLOPRIM Take 1 tablet (300 mg total) by mouth every evening.   amLODipine 10 MG tablet Commonly known as: NORVASC Take 10 mg by mouth daily.   amLODipine 10 MG tablet Commonly known as: NORVASC Take 1 tablet by mouth daily.   carvedilol 3.125 MG tablet Commonly known as: COREG Take 6.25 mg by mouth 2 (two) times daily.   cholecalciferol 1000 units tablet Commonly known as: VITAMIN D Take 1,000 Units by mouth daily.   Co Q 10 100 MG Caps Take 100 mg by mouth daily.   cyanocobalamin 1000 MCG/ML injection Commonly known as: (VITAMIN B-12) Inject 1,000 mcg into the skin every 30 (thirty) days.   ezetimibe 10 MG tablet Commonly known as: ZETIA Take 10 mg by mouth daily.   Farxiga 5 MG Tabs tablet Generic drug: dapagliflozin propanediol Take 5 mg by mouth daily.   FREESTYLE LITE test strip Generic drug: glucose blood Use to test blood sugar once daily   Inositol Niacinate 500 MG Caps Take 500 mg by mouth 2 (two) times daily.   latanoprost 0.005 % ophthalmic solution Commonly known as:  XALATAN Apply to eye.   latanoprost 0.005 % ophthalmic solution Commonly known as: XALATAN Place 1 drop into both eyes at bedtime.   niacin 500 MG tablet Take by mouth.   omega-3 acid ethyl esters 1 g capsule Commonly known as: LOVAZA Take 1 g by mouth 2 (two) times daily.   ondansetron 4 MG tablet Commonly known as: ZOFRAN Take 1 tablet (4 mg total) by mouth every 6 (six) hours as needed for nausea.   pantoprazole 40 MG tablet Commonly known as: PROTONIX Take by mouth.   pantoprazole 40 MG tablet Commonly known as: PROTONIX TAKE ONE TABLET BY MOUTH ONCE DAILY   Potassium Citrate 15 MEQ (1620 MG) Tbcr Take 2 tabs qam and 1 tab qpm po What changed:  how much to take how to take this when to take this additional instructions   rosuvastatin 5 MG tablet Commonly known as: CRESTOR Take 5 mg by mouth at bedtime.   sitaGLIPtin 100 MG tablet Commonly known as: JANUVIA Take 1 tablet (100 mg total) by mouth daily.        Discharge Assessment: Vitals:   11/26/21 0454 11/26/21 0946  BP: 122/60 131/63  Pulse: 89 86  Resp: 16   Temp: 98.5 F (36.9 C)   SpO2: 95%    Skin clean, dry and intact without evidence of skin  break down, no evidence of skin tears noted. IV catheter discontinued intact. Site without signs and symptoms of complications - no redness or edema noted at insertion site, patient denies c/o pain - only slight tenderness at site.  Dressing with slight pressure applied.  D/c Instructions-Education: Discharge instructions given to patient/family with verbalized understanding. D/c education completed with patient/family including follow up instructions, medication list, d/c activities limitations if indicated, with other d/c instructions as indicated by MD - patient able to verbalize understanding, all questions fully answered. Patient instructed to return to ED, call 911, or call MD for any changes in condition.  Patient escorted via WC, and D/C home via  private auto.  Lonn Georgia, RN 11/26/2021 12:14 PM

## 2021-11-29 DIAGNOSIS — I129 Hypertensive chronic kidney disease with stage 1 through stage 4 chronic kidney disease, or unspecified chronic kidney disease: Secondary | ICD-10-CM | POA: Diagnosis not present

## 2021-11-29 DIAGNOSIS — E1122 Type 2 diabetes mellitus with diabetic chronic kidney disease: Secondary | ICD-10-CM | POA: Diagnosis not present

## 2021-11-29 DIAGNOSIS — E860 Dehydration: Secondary | ICD-10-CM | POA: Diagnosis not present

## 2021-11-29 DIAGNOSIS — D649 Anemia, unspecified: Secondary | ICD-10-CM | POA: Diagnosis not present

## 2021-11-29 DIAGNOSIS — N170 Acute kidney failure with tubular necrosis: Secondary | ICD-10-CM | POA: Diagnosis not present

## 2021-11-29 DIAGNOSIS — N189 Chronic kidney disease, unspecified: Secondary | ICD-10-CM | POA: Diagnosis not present

## 2021-11-29 DIAGNOSIS — Z6830 Body mass index (BMI) 30.0-30.9, adult: Secondary | ICD-10-CM | POA: Diagnosis not present

## 2021-11-29 DIAGNOSIS — E6609 Other obesity due to excess calories: Secondary | ICD-10-CM | POA: Diagnosis not present

## 2021-11-29 DIAGNOSIS — E1129 Type 2 diabetes mellitus with other diabetic kidney complication: Secondary | ICD-10-CM | POA: Diagnosis not present

## 2021-11-29 DIAGNOSIS — N179 Acute kidney failure, unspecified: Secondary | ICD-10-CM | POA: Diagnosis not present

## 2021-11-29 DIAGNOSIS — N200 Calculus of kidney: Secondary | ICD-10-CM | POA: Diagnosis not present

## 2021-12-11 DIAGNOSIS — E538 Deficiency of other specified B group vitamins: Secondary | ICD-10-CM | POA: Diagnosis not present

## 2021-12-17 DIAGNOSIS — E119 Type 2 diabetes mellitus without complications: Secondary | ICD-10-CM | POA: Diagnosis not present

## 2021-12-17 DIAGNOSIS — H401111 Primary open-angle glaucoma, right eye, mild stage: Secondary | ICD-10-CM | POA: Diagnosis not present

## 2021-12-17 DIAGNOSIS — Z961 Presence of intraocular lens: Secondary | ICD-10-CM | POA: Diagnosis not present

## 2022-01-08 DIAGNOSIS — N200 Calculus of kidney: Secondary | ICD-10-CM | POA: Diagnosis not present

## 2022-01-08 DIAGNOSIS — E1122 Type 2 diabetes mellitus with diabetic chronic kidney disease: Secondary | ICD-10-CM | POA: Diagnosis not present

## 2022-01-08 DIAGNOSIS — N189 Chronic kidney disease, unspecified: Secondary | ICD-10-CM | POA: Diagnosis not present

## 2022-01-08 DIAGNOSIS — I129 Hypertensive chronic kidney disease with stage 1 through stage 4 chronic kidney disease, or unspecified chronic kidney disease: Secondary | ICD-10-CM | POA: Diagnosis not present

## 2022-01-08 DIAGNOSIS — N170 Acute kidney failure with tubular necrosis: Secondary | ICD-10-CM | POA: Diagnosis not present

## 2022-01-11 DIAGNOSIS — E538 Deficiency of other specified B group vitamins: Secondary | ICD-10-CM | POA: Diagnosis not present

## 2022-01-17 DIAGNOSIS — Z6829 Body mass index (BMI) 29.0-29.9, adult: Secondary | ICD-10-CM | POA: Diagnosis not present

## 2022-01-17 DIAGNOSIS — T461X5A Adverse effect of calcium-channel blockers, initial encounter: Secondary | ICD-10-CM | POA: Diagnosis not present

## 2022-01-21 DIAGNOSIS — N189 Chronic kidney disease, unspecified: Secondary | ICD-10-CM | POA: Diagnosis not present

## 2022-01-21 DIAGNOSIS — I129 Hypertensive chronic kidney disease with stage 1 through stage 4 chronic kidney disease, or unspecified chronic kidney disease: Secondary | ICD-10-CM | POA: Diagnosis not present

## 2022-01-21 DIAGNOSIS — Z6829 Body mass index (BMI) 29.0-29.9, adult: Secondary | ICD-10-CM | POA: Diagnosis not present

## 2022-01-21 DIAGNOSIS — N200 Calculus of kidney: Secondary | ICD-10-CM | POA: Diagnosis not present

## 2022-01-21 DIAGNOSIS — N170 Acute kidney failure with tubular necrosis: Secondary | ICD-10-CM | POA: Diagnosis not present

## 2022-01-21 DIAGNOSIS — H6123 Impacted cerumen, bilateral: Secondary | ICD-10-CM | POA: Diagnosis not present

## 2022-01-21 DIAGNOSIS — E1122 Type 2 diabetes mellitus with diabetic chronic kidney disease: Secondary | ICD-10-CM | POA: Diagnosis not present

## 2022-01-24 DIAGNOSIS — R809 Proteinuria, unspecified: Secondary | ICD-10-CM | POA: Diagnosis not present

## 2022-01-24 DIAGNOSIS — N200 Calculus of kidney: Secondary | ICD-10-CM | POA: Diagnosis not present

## 2022-01-24 DIAGNOSIS — E1129 Type 2 diabetes mellitus with other diabetic kidney complication: Secondary | ICD-10-CM | POA: Diagnosis not present

## 2022-01-24 DIAGNOSIS — I129 Hypertensive chronic kidney disease with stage 1 through stage 4 chronic kidney disease, or unspecified chronic kidney disease: Secondary | ICD-10-CM | POA: Diagnosis not present

## 2022-01-24 DIAGNOSIS — R82991 Hypocitraturia: Secondary | ICD-10-CM | POA: Diagnosis not present

## 2022-01-24 DIAGNOSIS — E1122 Type 2 diabetes mellitus with diabetic chronic kidney disease: Secondary | ICD-10-CM | POA: Diagnosis not present

## 2022-01-24 DIAGNOSIS — N189 Chronic kidney disease, unspecified: Secondary | ICD-10-CM | POA: Diagnosis not present

## 2022-01-24 DIAGNOSIS — Z6829 Body mass index (BMI) 29.0-29.9, adult: Secondary | ICD-10-CM | POA: Diagnosis not present

## 2022-01-24 DIAGNOSIS — E870 Hyperosmolality and hypernatremia: Secondary | ICD-10-CM | POA: Diagnosis not present

## 2022-01-28 ENCOUNTER — Encounter: Payer: Self-pay | Admitting: *Deleted

## 2022-02-11 DIAGNOSIS — Z23 Encounter for immunization: Secondary | ICD-10-CM | POA: Diagnosis not present

## 2022-02-11 DIAGNOSIS — E538 Deficiency of other specified B group vitamins: Secondary | ICD-10-CM | POA: Diagnosis not present

## 2022-02-15 ENCOUNTER — Other Ambulatory Visit (HOSPITAL_COMMUNITY): Payer: Self-pay | Admitting: Family Medicine

## 2022-02-15 DIAGNOSIS — Z1231 Encounter for screening mammogram for malignant neoplasm of breast: Secondary | ICD-10-CM

## 2022-03-07 ENCOUNTER — Encounter: Payer: Self-pay | Admitting: *Deleted

## 2022-03-07 ENCOUNTER — Telehealth: Payer: Self-pay | Admitting: *Deleted

## 2022-03-07 NOTE — Patient Outreach (Signed)
  Care Coordination   Initial Visit Note   03/07/2022 Name: Gwendolyn Snyder MRN: [id number] DOB: 11/12/1950  Gwendolyn Snyder is a 71 y.o. year old female who sees Gwendolyn Sites, MD for primary care. I spoke with  Gwendolyn Snyder by phone today.  What matters to the patients health and wellness today?  Ongoing self management of chronic medical conditions    Goals Addressed             This Visit's Progress    COMPLETED: Care Coordination Services (No follow up required)       Care Coordination Interventions: Reviewed medications with patient and discussed affordability Assessed social determinant of health barriers Assessed mobility and ability to perform ADLs Assessed family/Social support Provided patient/caregiver with verbal information on Gwendolyn Snyder [phone]) Encouraged patient to request a referral for Gwendolyn Snyder from PCP if services are needed in the future        SDOH assessments and interventions completed:  Yes  SDOH Interventions Today    Flowsheet Row Most Recent Value  SDOH Interventions   Housing Interventions Intervention Not Indicated  Transportation Interventions Intervention Not Indicated  Financial Strain Interventions Intervention Not Indicated        Care Coordination Interventions Activated:  Yes  Care Coordination Interventions:  Yes, provided   Follow up plan: No further intervention required.   Encounter Outcome:  Pt. Visit Completed   Gwendolyn Snyder, BSN, RN-BC RN Care Coordinator [place] Direct Dial: [phone] Main #: [phone]

## 2022-03-13 DIAGNOSIS — E538 Deficiency of other specified B group vitamins: Secondary | ICD-10-CM | POA: Diagnosis not present

## 2022-03-19 DIAGNOSIS — E1159 Type 2 diabetes mellitus with other circulatory complications: Secondary | ICD-10-CM | POA: Diagnosis not present

## 2022-03-19 DIAGNOSIS — E1122 Type 2 diabetes mellitus with diabetic chronic kidney disease: Secondary | ICD-10-CM | POA: Diagnosis not present

## 2022-03-19 DIAGNOSIS — I10 Essential (primary) hypertension: Secondary | ICD-10-CM | POA: Diagnosis not present

## 2022-03-19 DIAGNOSIS — N1832 Chronic kidney disease, stage 3b: Secondary | ICD-10-CM | POA: Diagnosis not present

## 2022-03-22 ENCOUNTER — Ambulatory Visit (HOSPITAL_COMMUNITY)
Admission: RE | Admit: 2022-03-22 | Discharge: 2022-03-22 | Disposition: A | Discharge status: Home / Self Care | Payer: PPO | Source: Ambulatory Visit | Attending: Family Medicine | Admitting: Family Medicine

## 2022-03-22 DIAGNOSIS — Z1231 Encounter for screening mammogram for malignant neoplasm of breast: Secondary | ICD-10-CM | POA: Diagnosis not present

## 2022-04-01 DIAGNOSIS — I129 Hypertensive chronic kidney disease with stage 1 through stage 4 chronic kidney disease, or unspecified chronic kidney disease: Secondary | ICD-10-CM | POA: Diagnosis not present

## 2022-04-01 DIAGNOSIS — E1122 Type 2 diabetes mellitus with diabetic chronic kidney disease: Secondary | ICD-10-CM | POA: Diagnosis not present

## 2022-04-01 DIAGNOSIS — R809 Proteinuria, unspecified: Secondary | ICD-10-CM | POA: Diagnosis not present

## 2022-04-01 DIAGNOSIS — N189 Chronic kidney disease, unspecified: Secondary | ICD-10-CM | POA: Diagnosis not present

## 2022-04-01 DIAGNOSIS — E1129 Type 2 diabetes mellitus with other diabetic kidney complication: Secondary | ICD-10-CM | POA: Diagnosis not present

## 2022-04-01 DIAGNOSIS — E870 Hyperosmolality and hypernatremia: Secondary | ICD-10-CM | POA: Diagnosis not present

## 2022-04-01 DIAGNOSIS — N200 Calculus of kidney: Secondary | ICD-10-CM | POA: Diagnosis not present

## 2022-04-12 DIAGNOSIS — E538 Deficiency of other specified B group vitamins: Secondary | ICD-10-CM | POA: Diagnosis not present

## 2022-04-23 DIAGNOSIS — N189 Chronic kidney disease, unspecified: Secondary | ICD-10-CM | POA: Diagnosis not present

## 2022-04-23 DIAGNOSIS — E1129 Type 2 diabetes mellitus with other diabetic kidney complication: Secondary | ICD-10-CM | POA: Diagnosis not present

## 2022-04-23 DIAGNOSIS — E870 Hyperosmolality and hypernatremia: Secondary | ICD-10-CM | POA: Diagnosis not present

## 2022-04-23 DIAGNOSIS — I129 Hypertensive chronic kidney disease with stage 1 through stage 4 chronic kidney disease, or unspecified chronic kidney disease: Secondary | ICD-10-CM | POA: Diagnosis not present

## 2022-04-23 DIAGNOSIS — R809 Proteinuria, unspecified: Secondary | ICD-10-CM | POA: Diagnosis not present

## 2022-04-23 DIAGNOSIS — E1122 Type 2 diabetes mellitus with diabetic chronic kidney disease: Secondary | ICD-10-CM | POA: Diagnosis not present

## 2022-04-23 DIAGNOSIS — N200 Calculus of kidney: Secondary | ICD-10-CM | POA: Diagnosis not present

## 2022-04-26 DIAGNOSIS — N1832 Chronic kidney disease, stage 3b: Secondary | ICD-10-CM | POA: Diagnosis not present

## 2022-04-26 DIAGNOSIS — Z6831 Body mass index (BMI) 31.0-31.9, adult: Secondary | ICD-10-CM | POA: Diagnosis not present

## 2022-04-26 DIAGNOSIS — J449 Chronic obstructive pulmonary disease, unspecified: Secondary | ICD-10-CM | POA: Diagnosis not present

## 2022-05-08 DIAGNOSIS — E1129 Type 2 diabetes mellitus with other diabetic kidney complication: Secondary | ICD-10-CM | POA: Diagnosis not present

## 2022-05-08 DIAGNOSIS — R809 Proteinuria, unspecified: Secondary | ICD-10-CM | POA: Diagnosis not present

## 2022-05-08 DIAGNOSIS — E1122 Type 2 diabetes mellitus with diabetic chronic kidney disease: Secondary | ICD-10-CM | POA: Diagnosis not present

## 2022-05-08 DIAGNOSIS — I129 Hypertensive chronic kidney disease with stage 1 through stage 4 chronic kidney disease, or unspecified chronic kidney disease: Secondary | ICD-10-CM | POA: Diagnosis not present

## 2022-05-08 DIAGNOSIS — N200 Calculus of kidney: Secondary | ICD-10-CM | POA: Diagnosis not present

## 2022-05-08 DIAGNOSIS — E870 Hyperosmolality and hypernatremia: Secondary | ICD-10-CM | POA: Diagnosis not present

## 2022-05-08 DIAGNOSIS — N189 Chronic kidney disease, unspecified: Secondary | ICD-10-CM | POA: Diagnosis not present

## 2022-05-14 DIAGNOSIS — E538 Deficiency of other specified B group vitamins: Secondary | ICD-10-CM | POA: Diagnosis not present

## 2022-06-03 ENCOUNTER — Ambulatory Visit (HOSPITAL_COMMUNITY)
Admission: RE | Admit: 2022-06-03 | Discharge: 2022-06-03 | Disposition: A | Discharge status: Home / Self Care | Payer: PPO | Source: Ambulatory Visit | Attending: Urology | Admitting: Urology

## 2022-06-03 DIAGNOSIS — N200 Calculus of kidney: Secondary | ICD-10-CM | POA: Insufficient documentation

## 2022-06-06 ENCOUNTER — Ambulatory Visit (INDEPENDENT_AMBULATORY_CARE_PROVIDER_SITE_OTHER): Payer: PPO | Admitting: Urology

## 2022-06-06 VITALS — BP 149/84 | HR 75

## 2022-06-06 DIAGNOSIS — M1030 Gout due to renal impairment, unspecified site: Secondary | ICD-10-CM

## 2022-06-06 DIAGNOSIS — N200 Calculus of kidney: Secondary | ICD-10-CM

## 2022-06-06 DIAGNOSIS — N289 Disorder of kidney and ureter, unspecified: Secondary | ICD-10-CM | POA: Diagnosis not present

## 2022-06-06 LAB — URINALYSIS, ROUTINE W REFLEX MICROSCOPIC
Bilirubin, UA: NEGATIVE
Glucose, UA: NEGATIVE
Ketones, UA: NEGATIVE
Nitrite, UA: NEGATIVE
Specific Gravity, UA: 1.02 (ref 1.005–1.030)
Urobilinogen, Ur: 0.2 mg/dL (ref 0.2–1.0)
pH, UA: 5 (ref 5.0–7.5)

## 2022-06-06 LAB — MICROSCOPIC EXAMINATION

## 2022-06-06 NOTE — Progress Notes (Signed)
Subjective:  Athea returns today in f/u from a second ESWL on 01/21/19 for her recurrent left renal stones.  The initial ESWL was in 7/20.  I reviewed the films and report from her recent CT with her.  There is significant residual stone burden.   She has had no flank pain or hematuria.   A KUB prior to this visit shows stable bilateral renal stones, left > right, and no ureteral stone.  UA is clear.    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 She remains on allopurinol and potassium citrate. Her stones were 80% UA and 20% CaOx.   She had chemistries in 7/20 and her Calcium was 10.7 and the Cr was 1.25.   She is due labs with Dr. Hilma Favors in the near future.    06/07/21: Kaelynn returns today in f/u for her history of stones.  KUB prior to this visit shows stable bilateral renal stones, left > right.  Last labs in Epic showed a Cr of 1.58.  She remains on allopurinol and potassium citrate.  She had stomach trouble over the summer.  She has cut out mountain dew at the recommendation of nephrology and has had some improvement in her renal function.   Her Cr was 1.20 on 02/02/21 and 1.55 on 03/22/21.  Her potassium is 4.4 but she had been off of the potassium citrate for a while but is back on it now. .  She has seen Dr. Hilma Favors and there are some concerns about possible hyperthyroidism.  UA today has 11-30 WBC and many bacteria.  She has no hematuria, dysuria or flank pain.     06/06/22: Julianah returns today in f/u for her history of stones.  She has had no pain or hematuria.  Her UA has a few WBC's.  She is on allopurinal 150mg  and potassium citrate 15 meq tid.  A KUB prior to this visit shows an increase in stone burden, particulary on the left.     ROS:  ROS:  A complete review of systems was performed.  All systems are negative except for pertinent findings as noted.   ROS  Allergies  Allergen Reactions   Aspirin Other (See Comments)    Makes ears ring and heart  beat fast   Codeine Other (See Comments)    Hard to wake up   Flomax [Tamsulosin Hcl] Nausea And Vomiting   Pravastatin     Myalgias     Outpatient Encounter Medications as of 06/06/2022  Medication Sig   acetaminophen (TYLENOL) 650 MG CR tablet Take 1,300 mg by mouth 2 (two) times daily.   allopurinol (ZYLOPRIM) 300 MG tablet Take 1 tablet (300 mg total) by mouth every evening.   amLODipine (NORVASC) 10 MG tablet Take 10 mg by mouth daily.   amLODipine (NORVASC) 10 MG tablet Take 1 tablet by mouth daily.   carvedilol (COREG) 3.125 MG tablet Take 6.25 mg by mouth 2 (two) times daily.   cholecalciferol (VITAMIN D) 1000 units tablet Take 1,000 Units by mouth daily.   Coenzyme Q10 (CO Q 10) 100 MG CAPS Take 100 mg by mouth daily.   cyanocobalamin (,VITAMIN B-12,) 1000 MCG/ML injection Inject 1,000 mcg into the skin every 30 (thirty) days.   ezetimibe (ZETIA) 10 MG tablet Take 10 mg by mouth daily.   FARXIGA 5 MG TABS tablet Take 5 mg by mouth daily.   FREESTYLE LITE test strip Use to test blood sugar  once daily   Inositol Niacinate 500 MG CAPS Take 500 mg by mouth 2 (two) times daily.    latanoprost (XALATAN) 0.005 % ophthalmic solution Place 1 drop into both eyes at bedtime.   latanoprost (XALATAN) 0.005 % ophthalmic solution Apply to eye.   niacin 500 MG tablet Take by mouth.   omega-3 acid ethyl esters (LOVAZA) 1 g capsule Take 1 g by mouth 2 (two) times daily.    ondansetron (ZOFRAN) 4 MG tablet Take 1 tablet (4 mg total) by mouth every 6 (six) hours as needed for nausea.   pantoprazole (PROTONIX) 40 MG tablet TAKE ONE TABLET BY MOUTH ONCE DAILY   pantoprazole (PROTONIX) 40 MG tablet Take by mouth.   Potassium Citrate 15 MEQ (1620 MG) TBCR Take 2 tabs qam and 1 tab qpm po (Patient taking differently: Take 1 tablet by mouth in the morning and at bedtime.)   rosuvastatin (CRESTOR) 5 MG tablet Take 5 mg by mouth at bedtime.   sitaGLIPtin (JANUVIA) 100 MG tablet Take 1 tablet (100 mg  total) by mouth daily.   [DISCONTINUED] famotidine (PEPCID) 20 MG tablet Take 1 tablet (20 mg total) by mouth 2 (two) times daily.   [DISCONTINUED] omeprazole (PRILOSEC) 20 MG capsule Take 1 capsule (20 mg total) by mouth daily.   No facility-administered encounter medications on file as of 06/06/2022.    Past Medical History:  Diagnosis Date   Anemia    Anxiety    Arthritis    Bilateral carpal tunnel syndrome    right hand- surgery , left hand has not and left hand goes to sleep    Cataracts, bilateral    Complication of anesthesia    once after surgery felt like she could not breathe- 11/09/15- surgery greater than 11 years ago   COPD (chronic obstructive pulmonary disease) (HCC)    Diabetes mellitus without complication (HCC)    diet controlled , type II    Family history of adverse reaction to anesthesia    sister- confused   Glaucoma    History of hiatal hernia    History of kidney stones    Hypertension    Renal disorder    Shortness of breath dyspnea    with exertion    Past Surgical History:  Procedure Laterality Date   ABDOMINAL HYSTERECTOMY     BIOPSY  10/24/2020   Procedure: BIOPSY;  Surgeon: [name], DO;  Location: [facility];  Service: Endoscopy;;   CARPAL TUNNEL RELEASE Right    COLONOSCOPY     COLONOSCOPY N/A 01/05/2019   normal   CYSTOSCOPY/RETROGRADE/URETEROSCOPY/STONE EXTRACTION WITH BASKET Left 09/17/2016   Procedure: CYSTOSCOPY/RETROGRADE/URETEROSCOPY/STONE EXTRACTION WITH BASKET/ STENT REMOVAL;  Surgeon: Bjorn Pippin, MD;  Location: [facility];  Service: Urology;  Laterality: Left;   ESOPHAGOGASTRODUODENOSCOPY (EGD) WITH PROPOFOL N/A 10/24/2020   large hiatal hernia, gastritis s/p biopsy. Normal duodenum. Negative H.pylori.   EXTRACORPOREAL SHOCK WAVE LITHOTRIPSY Left 11/16/2018   Procedure: EXTRACORPOREAL SHOCK WAVE LITHOTRIPSY (ESWL);  Surgeon: Crista Elliot, MD;  Location: [facility];  Service: Urology;  Laterality: Left;   EXTRACORPOREAL  SHOCK WAVE LITHOTRIPSY Left 01/21/2019   Procedure: EXTRACORPOREAL SHOCK WAVE LITHOTRIPSY (ESWL);  Surgeon: Bjorn Pippin, MD;  Location: [facility];  Service: Urology;  Laterality: Left;   IR URETERAL STENT LEFT NEW ACCESS W/O SEP NEPHROSTOMY CATH  08/20/2016   JOINT REPLACEMENT     NEPHROLITHOTOMY Left 08/20/2016   Procedure: LEFT NEPHROLITHOTOMY PERCUTANEOUS FIRST STAGE;  Surgeon: Bjorn Pippin, MD;  Location:  [facility];  Service: Urology;  Laterality: Left;   NEPHROLITHOTOMY Left 08/22/2016   Procedure: LEFT NEPHROLITHOTOMY PERCUTANEOUS SECOND LOOK;  Surgeon: Irine Seal, MD;  Location: [facility];  Service: Urology;  Laterality: Left;   Removal kidney stone     TOTAL HIP ARTHROPLASTY Bilateral    TOTAL HIP REVISION Left 11/22/2015   Procedure: Revision Left Total Hip Arthroplasty, Poly and Diona Foley Exchange;  Surgeon: Marybelle Killings, MD;  Location: [facility];  Service: Orthopedics;  Laterality: Left;   TOTAL KNEE ARTHROPLASTY Right     Social History   Socioeconomic History   Marital status: Single    Spouse name: Not on file   Number of children: Not on file   Years of education: Not on file   Highest education level: Not on file  Occupational History   Not on file  Tobacco Use   Smoking status: Former    Packs/day: 1.00    Years: 30.00    Total pack years: 30.00    Types: Cigarettes    Quit date: 05/13/2004    Years since quitting: 18.0   Smokeless tobacco: Never  Vaping Use   Vaping Use: Never used  Substance and Sexual Activity   Alcohol use: No    Alcohol/week: 0.0 standard drinks of alcohol   Drug use: No   Sexual activity: Not on file  Other Topics Concern   Not on file  Social History Narrative   Not on file   Social Determinants of Health   Financial Resource Strain: Low Risk  (03/07/2022)   Overall Financial Resource Strain (CARDIA)    Difficulty of Paying Living Expenses: Not very hard  Food Insecurity: Not on file  Transportation Needs: No Transportation Needs (03/07/2022)    PRAPARE - Hydrologist (Medical): No    Lack of Transportation (Non-Medical): No  Physical Activity: Not on file  Stress: Not on file  Social Connections: Not on file  Intimate Partner Violence: Not on file    Family History  Problem Relation Age of Onset   Colon cancer Neg Hx    Colon polyps Neg Hx        Objective: Vitals:   06/06/22 0906  BP: (!) 149/84  Pulse: 75      Physical Exam Vitals reviewed.  Constitutional:      Appearance: Normal appearance.  Neurological:     Mental Status: She is alert.     Lab Results:  No results found for this or any previous visit (from the past 24 hour(s)).    BMET No results for input(s): "NA", "K", "CL", "CO2", "GLUCOSE", "BUN", "CREATININE", "CALCIUM" in the last 72 hours. PSA No results found for: "PSA" No results found for: "TESTOSTERONE"  UA has 6-10  WBC with few bacteria.  Studies/Results: DG Abd 1 View  Result Date: 06/05/2022 CLINICAL DATA:  History of renal calculi EXAM: ABDOMEN - 1 VIEW COMPARISON:  11/24/2021 FINDINGS: Multiple bilateral renal calculi are observed including a 3.1 by 2.0 cm cluster of calculi in the left kidney upper pole and multiple calculi measuring up to 1.9 cm in long axis clustered along the left kidney lower pole. At least 5 right renal calculi are identified, measuring up to about 0.8 cm in diameter. Vascular calcification noted in the right upper anatomic pelvis. No definite stone is identified projecting over the ureters or urinary bladder. Bilateral total hip prostheses. Substantial lumbar spondylosis and degenerative disc disease. Large hiatal hernia partially visualized. IMPRESSION: 1.  Multiple bilateral renal calculi. 2. Large hiatal hernia. 3. Substantial lumbar spondylosis and degenerative disc disease. 4. Bilateral total hip prostheses. Electronically Signed   By: Gaylyn Rong M.D.   On: 06/05/2022 09:14    I have reviewed office notes and labs from  Dr. Wolfgang Phoenix.  Recent Cr was 1.5.   Assessment & Plan: Bilateral renal stones with a possible increased stone burden left > right.  She is asymptomatic.  I will have her return for a CT in 6 months.   Continue allopurinol and potassium citrate.   History of uric acid stones with gouty nephropathy.          No orders of the defined types were placed in this encounter.    Orders Placed This Encounter  Procedures   Microscopic Examination   CT RENAL STONE STUDY    Standing Status:   Future    Standing Expiration Date:   06/07/2023    Order Specific Question:   Preferred imaging location?    Answer:   [facility]    Order Specific Question:   Radiology Contrast Protocol - do NOT remove file path    Answer:   [email or web address]   Urinalysis, Routine w reflex microscopic      Return in about 6 months (around 12/05/2022) for with CT.    CC: Assunta Found, MD and Dr. Celso Amy.    Bjorn Pippin 06/07/2022 Patient ID: Paralee Cancel, female   DOB: [birth year], 72 y.o.   MRN: [id number]

## 2022-06-20 DIAGNOSIS — H401132 Primary open-angle glaucoma, bilateral, moderate stage: Secondary | ICD-10-CM | POA: Diagnosis not present

## 2022-06-24 DIAGNOSIS — Z1331 Encounter for screening for depression: Secondary | ICD-10-CM | POA: Diagnosis not present

## 2022-06-24 DIAGNOSIS — T461X5A Adverse effect of calcium-channel blockers, initial encounter: Secondary | ICD-10-CM | POA: Diagnosis not present

## 2022-06-24 DIAGNOSIS — Z6830 Body mass index (BMI) 30.0-30.9, adult: Secondary | ICD-10-CM | POA: Diagnosis not present

## 2022-06-24 DIAGNOSIS — E782 Mixed hyperlipidemia: Secondary | ICD-10-CM | POA: Diagnosis not present

## 2022-06-24 DIAGNOSIS — I10 Essential (primary) hypertension: Secondary | ICD-10-CM | POA: Diagnosis not present

## 2022-06-24 DIAGNOSIS — E119 Type 2 diabetes mellitus without complications: Secondary | ICD-10-CM | POA: Diagnosis not present

## 2022-06-24 DIAGNOSIS — D649 Anemia, unspecified: Secondary | ICD-10-CM | POA: Diagnosis not present

## 2022-06-24 DIAGNOSIS — R946 Abnormal results of thyroid function studies: Secondary | ICD-10-CM | POA: Diagnosis not present

## 2022-06-24 DIAGNOSIS — E6609 Other obesity due to excess calories: Secondary | ICD-10-CM | POA: Diagnosis not present

## 2022-06-24 DIAGNOSIS — H409 Unspecified glaucoma: Secondary | ICD-10-CM | POA: Diagnosis not present

## 2022-06-24 DIAGNOSIS — Z0001 Encounter for general adult medical examination with abnormal findings: Secondary | ICD-10-CM | POA: Diagnosis not present

## 2022-06-24 DIAGNOSIS — I700 Atherosclerosis of aorta: Secondary | ICD-10-CM | POA: Diagnosis not present

## 2022-06-25 DIAGNOSIS — E870 Hyperosmolality and hypernatremia: Secondary | ICD-10-CM | POA: Diagnosis not present

## 2022-06-25 DIAGNOSIS — E1159 Type 2 diabetes mellitus with other circulatory complications: Secondary | ICD-10-CM | POA: Diagnosis not present

## 2022-06-25 DIAGNOSIS — I129 Hypertensive chronic kidney disease with stage 1 through stage 4 chronic kidney disease, or unspecified chronic kidney disease: Secondary | ICD-10-CM | POA: Diagnosis not present

## 2022-06-25 DIAGNOSIS — E7849 Other hyperlipidemia: Secondary | ICD-10-CM | POA: Diagnosis not present

## 2022-06-25 DIAGNOSIS — E1122 Type 2 diabetes mellitus with diabetic chronic kidney disease: Secondary | ICD-10-CM | POA: Diagnosis not present

## 2022-06-25 DIAGNOSIS — R809 Proteinuria, unspecified: Secondary | ICD-10-CM | POA: Diagnosis not present

## 2022-06-25 DIAGNOSIS — N200 Calculus of kidney: Secondary | ICD-10-CM | POA: Diagnosis not present

## 2022-06-25 DIAGNOSIS — E1129 Type 2 diabetes mellitus with other diabetic kidney complication: Secondary | ICD-10-CM | POA: Diagnosis not present

## 2022-06-25 DIAGNOSIS — N189 Chronic kidney disease, unspecified: Secondary | ICD-10-CM | POA: Diagnosis not present

## 2022-06-25 DIAGNOSIS — R946 Abnormal results of thyroid function studies: Secondary | ICD-10-CM | POA: Diagnosis not present

## 2022-07-02 DIAGNOSIS — R82991 Hypocitraturia: Secondary | ICD-10-CM | POA: Diagnosis not present

## 2022-07-02 DIAGNOSIS — E1122 Type 2 diabetes mellitus with diabetic chronic kidney disease: Secondary | ICD-10-CM | POA: Diagnosis not present

## 2022-07-02 DIAGNOSIS — N189 Chronic kidney disease, unspecified: Secondary | ICD-10-CM | POA: Diagnosis not present

## 2022-07-02 DIAGNOSIS — E870 Hyperosmolality and hypernatremia: Secondary | ICD-10-CM | POA: Diagnosis not present

## 2022-07-02 DIAGNOSIS — I129 Hypertensive chronic kidney disease with stage 1 through stage 4 chronic kidney disease, or unspecified chronic kidney disease: Secondary | ICD-10-CM | POA: Diagnosis not present

## 2022-07-02 DIAGNOSIS — N200 Calculus of kidney: Secondary | ICD-10-CM | POA: Diagnosis not present

## 2022-07-02 DIAGNOSIS — E1129 Type 2 diabetes mellitus with other diabetic kidney complication: Secondary | ICD-10-CM | POA: Diagnosis not present

## 2022-07-02 DIAGNOSIS — R809 Proteinuria, unspecified: Secondary | ICD-10-CM | POA: Diagnosis not present

## 2022-07-12 DIAGNOSIS — E538 Deficiency of other specified B group vitamins: Secondary | ICD-10-CM | POA: Diagnosis not present

## 2022-07-24 DIAGNOSIS — H401132 Primary open-angle glaucoma, bilateral, moderate stage: Secondary | ICD-10-CM | POA: Diagnosis not present

## 2022-08-12 DIAGNOSIS — H401131 Primary open-angle glaucoma, bilateral, mild stage: Secondary | ICD-10-CM | POA: Diagnosis not present

## 2022-08-12 DIAGNOSIS — E538 Deficiency of other specified B group vitamins: Secondary | ICD-10-CM | POA: Diagnosis not present

## 2022-09-09 DIAGNOSIS — H401131 Primary open-angle glaucoma, bilateral, mild stage: Secondary | ICD-10-CM | POA: Diagnosis not present

## 2022-09-11 DIAGNOSIS — E538 Deficiency of other specified B group vitamins: Secondary | ICD-10-CM | POA: Diagnosis not present

## 2022-09-18 DIAGNOSIS — H401112 Primary open-angle glaucoma, right eye, moderate stage: Secondary | ICD-10-CM | POA: Diagnosis not present

## 2022-09-25 DIAGNOSIS — H401131 Primary open-angle glaucoma, bilateral, mild stage: Secondary | ICD-10-CM | POA: Diagnosis not present

## 2022-09-27 DIAGNOSIS — R809 Proteinuria, unspecified: Secondary | ICD-10-CM | POA: Diagnosis not present

## 2022-09-27 DIAGNOSIS — I129 Hypertensive chronic kidney disease with stage 1 through stage 4 chronic kidney disease, or unspecified chronic kidney disease: Secondary | ICD-10-CM | POA: Diagnosis not present

## 2022-09-27 DIAGNOSIS — N189 Chronic kidney disease, unspecified: Secondary | ICD-10-CM | POA: Diagnosis not present

## 2022-09-27 DIAGNOSIS — N200 Calculus of kidney: Secondary | ICD-10-CM | POA: Diagnosis not present

## 2022-09-27 DIAGNOSIS — E1129 Type 2 diabetes mellitus with other diabetic kidney complication: Secondary | ICD-10-CM | POA: Diagnosis not present

## 2022-09-27 DIAGNOSIS — Z79899 Other long term (current) drug therapy: Secondary | ICD-10-CM | POA: Diagnosis not present

## 2022-09-27 DIAGNOSIS — R82991 Hypocitraturia: Secondary | ICD-10-CM | POA: Diagnosis not present

## 2022-10-10 DIAGNOSIS — N200 Calculus of kidney: Secondary | ICD-10-CM | POA: Diagnosis not present

## 2022-10-10 DIAGNOSIS — E1122 Type 2 diabetes mellitus with diabetic chronic kidney disease: Secondary | ICD-10-CM | POA: Diagnosis not present

## 2022-10-10 DIAGNOSIS — R809 Proteinuria, unspecified: Secondary | ICD-10-CM | POA: Diagnosis not present

## 2022-10-10 DIAGNOSIS — E870 Hyperosmolality and hypernatremia: Secondary | ICD-10-CM | POA: Diagnosis not present

## 2022-10-14 DIAGNOSIS — E538 Deficiency of other specified B group vitamins: Secondary | ICD-10-CM | POA: Diagnosis not present

## 2022-11-10 DIAGNOSIS — I129 Hypertensive chronic kidney disease with stage 1 through stage 4 chronic kidney disease, or unspecified chronic kidney disease: Secondary | ICD-10-CM | POA: Diagnosis not present

## 2022-11-10 DIAGNOSIS — E1122 Type 2 diabetes mellitus with diabetic chronic kidney disease: Secondary | ICD-10-CM | POA: Diagnosis not present

## 2022-11-10 DIAGNOSIS — E782 Mixed hyperlipidemia: Secondary | ICD-10-CM | POA: Diagnosis not present

## 2022-11-10 DIAGNOSIS — E1159 Type 2 diabetes mellitus with other circulatory complications: Secondary | ICD-10-CM | POA: Diagnosis not present

## 2022-11-11 DIAGNOSIS — E538 Deficiency of other specified B group vitamins: Secondary | ICD-10-CM | POA: Diagnosis not present

## 2022-11-28 DIAGNOSIS — Z961 Presence of intraocular lens: Secondary | ICD-10-CM | POA: Diagnosis not present

## 2022-11-28 DIAGNOSIS — H401112 Primary open-angle glaucoma, right eye, moderate stage: Secondary | ICD-10-CM | POA: Diagnosis not present

## 2022-12-05 ENCOUNTER — Ambulatory Visit (HOSPITAL_COMMUNITY)
Admission: RE | Admit: 2022-12-05 | Discharge: 2022-12-05 | Disposition: A | Discharge status: Home / Self Care | Payer: PPO | Source: Ambulatory Visit | Attending: Urology | Admitting: Urology

## 2022-12-05 DIAGNOSIS — R160 Hepatomegaly, not elsewhere classified: Secondary | ICD-10-CM | POA: Diagnosis not present

## 2022-12-05 DIAGNOSIS — K449 Diaphragmatic hernia without obstruction or gangrene: Secondary | ICD-10-CM | POA: Diagnosis not present

## 2022-12-05 DIAGNOSIS — N200 Calculus of kidney: Secondary | ICD-10-CM | POA: Insufficient documentation

## 2022-12-05 DIAGNOSIS — K8020 Calculus of gallbladder without cholecystitis without obstruction: Secondary | ICD-10-CM | POA: Diagnosis not present

## 2022-12-12 ENCOUNTER — Encounter: Payer: Self-pay | Admitting: Urology

## 2022-12-12 ENCOUNTER — Ambulatory Visit: Payer: PPO | Admitting: Urology

## 2022-12-12 VITALS — BP 165/82 | HR 75 | Ht 63.0 in | Wt 177.0 lb

## 2022-12-12 DIAGNOSIS — N289 Disorder of kidney and ureter, unspecified: Secondary | ICD-10-CM

## 2022-12-12 DIAGNOSIS — N200 Calculus of kidney: Secondary | ICD-10-CM

## 2022-12-12 DIAGNOSIS — E538 Deficiency of other specified B group vitamins: Secondary | ICD-10-CM | POA: Diagnosis not present

## 2022-12-12 DIAGNOSIS — M1030 Gout due to renal impairment, unspecified site: Secondary | ICD-10-CM | POA: Diagnosis not present

## 2022-12-12 LAB — MICROSCOPIC EXAMINATION

## 2022-12-12 LAB — URINALYSIS, ROUTINE W REFLEX MICROSCOPIC
Bilirubin, UA: NEGATIVE
Glucose, UA: NEGATIVE
Ketones, UA: NEGATIVE
Nitrite, UA: NEGATIVE
Protein,UA: NEGATIVE
RBC, UA: NEGATIVE
Specific Gravity, UA: 1.02 (ref 1.005–1.030)
Urobilinogen, Ur: 0.2 mg/dL (ref 0.2–1.0)
pH, UA: 6 (ref 5.0–7.5)

## 2022-12-12 MED ORDER — POTASSIUM CITRATE ER 15 MEQ (1620 MG) PO TBCR: 1.0000 | EXTENDED_RELEASE_TABLET | Freq: Two times a day (BID) | ORAL | 3 refills | Status: DC

## 2022-12-12 MED ORDER — ALLOPURINOL 300 MG PO TABS: 300.0000 mg | ORAL_TABLET | Freq: Every evening | ORAL | 3 refills | Status: DC

## 2022-12-12 NOTE — Progress Notes (Signed)
Subjective:  Gwendolyn Snyder returns today in f/u from a second ESWL on 01/21/19 for her recurrent left renal stones.  The initial ESWL was in 7/20.  I reviewed the films and report from her recent CT with her.  There is significant residual stone burden.   She has had no flank pain or hematuria.   A KUB prior to this visit shows stable bilateral renal stones, left > right, and no ureteral stone.  UA is clear.    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 She remains on allopurinol and potassium citrate. Her stones were 80% UA and 20% CaOx.   She had chemistries in 7/20 and her Calcium was 10.7 and the Cr was 1.25.   She is due labs with Dr. Phillips Odor in the near future.    06/07/21: Gwendolyn Snyder returns today in f/u for her history of stones.  KUB prior to this visit shows stable bilateral renal stones, left > right.  Last labs in Epic showed a Cr of 1.58.  She remains on allopurinol and potassium citrate.  She had stomach trouble over the summer.  She has cut out mountain dew at the recommendation of nephrology and has had some improvement in her renal function.   Her Cr was 1.20 on 02/02/21 and 1.55 on 03/22/21.  Her potassium is 4.4 but she had been off of the potassium citrate for a while but is back on it now. .  She has seen Dr. Phillips Odor and there are some concerns about possible hyperthyroidism.  UA today has 11-30 WBC and many bacteria.  She has no hematuria, dysuria or flank pain.     06/06/22: Gwendolyn Snyder returns today in f/u for her history of stones.  She has had no pain or hematuria.  Her UA has a few WBC's.  She is on allopurinal 150mg  and potassium citrate 15 meq tid.  A KUB prior to this visit shows an increase in stone burden, particulary on the left.   12/12/22: Gwendolyn Snyder returns today in f/u for her history of stones.  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  She has no flank pain or hematuria.  She has no dysuria.   She has CKD 3b/4 and is followed by Dr. Wolfgang Phoenix.  Her UA is unremarkable.   She remains non potassium citrate and allopurinol     ROS:  ROS:  A complete review of systems was performed.  All systems are negative except for pertinent findings as noted.   Review of Systems  Constitutional:  Positive for malaise/fatigue.  Eyes:  Positive for blurred vision (right eye glaucoma issues).  Respiratory:  Positive for shortness of breath.   Musculoskeletal:  Positive for joint pain.    Allergies  Allergen Reactions   Aspirin Other (See Comments)    Makes ears ring and heart beat fast   Codeine Other (See Comments)    Hard to wake up   Flomax [Tamsulosin Hcl] Nausea And Vomiting   Pravastatin     Myalgias     Outpatient Encounter Medications as of 12/12/2022  Medication Sig   acetaminophen (TYLENOL) 650 MG CR tablet Take 1,300 mg by mouth 2 (two) times daily.   amLODipine (NORVASC) 10 MG tablet Take  10 mg by mouth daily.   amLODipine (NORVASC) 10 MG tablet Take 1 tablet by mouth daily.   carvedilol (COREG) 3.125 MG tablet Take 6.25 mg by mouth 2 (two) times daily.   cholecalciferol (VITAMIN D) 1000 units tablet Take 1,000 Units by mouth daily.   Coenzyme Q10 (CO Q 10) 100 MG CAPS Take 100 mg by mouth daily.   cyanocobalamin (,VITAMIN B-12,) 1000 MCG/ML injection Inject 1,000 mcg into the skin every 30 (thirty) days.   ezetimibe (ZETIA) 10 MG tablet Take 10 mg by mouth daily.   FARXIGA 5 MG TABS tablet Take 5 mg by mouth daily.   FREESTYLE LITE test strip Use to test blood sugar once daily   Inositol Niacinate 500 MG CAPS Take 500 mg by mouth 2 (two) times daily.    latanoprost (XALATAN) 0.005 % ophthalmic solution Place 1 drop into both eyes at bedtime.   latanoprost (XALATAN) 0.005 % ophthalmic solution Apply to eye.   niacin 500 MG tablet Take by mouth.    omega-3 acid ethyl esters (LOVAZA) 1 g capsule Take 1 g by mouth 2 (two) times daily.    ondansetron (ZOFRAN) 4 MG tablet Take 1 tablet (4 mg total) by mouth every 6 (six) hours as needed for nausea.   pantoprazole (PROTONIX) 40 MG tablet TAKE ONE TABLET BY MOUTH ONCE DAILY   pantoprazole (PROTONIX) 40 MG tablet Take by mouth.   rosuvastatin (CRESTOR) 5 MG tablet Take 5 mg by mouth at bedtime.   sitaGLIPtin (JANUVIA) 100 MG tablet Take 1 tablet (100 mg total) by mouth daily.   [DISCONTINUED] allopurinol (ZYLOPRIM) 300 MG tablet Take 1 tablet (300 mg total) by mouth every evening.   [DISCONTINUED] Potassium Citrate 15 MEQ (1620 MG) TBCR Take 2 tabs qam and 1 tab qpm po (Patient taking differently: Take 1 tablet by mouth in the morning and at bedtime.)   allopurinol (ZYLOPRIM) 300 MG tablet Take 1 tablet (300 mg total) by mouth every evening.   Potassium Citrate 15 MEQ (1620 MG) TBCR Take 1 tablet by mouth in the morning and at bedtime.   [DISCONTINUED] famotidine (PEPCID) 20 MG tablet Take 1 tablet (20 mg total) by mouth 2 (two) times daily.   [DISCONTINUED] omeprazole (PRILOSEC) 20 MG capsule Take 1 capsule (20 mg total) by mouth daily.   No facility-administered encounter medications on file as of 12/12/2022.    Past Medical History:  Diagnosis Date   Anemia    Anxiety    Arthritis    Bilateral carpal tunnel syndrome    right hand- surgery , left hand has not and left hand goes to sleep    Cataracts, bilateral    Complication of anesthesia    once after surgery felt like she could not breathe- 11/09/15- surgery greater than 11 years ago   COPD (chronic obstructive pulmonary disease) (HCC)    Diabetes mellitus without complication (HCC)    diet controlled , type II    Family history of adverse reaction to anesthesia    sister- confused   Glaucoma    History of hiatal hernia    History of kidney stones    Hypertension    Renal disorder    Shortness of breath dyspnea    with exertion     Past Surgical History:  Procedure Laterality Date   ABDOMINAL HYSTERECTOMY     BIOPSY  10/24/2020   Procedure: BIOPSY;  Surgeon: [name], DO;  Location: [facility];  Service: Endoscopy;;  CARPAL TUNNEL RELEASE Right    COLONOSCOPY     COLONOSCOPY N/A 01/05/2019   normal   CYSTOSCOPY/RETROGRADE/URETEROSCOPY/STONE EXTRACTION WITH BASKET Left 09/17/2016   Procedure: CYSTOSCOPY/RETROGRADE/URETEROSCOPY/STONE EXTRACTION WITH BASKET/ STENT REMOVAL;  Surgeon: Bjorn Pippin, MD;  Location: [facility];  Service: Urology;  Laterality: Left;   ESOPHAGOGASTRODUODENOSCOPY (EGD) WITH PROPOFOL N/A 10/24/2020   large hiatal hernia, gastritis s/p biopsy. Normal duodenum. Negative H.pylori.   EXTRACORPOREAL SHOCK WAVE LITHOTRIPSY Left 11/16/2018   Procedure: EXTRACORPOREAL SHOCK WAVE LITHOTRIPSY (ESWL);  Surgeon: Crista Elliot, MD;  Location: [facility];  Service: Urology;  Laterality: Left;   EXTRACORPOREAL SHOCK WAVE LITHOTRIPSY Left 01/21/2019   Procedure: EXTRACORPOREAL SHOCK WAVE LITHOTRIPSY (ESWL);  Surgeon: Bjorn Pippin, MD;  Location: [facility];  Service: Urology;  Laterality: Left;   IR URETERAL STENT LEFT NEW ACCESS W/O SEP NEPHROSTOMY CATH  08/20/2016   JOINT REPLACEMENT     NEPHROLITHOTOMY Left 08/20/2016   Procedure: LEFT NEPHROLITHOTOMY PERCUTANEOUS FIRST STAGE;  Surgeon: Bjorn Pippin, MD;  Location: [facility];  Service: Urology;  Laterality: Left;   NEPHROLITHOTOMY Left 08/22/2016   Procedure: LEFT NEPHROLITHOTOMY PERCUTANEOUS SECOND LOOK;  Surgeon: Bjorn Pippin, MD;  Location: [facility];  Service: Urology;  Laterality: Left;   Removal kidney stone     TOTAL HIP ARTHROPLASTY Bilateral    TOTAL HIP REVISION Left 11/22/2015   Procedure: Revision Left Total Hip Arthroplasty, Poly and Newman Pies Exchange;  Surgeon: Eldred Manges, MD;  Location: [facility] OR;  Service: Orthopedics;  Laterality: Left;   TOTAL KNEE ARTHROPLASTY Right     Social History   Socioeconomic History   Marital status: Single     Spouse name: Not on file   Number of children: Not on file   Years of education: Not on file   Highest education level: Not on file  Occupational History   Not on file  Tobacco Use   Smoking status: Former    Current packs/day: 0.00    Average packs/day: 1 pack/day for 30.0 years (30.0 ttl pk-yrs)    Types: Cigarettes    Start date: 05/13/1974    Quit date: 05/13/2004    Years since quitting: 18.5   Smokeless tobacco: Never  Vaping Use   Vaping status: Never Used  Substance and Sexual Activity   Alcohol use: No    Alcohol/week: 0.0 standard drinks of alcohol   Drug use: No   Sexual activity: Not on file  Other Topics Concern   Not on file  Social History Narrative   Not on file   Social Determinants of Health   Financial Resource Strain: Low Risk  (03/07/2022)   Overall Financial Resource Strain (CARDIA)    Difficulty of Paying Living Expenses: Not very hard  Food Insecurity: Not on file  Transportation Needs: No Transportation Needs (03/07/2022)   PRAPARE - Administrator, Civil Service (Medical): No    Lack of Transportation (Non-Medical): No  Physical Activity: Not on file  Stress: Not on file  Social Connections: Not on file  Intimate Partner Violence: Not on file    Family History  Problem Relation Age of Onset   Colon cancer Neg Hx    Colon polyps Neg Hx        Objective: Vitals:   12/12/22 0859  BP: (!) 165/82  Pulse: 75      Physical Exam Vitals reviewed.  Constitutional:      Appearance: Normal appearance.  Neurological:     Mental Status: She is alert.  Lab Results:  Results for orders placed or performed in visit on 12/12/22 (from the past 24 hour(s))  Urinalysis, Routine w reflex microscopic     Status: Abnormal   Collection Time: 12/12/22  9:00 AM  Result Value Ref Range   Specific Gravity, UA 1.020 1.005 - 1.030   pH, UA 6.0 5.0 - 7.5   Color, UA Yellow Yellow   Appearance Ur Clear Clear   Leukocytes,UA 1+ (A)  Negative   Protein,UA Negative Negative/Trace   Glucose, UA Negative Negative   Ketones, UA Negative Negative   RBC, UA Negative Negative   Bilirubin, UA Negative Negative   Urobilinogen, Ur 0.2 0.2 - 1.0 mg/dL   Nitrite, UA Negative Negative   Microscopic Examination See below:    Narrative   Performed at:  [address] - [facility] [address] [place], [place]  [id number] Lab Director: Chinita Pester MT, Phone:  [phone]  Microscopic Examination     Status: Abnormal   Collection Time: 12/12/22  9:00 AM   Urine  Result Value Ref Range   WBC, UA 0-5 0 - 5 /hpf   RBC, Urine 0-2 0 - 2 /hpf   Epithelial Cells (non renal) 0-10 0 - 10 /hpf   Casts Present (A) None seen /lpf   Cast Type Hyaline casts N/A   Bacteria, UA Few None seen/Few   Narrative   Performed at:  [address] - [facility] [address], [place], [place]  [id number] Lab Director: Chinita Pester MT, Phone:  [phone]     UA is unremarkable.    I reviewed labs from Dr. Wolfgang Phoenix including her Cr.  BMET No results for input(s): "NA", "K", "CL", "CO2", "GLUCOSE", "BUN", "CREATININE", "CALCIUM" in the last 72 hours. PSA No results found for: "PSA" No results found for: "TESTOSTERONE"   Studies/Results: CT RENAL STONE STUDY  Result Date: 12/12/2022 CLINICAL DATA:  Evaluate stone burden after prior lithotripsy. Lower back pain. EXAM: CT ABDOMEN AND PELVIS WITHOUT CONTRAST TECHNIQUE: Multidetector CT imaging of the abdomen and pelvis was performed following the standard protocol without IV contrast. RADIATION DOSE REDUCTION: This exam was performed according to the departmental dose-optimization program which includes automated exposure control, adjustment of the mA and/or kV according to patient size and/or use of iterative reconstruction technique. COMPARISON:  09/21/2020.  CT chest 03/20/2017. FINDINGS: Lower chest: Mild chronic volume loss in the medial aspects of both lower lobes, adjacent to a large hiatal  hernia. 3 mm lingular nodule (4/5), unchanged from 03/20/2017. Per Fleischner Society guidelines, no follow-up is necessary. Heart is enlarged. Atherosclerotic calcification of the aorta, aortic valve and coronary arteries. No pericardial effusion. Trace right pleural fluid. Hepatobiliary: Liver is enlarged, 20.9 cm. Tiny stone in the gallbladder. No biliary ductal dilatation. Pancreas: Negative. Spleen: Vague 1.8 cm low-attenuation lesion in the anterior spleen, unchanged, possibly a hemangioma. Adrenals/Urinary Tract: Adrenal glands are unremarkable. Numerous bilateral renal stones, including an 8 mm stone in the left renal pelvis. Small low-attenuation lesions in the kidneys, too small to characterize. No specific follow-up necessary. Ureters are decompressed. Bladder is low in volume and largely distorted by streak artifact from bilateral hip arthroplasties. Stomach/Bowel: Large hiatal hernia contains the entirety of the stomach. Small bowel, appendix and colon are grossly unremarkable. Vascular/Lymphatic: Atherosclerotic calcification of the aorta. No pathologically enlarged lymph nodes. Reproductive: Hysterectomy.  No adnexal mass. Other: No free fluid.  Mesenteries and peritoneum are unremarkable. Musculoskeletal: Bilateral hip arthroplasties. Osteopenia. Degenerative changes in the spine. IMPRESSION: 1. Bilateral renal  stones, including an 8 mm stone in the left renal pelvis. No hydronephrosis. 2. Trace right pleural fluid. 3. Hepatomegaly. 4. Cholelithiasis. 5. Large hiatal hernia contains the entirety of the stomach. 6. Aortic atherosclerosis (ICD10-I70.0). Coronary artery calcification. Electronically Signed   By: Leanna Battles M.D.   On: 12/12/2022 12:18    I have reviewed office notes and labs from Dr. Wolfgang Phoenix.  Recent Cr was 1.5.   Assessment & Plan: Bilateral renal stones with minimally  increased stone burden left > right.  She is asymptomatic.  I will have her return for a KUB in 6 months.    Continue allopurinol and potassium citrate.  She will need a PCNL for management but would like to hold off for now.   History of uric acid stones with gouty nephropathy.          Meds ordered this encounter  Medications   Potassium Citrate 15 MEQ (1620 MG) TBCR    Sig: Take 1 tablet by mouth in the morning and at bedtime.    Dispense:  180 tablet    Refill:  3   allopurinol (ZYLOPRIM) 300 MG tablet    Sig: Take 1 tablet (300 mg total) by mouth every evening.    Dispense:  90 tablet    Refill:  3     Orders Placed This Encounter  Procedures   Microscopic Examination   DG Abd 1 View    Standing Status:   Future    Standing Expiration Date:   12/12/2023    Order Specific Question:   Reason for Exam (SYMPTOM  OR DIAGNOSIS REQUIRED)    Answer:   renal stones    Order Specific Question:   Preferred imaging location?    Answer:   [facility]    Order Specific Question:   Radiology Contrast Protocol - do NOT remove file path    Answer:   [email or web address]   Urinalysis, Routine w reflex microscopic      Return in about 6 months (around 06/14/2023) for with KUB.    CC: Assunta Found, MD and Dr. Celso Amy.    Bjorn Pippin 12/13/2022 Patient ID: Paralee Cancel, female   DOB: 01-05-1951, 72 y.o.   MRN: [id number]

## 2022-12-27 DIAGNOSIS — H401134 Primary open-angle glaucoma, bilateral, indeterminate stage: Secondary | ICD-10-CM | POA: Diagnosis not present

## 2023-01-10 DIAGNOSIS — R809 Proteinuria, unspecified: Secondary | ICD-10-CM | POA: Diagnosis not present

## 2023-01-10 DIAGNOSIS — E559 Vitamin D deficiency, unspecified: Secondary | ICD-10-CM | POA: Diagnosis not present

## 2023-01-10 DIAGNOSIS — R82991 Hypocitraturia: Secondary | ICD-10-CM | POA: Diagnosis not present

## 2023-01-10 DIAGNOSIS — Z79899 Other long term (current) drug therapy: Secondary | ICD-10-CM | POA: Diagnosis not present

## 2023-01-10 DIAGNOSIS — E1122 Type 2 diabetes mellitus with diabetic chronic kidney disease: Secondary | ICD-10-CM | POA: Diagnosis not present

## 2023-01-10 DIAGNOSIS — E870 Hyperosmolality and hypernatremia: Secondary | ICD-10-CM | POA: Diagnosis not present

## 2023-01-10 DIAGNOSIS — N189 Chronic kidney disease, unspecified: Secondary | ICD-10-CM | POA: Diagnosis not present

## 2023-01-14 DIAGNOSIS — E538 Deficiency of other specified B group vitamins: Secondary | ICD-10-CM | POA: Diagnosis not present

## 2023-01-16 DIAGNOSIS — N200 Calculus of kidney: Secondary | ICD-10-CM | POA: Diagnosis not present

## 2023-01-16 DIAGNOSIS — E1122 Type 2 diabetes mellitus with diabetic chronic kidney disease: Secondary | ICD-10-CM | POA: Diagnosis not present

## 2023-01-16 DIAGNOSIS — R82991 Hypocitraturia: Secondary | ICD-10-CM | POA: Diagnosis not present

## 2023-01-16 DIAGNOSIS — R809 Proteinuria, unspecified: Secondary | ICD-10-CM | POA: Diagnosis not present

## 2023-01-20 DIAGNOSIS — H401134 Primary open-angle glaucoma, bilateral, indeterminate stage: Secondary | ICD-10-CM | POA: Diagnosis not present

## 2023-02-11 DIAGNOSIS — E538 Deficiency of other specified B group vitamins: Secondary | ICD-10-CM | POA: Diagnosis not present

## 2023-02-11 DIAGNOSIS — Z23 Encounter for immunization: Secondary | ICD-10-CM | POA: Diagnosis not present

## 2023-03-07 DIAGNOSIS — Z6830 Body mass index (BMI) 30.0-30.9, adult: Secondary | ICD-10-CM | POA: Diagnosis not present

## 2023-03-07 DIAGNOSIS — I10 Essential (primary) hypertension: Secondary | ICD-10-CM | POA: Diagnosis not present

## 2023-03-07 DIAGNOSIS — N1832 Chronic kidney disease, stage 3b: Secondary | ICD-10-CM | POA: Diagnosis not present

## 2023-03-07 DIAGNOSIS — E1159 Type 2 diabetes mellitus with other circulatory complications: Secondary | ICD-10-CM | POA: Diagnosis not present

## 2023-03-07 DIAGNOSIS — E6609 Other obesity due to excess calories: Secondary | ICD-10-CM | POA: Diagnosis not present

## 2023-03-13 DIAGNOSIS — E782 Mixed hyperlipidemia: Secondary | ICD-10-CM | POA: Diagnosis not present

## 2023-03-13 DIAGNOSIS — I129 Hypertensive chronic kidney disease with stage 1 through stage 4 chronic kidney disease, or unspecified chronic kidney disease: Secondary | ICD-10-CM | POA: Diagnosis not present

## 2023-03-13 DIAGNOSIS — I10 Essential (primary) hypertension: Secondary | ICD-10-CM | POA: Diagnosis not present

## 2023-03-13 DIAGNOSIS — E1122 Type 2 diabetes mellitus with diabetic chronic kidney disease: Secondary | ICD-10-CM | POA: Diagnosis not present

## 2023-03-14 DIAGNOSIS — E538 Deficiency of other specified B group vitamins: Secondary | ICD-10-CM | POA: Diagnosis not present

## 2023-03-20 ENCOUNTER — Other Ambulatory Visit (HOSPITAL_COMMUNITY): Payer: Self-pay | Admitting: Family Medicine

## 2023-03-20 DIAGNOSIS — Z1231 Encounter for screening mammogram for malignant neoplasm of breast: Secondary | ICD-10-CM

## 2023-03-26 ENCOUNTER — Ambulatory Visit (HOSPITAL_COMMUNITY): Payer: PPO

## 2023-03-31 ENCOUNTER — Ambulatory Visit
Admission: RE | Admit: 2023-03-31 | Discharge: 2023-03-31 | Disposition: A | Discharge status: Home / Self Care | Payer: PPO | Source: Ambulatory Visit | Attending: Family Medicine | Admitting: Family Medicine

## 2023-03-31 DIAGNOSIS — Z1231 Encounter for screening mammogram for malignant neoplasm of breast: Secondary | ICD-10-CM

## 2023-04-06 ENCOUNTER — Emergency Department (HOSPITAL_COMMUNITY)
Admission: EM | Admit: 2023-04-06 | Discharge: 2023-04-06 | Disposition: A | Discharge status: Home / Self Care | Payer: PPO | Attending: Emergency Medicine | Admitting: Emergency Medicine

## 2023-04-06 ENCOUNTER — Other Ambulatory Visit: Payer: Self-pay

## 2023-04-06 ENCOUNTER — Encounter (HOSPITAL_COMMUNITY): Payer: Self-pay

## 2023-04-06 DIAGNOSIS — E119 Type 2 diabetes mellitus without complications: Secondary | ICD-10-CM | POA: Diagnosis not present

## 2023-04-06 DIAGNOSIS — T783XXA Angioneurotic edema, initial encounter: Secondary | ICD-10-CM | POA: Diagnosis not present

## 2023-04-06 DIAGNOSIS — K130 Diseases of lips: Secondary | ICD-10-CM | POA: Diagnosis not present

## 2023-04-06 DIAGNOSIS — Z7984 Long term (current) use of oral hypoglycemic drugs: Secondary | ICD-10-CM | POA: Diagnosis not present

## 2023-04-06 DIAGNOSIS — I10 Essential (primary) hypertension: Secondary | ICD-10-CM | POA: Diagnosis not present

## 2023-04-06 DIAGNOSIS — Z79899 Other long term (current) drug therapy: Secondary | ICD-10-CM | POA: Diagnosis not present

## 2023-04-06 DIAGNOSIS — T464X5A Adverse effect of angiotensin-converting-enzyme inhibitors, initial encounter: Secondary | ICD-10-CM | POA: Diagnosis not present

## 2023-04-06 MED ORDER — DIPHENHYDRAMINE HCL 25 MG PO CAPS
25.0000 mg | ORAL_CAPSULE | Freq: Once | ORAL | Status: AC
  Administered 2023-04-06: 25 mg via ORAL
  Filled 2023-04-06: qty 1

## 2023-04-06 MED ORDER — PREDNISONE 10 MG PO TABS: 20.0000 mg | ORAL_TABLET | Freq: Two times a day (BID) | ORAL | 0 refills | Status: DC

## 2023-04-06 MED ORDER — PREDNISONE 20 MG PO TABS
20.0000 mg | ORAL_TABLET | Freq: Once | ORAL | Status: AC
  Administered 2023-04-06: 20 mg via ORAL
  Filled 2023-04-06: qty 1

## 2023-04-06 NOTE — Discharge Instructions (Signed)
Stop taking lisinopril.  Begin taking prednisone as prescribed.  Begin taking Benadryl 25 mg every 6 hours while awake for the next 3 days.  Return to the ER if you develop worsening swelling, difficulty breathing or swallowing, or for other new and concerning symptoms.

## 2023-04-06 NOTE — ED Notes (Signed)
Swelling has not spread and has slightly improved. MD assessed patient and agrees she is okay to be discharged. Patient instructed on when to return to ER for worsening symptoms.

## 2023-04-06 NOTE — ED Triage Notes (Signed)
Pt arrived via POV c/o lower lip swelling. Pt reports she woke up and noticed swelling to bottom lip and endorses mild itching. Pt denies any insect bites, no allergies to anything she is aware of and denies eating anything new or trying new detergents.

## 2023-04-06 NOTE — ED Notes (Signed)
Patient reports it feeling like her upper lip is now swelling. Swelling noted to upper lip. MD made aware. Patient pending discharge to observe swelling

## 2023-04-06 NOTE — ED Provider Notes (Signed)
[facility] EMERGENC[facility] DEPARTMENT AT [facility] Provider Note   CSN: [id number] Arrival date & time: 04/06/23  4098     History  Chief Complaint  Patient presents with   Oral Swelling    Gwendolyn Snyder is a 72 y.o. female.  Patient is a 72 year old female with past medical history of hypertension, diabetes, hyperlipidemia.  Patient presenting today for evaluation of lip swelling.  She went to bed yesterday evening with no issues, then woke up this morning with a swollen lower lip.  Patient does take lisinopril and has been on this for the past 3 years.  She denies any injury or trauma.  She denies pain.  No difficulty breathing or swallowing.  The history is provided by the patient.       Home Medications Prior to Admission medications   Medication Sig Start Date End Date Taking? Authorizing Provider  acetaminophen (TYLENOL) 650 MG CR tablet Take 1,300 mg by mouth 2 (two) times daily.    [provider]  allopurinol (ZYLOPRIM) 300 MG tablet Take 1 tablet (300 mg total) by mouth every evening. 12/12/22   Bjorn Pippin, MD  amLODipine (NORVASC) 10 MG tablet Take 10 mg by mouth daily. 08/31/20   [provider]  amLODipine (NORVASC) 10 MG tablet Take 1 tablet by mouth daily. 07/30/21   [provider]  carvedilol (COREG) 3.125 MG tablet Take 6.25 mg by mouth 2 (two) times daily. 02/09/21   [provider]  cholecalciferol (VITAMIN D) 1000 units tablet Take 1,000 Units by mouth daily.    [provider]  Coenzyme Q10 (CO Q 10) 100 MG CAPS Take 100 mg by mouth daily.    [provider]  cyanocobalamin (,VITAMIN B-12,) 1000 MCG/ML injection Inject 1,000 mcg into the skin every 30 (thirty) days.    [provider]  ezetimibe (ZETIA) 10 MG tablet Take 10 mg by mouth daily.    [provider]  FARXIGA 5 MG TABS tablet Take 5 mg by mouth daily. 08/27/21   [provider]  FREESTYLE LITE test strip Use to  test blood sugar once daily 08/11/20   [provider]  Inositol Niacinate 500 MG CAPS Take 500 mg by mouth 2 (two) times daily.     [provider]  latanoprost (XALATAN) 0.005 % ophthalmic solution Place 1 drop into both eyes at bedtime. 10/05/20   [provider]  latanoprost (XALATAN) 0.005 % ophthalmic solution Apply to eye. 08/15/15   [provider]  niacin 500 MG tablet Take by mouth.    [provider]  omega-3 acid ethyl esters (LOVAZA) 1 g capsule Take 1 g by mouth 2 (two) times daily.  09/25/18   [provider]  ondansetron (ZOFRAN) 4 MG tablet Take 1 tablet (4 mg total) by mouth every 6 (six) hours as needed for nausea. 11/26/21   Sherryll Burger, Pratik D, DO  pantoprazole (PROTONIX) 40 MG tablet TAKE ONE TABLET BY MOUTH ONCE DAILY 07/30/21   Gelene Mink, NP  pantoprazole (PROTONIX) 40 MG tablet Take by mouth.    [provider]  Potassium Citrate 15 MEQ (1620 MG) TBCR Take 1 tablet by mouth in the morning and at bedtime. 12/12/22   Bjorn Pippin, MD  rosuvastatin (CRESTOR) 5 MG tablet Take 5 mg by mouth at bedtime. 08/11/20   [provider]  sitaGLIPtin (JANUVIA) 100 MG tablet Take 1 tablet (100 mg total) by mouth daily.    [provider]  famotidine (PEPCID) 20 MG tablet Take 1 tablet (20 mg total) by mouth 2 (two) times daily. 09/20/20 09/21/20  Mancel Bale, MD  omeprazole (PRILOSEC) 20 MG capsule Take 1 capsule (20 mg total) by mouth daily. 08/28/20 09/20/20  Rancour, Jeannett Senior, MD      Allergies    Aspirin, Codeine, Flomax [tamsulosin hcl], and Pravastatin    Review of Systems   Review of Systems  All other systems reviewed and are negative.   Physical Exam Updated Vital Signs BP (!) 168/73 (BP Location: Left Arm)   Pulse 91   Temp 98.4 F (36.9 C) (Oral)   Resp 20   Ht 5\' 3"  (1.6 m)   Wt 80 kg   SpO2 97%   BMI 31.24 kg/m  Physical Exam Vitals and nursing note reviewed.  Constitutional:       Appearance: Normal appearance.  HENT:     Mouth/Throat:     Mouth: Mucous membranes are moist.     Comments: There is swelling of the lower lip.  There is no intraoral involvement.  The tongue is normal in size. Pulmonary:     Effort: Pulmonary effort is normal. No respiratory distress.     Breath sounds: No wheezing.  Skin:    General: Skin is warm and dry.  Neurological:     Mental Status: She is alert and oriented to person, place, and time.     ED Results / Procedures / Treatments   Labs (all labs ordered are listed, but only abnormal results are displayed) Labs Reviewed - No data to display  EKG None  Radiology No results found.  Procedures Procedures    Medications Ordered in ED Medications  predniSONE (DELTASONE) tablet 20 mg (has no administration in time range)    ED Course/ Medical Decision Making/ A&P  Patient presenting here with complaints of lower lip swelling the etiology of which I suspect is angioedema related to ACE inhibitor therapy.  She is having no intraoral involvement and appears clinically well.  I feel as though she can safely be discharged.  I have advised her to hold her lisinopril and will treat with a short course of prednisone and Benadryl.  To follow-up with her primary doctor and return if her swelling worsens.  Final Clinical Impression(s) / ED Diagnoses Final diagnoses:  None    Rx / DC Orders ED Discharge Orders     None         Geoffery Lyons, MD 04/06/23 [phone]

## 2023-04-12 DIAGNOSIS — E1159 Type 2 diabetes mellitus with other circulatory complications: Secondary | ICD-10-CM | POA: Diagnosis not present

## 2023-04-12 DIAGNOSIS — N1832 Chronic kidney disease, stage 3b: Secondary | ICD-10-CM | POA: Diagnosis not present

## 2023-04-12 DIAGNOSIS — I10 Essential (primary) hypertension: Secondary | ICD-10-CM | POA: Diagnosis not present

## 2023-04-14 DIAGNOSIS — E538 Deficiency of other specified B group vitamins: Secondary | ICD-10-CM | POA: Diagnosis not present

## 2023-04-21 DIAGNOSIS — H401134 Primary open-angle glaucoma, bilateral, indeterminate stage: Secondary | ICD-10-CM | POA: Diagnosis not present

## 2023-05-05 DIAGNOSIS — H401134 Primary open-angle glaucoma, bilateral, indeterminate stage: Secondary | ICD-10-CM | POA: Diagnosis not present

## 2023-05-13 DIAGNOSIS — N1832 Chronic kidney disease, stage 3b: Secondary | ICD-10-CM | POA: Diagnosis not present

## 2023-05-13 DIAGNOSIS — R82991 Hypocitraturia: Secondary | ICD-10-CM | POA: Diagnosis not present

## 2023-05-13 DIAGNOSIS — N200 Calculus of kidney: Secondary | ICD-10-CM | POA: Diagnosis not present

## 2023-05-13 DIAGNOSIS — E1122 Type 2 diabetes mellitus with diabetic chronic kidney disease: Secondary | ICD-10-CM | POA: Diagnosis not present

## 2023-05-13 DIAGNOSIS — I129 Hypertensive chronic kidney disease with stage 1 through stage 4 chronic kidney disease, or unspecified chronic kidney disease: Secondary | ICD-10-CM | POA: Diagnosis not present

## 2023-05-13 DIAGNOSIS — N189 Chronic kidney disease, unspecified: Secondary | ICD-10-CM | POA: Diagnosis not present

## 2023-05-13 DIAGNOSIS — E1129 Type 2 diabetes mellitus with other diabetic kidney complication: Secondary | ICD-10-CM | POA: Diagnosis not present

## 2023-05-13 DIAGNOSIS — R809 Proteinuria, unspecified: Secondary | ICD-10-CM | POA: Diagnosis not present

## 2023-05-22 DIAGNOSIS — E538 Deficiency of other specified B group vitamins: Secondary | ICD-10-CM | POA: Diagnosis not present

## 2023-05-26 DIAGNOSIS — H401113 Primary open-angle glaucoma, right eye, severe stage: Secondary | ICD-10-CM | POA: Diagnosis not present

## 2023-05-26 DIAGNOSIS — H401134 Primary open-angle glaucoma, bilateral, indeterminate stage: Secondary | ICD-10-CM | POA: Diagnosis not present

## 2023-05-30 DIAGNOSIS — H401113 Primary open-angle glaucoma, right eye, severe stage: Secondary | ICD-10-CM | POA: Diagnosis not present

## 2023-06-03 DIAGNOSIS — H401113 Primary open-angle glaucoma, right eye, severe stage: Secondary | ICD-10-CM | POA: Diagnosis not present

## 2023-06-11 DIAGNOSIS — H401113 Primary open-angle glaucoma, right eye, severe stage: Secondary | ICD-10-CM | POA: Diagnosis not present

## 2023-06-16 DIAGNOSIS — Z1331 Encounter for screening for depression: Secondary | ICD-10-CM | POA: Diagnosis not present

## 2023-06-16 DIAGNOSIS — Z6829 Body mass index (BMI) 29.0-29.9, adult: Secondary | ICD-10-CM | POA: Diagnosis not present

## 2023-06-16 DIAGNOSIS — E782 Mixed hyperlipidemia: Secondary | ICD-10-CM | POA: Diagnosis not present

## 2023-06-16 DIAGNOSIS — Z0001 Encounter for general adult medical examination with abnormal findings: Secondary | ICD-10-CM | POA: Diagnosis not present

## 2023-06-16 DIAGNOSIS — E538 Deficiency of other specified B group vitamins: Secondary | ICD-10-CM | POA: Diagnosis not present

## 2023-06-16 DIAGNOSIS — N1832 Chronic kidney disease, stage 3b: Secondary | ICD-10-CM | POA: Diagnosis not present

## 2023-06-16 DIAGNOSIS — E1159 Type 2 diabetes mellitus with other circulatory complications: Secondary | ICD-10-CM | POA: Diagnosis not present

## 2023-06-16 DIAGNOSIS — E663 Overweight: Secondary | ICD-10-CM | POA: Diagnosis not present

## 2023-06-16 DIAGNOSIS — E1129 Type 2 diabetes mellitus with other diabetic kidney complication: Secondary | ICD-10-CM | POA: Diagnosis not present

## 2023-06-16 DIAGNOSIS — J449 Chronic obstructive pulmonary disease, unspecified: Secondary | ICD-10-CM | POA: Diagnosis not present

## 2023-06-16 DIAGNOSIS — I10 Essential (primary) hypertension: Secondary | ICD-10-CM | POA: Diagnosis not present

## 2023-06-16 DIAGNOSIS — H409 Unspecified glaucoma: Secondary | ICD-10-CM | POA: Diagnosis not present

## 2023-06-17 DIAGNOSIS — E1159 Type 2 diabetes mellitus with other circulatory complications: Secondary | ICD-10-CM | POA: Diagnosis not present

## 2023-06-17 DIAGNOSIS — N1832 Chronic kidney disease, stage 3b: Secondary | ICD-10-CM | POA: Diagnosis not present

## 2023-06-17 DIAGNOSIS — I10 Essential (primary) hypertension: Secondary | ICD-10-CM | POA: Diagnosis not present

## 2023-06-19 ENCOUNTER — Ambulatory Visit: Payer: PPO | Admitting: Urology

## 2023-07-03 ENCOUNTER — Ambulatory Visit: Payer: PPO | Admitting: Urology

## 2023-07-16 DIAGNOSIS — E1129 Type 2 diabetes mellitus with other diabetic kidney complication: Secondary | ICD-10-CM | POA: Diagnosis not present

## 2023-07-16 DIAGNOSIS — I129 Hypertensive chronic kidney disease with stage 1 through stage 4 chronic kidney disease, or unspecified chronic kidney disease: Secondary | ICD-10-CM | POA: Diagnosis not present

## 2023-07-16 DIAGNOSIS — R809 Proteinuria, unspecified: Secondary | ICD-10-CM | POA: Diagnosis not present

## 2023-07-16 DIAGNOSIS — N1832 Chronic kidney disease, stage 3b: Secondary | ICD-10-CM | POA: Diagnosis not present

## 2023-07-21 DIAGNOSIS — E538 Deficiency of other specified B group vitamins: Secondary | ICD-10-CM | POA: Diagnosis not present

## 2023-08-12 DIAGNOSIS — E538 Deficiency of other specified B group vitamins: Secondary | ICD-10-CM | POA: Diagnosis not present

## 2023-08-12 DIAGNOSIS — R946 Abnormal results of thyroid function studies: Secondary | ICD-10-CM | POA: Diagnosis not present

## 2023-09-08 DIAGNOSIS — B351 Tinea unguium: Secondary | ICD-10-CM | POA: Diagnosis not present

## 2023-09-08 DIAGNOSIS — E1142 Type 2 diabetes mellitus with diabetic polyneuropathy: Secondary | ICD-10-CM | POA: Diagnosis not present

## 2023-09-12 DIAGNOSIS — E538 Deficiency of other specified B group vitamins: Secondary | ICD-10-CM | POA: Diagnosis not present

## 2023-09-12 DIAGNOSIS — Z0001 Encounter for general adult medical examination with abnormal findings: Secondary | ICD-10-CM | POA: Diagnosis not present

## 2023-10-13 DIAGNOSIS — E538 Deficiency of other specified B group vitamins: Secondary | ICD-10-CM | POA: Diagnosis not present

## 2023-10-16 ENCOUNTER — Ambulatory Visit (HOSPITAL_COMMUNITY)
Admission: RE | Admit: 2023-10-16 | Discharge: 2023-10-16 | Disposition: A | Discharge status: Home / Self Care | Source: Ambulatory Visit | Attending: Urology | Admitting: Urology

## 2023-10-16 DIAGNOSIS — N200 Calculus of kidney: Secondary | ICD-10-CM | POA: Insufficient documentation

## 2023-10-16 DIAGNOSIS — Z87442 Personal history of urinary calculi: Secondary | ICD-10-CM | POA: Diagnosis not present

## 2023-10-22 ENCOUNTER — Ambulatory Visit: Payer: Self-pay

## 2023-10-23 ENCOUNTER — Ambulatory Visit: Payer: PPO | Admitting: Urology

## 2023-10-23 VITALS — BP 160/81 | HR 96

## 2023-10-23 DIAGNOSIS — R3129 Other microscopic hematuria: Secondary | ICD-10-CM | POA: Diagnosis not present

## 2023-10-23 DIAGNOSIS — N200 Calculus of kidney: Secondary | ICD-10-CM

## 2023-10-23 DIAGNOSIS — N289 Disorder of kidney and ureter, unspecified: Secondary | ICD-10-CM | POA: Diagnosis not present

## 2023-10-23 DIAGNOSIS — Z87442 Personal history of urinary calculi: Secondary | ICD-10-CM | POA: Diagnosis not present

## 2023-10-23 DIAGNOSIS — M1030 Gout due to renal impairment, unspecified site: Secondary | ICD-10-CM

## 2023-10-23 MED ORDER — ALLOPURINOL 300 MG PO TABS: 300.0000 mg | ORAL_TABLET | Freq: Every evening | ORAL | 3 refills | Status: AC

## 2023-10-23 MED ORDER — POTASSIUM CITRATE ER 15 MEQ (1620 MG) PO TBCR: 1.0000 | EXTENDED_RELEASE_TABLET | Freq: Two times a day (BID) | ORAL | 3 refills | Status: AC

## 2023-10-23 NOTE — Progress Notes (Signed)
 Subjective:  Gwendolyn Snyder returns today in f/u from a second ESWL on 01/21/19 for her recurrent left renal stones.  The initial ESWL was in 7/20.  I reviewed the films and report from her recent CT with her.  There is significant residual stone burden.   She has had no flank pain or hematuria.   A KUB prior to this visit shows stable bilateral renal stones, left > right, and no ureteral stone.  UA is clear.    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 She remains on allopurinol  and potassium citrate . Her stones were 80% UA and 20% CaOx.   She had chemistries in 7/20 and her Calcium  was 10.7 and the Cr was 1.25.   She is due labs with Dr. Glady Laming in the near future.    06/07/21: Gwendolyn Snyder returns today in f/u for her history of stones.  KUB prior to this visit shows stable bilateral renal stones, left > right.  Last labs in Epic showed a Cr of 1.58.  She remains on allopurinol  and potassium citrate .  She had stomach trouble over the summer.  She has cut out mountain dew at the recommendation of nephrology and has had some improvement in her renal function.   Her Cr was 1.20 on 02/02/21 and 1.55 on 03/22/21.  Her potassium is 4.4 but she had been off of the potassium citrate  for a while but is back on it now. .  She has seen Dr. Glady Laming and there are some concerns about possible hyperthyroidism.  UA today has 11-30 WBC and many bacteria.  She has no hematuria, dysuria or flank pain.     06/06/22: Gwendolyn Snyder returns today in f/u for her history of stones.  She has had no pain or hematuria.  Her UA has a few WBC's.  She is on allopurinal 150mg  and potassium citrate  15 meq tid.  A KUB prior to this visit shows an increase in stone burden, particulary on the left.   12/12/22: Gwendolyn Snyder returns today in f/u for her history of stones.  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  She has no flank pain or hematuria.  She has no dysuria.   She has CKD 3b/4 and is followed by Dr. Carrolyn Clan.  Her UA is unremarkable.   She remains non potassium citrate  and allopurinol    10/23/23: Gwendolyn Snyder returns today in f/u. For her history of stones.  She remains on allopurinol  and Kcitrate. She has had no flank pain or hematuira.  She passed some stone debris last week.  The KUB prior to this visit shows stable right renal stone and there appears to be some change in distribution of the left renal stone with reduced material in the upper pole and increased in the lower pole. Her UA has 6-10 WBC and >30 RBC's with moderate bacteria.  She has no dysuria or malodorous urine.   She has CKD3 with a Cr of 1.8 in 2/24.     ROS:  ROS:  A complete review of systems was performed.  All systems are negative except for pertinent findings as noted.   Review of Systems  Constitutional:  Positive for malaise/fatigue.  Eyes:  Positive for blurred vision (right eye glaucoma issues).  Respiratory:  Positive for shortness of breath.   Musculoskeletal:  Positive for joint pain.    Allergies  Allergen Reactions   Aspirin  Other (See Comments)    Makes ears ring and heart beat fast   Codeine Other (See Comments)    Hard to wake up   Flomax  [Tamsulosin  Hcl] Nausea And Vomiting   Pravastatin      Myalgias     Outpatient Encounter Medications as of 10/23/2023  Medication Sig   acetaminophen  (TYLENOL ) 650 MG CR tablet Take 1,300 mg by mouth 2 (two) times daily.   allopurinol  (ZYLOPRIM ) 300 MG tablet Take 1 tablet (300 mg total) by mouth every evening.   amLODipine  (NORVASC ) 10 MG tablet Take 10 mg by mouth daily.   amLODipine  (NORVASC ) 10 MG tablet Take 1 tablet by mouth daily.   carvedilol  (COREG ) 3.125 MG tablet Take 6.25 mg by mouth 2 (two) times daily.   cholecalciferol  (VITAMIN D ) 1000 units tablet Take 1,000 Units by mouth daily.    Coenzyme Q10 (CO Q 10) 100 MG CAPS Take 100 mg by mouth daily.   cyanocobalamin (,VITAMIN B-12,) 1000 MCG/ML injection Inject 1,000 mcg into the skin every 30 (thirty) days.   ezetimibe  (ZETIA ) 10 MG tablet Take 10 mg by mouth daily.   FARXIGA 5 MG TABS tablet Take 5 mg by mouth daily.   FREESTYLE LITE test strip Use to test blood sugar once daily   Inositol Niacinate 500 MG CAPS Take 500 mg by mouth 2 (two) times daily.    latanoprost  (XALATAN ) 0.005 % ophthalmic solution Place 1 drop into both eyes at bedtime.   latanoprost  (XALATAN ) 0.005 % ophthalmic solution Apply to eye.   niacin  500 MG tablet Take by mouth.   omega-3 acid ethyl esters (LOVAZA ) 1 g capsule Take 1 g by mouth 2 (two) times daily.    ondansetron  (ZOFRAN ) 4 MG tablet Take 1 tablet (4 mg total) by mouth every 6 (six) hours as needed for nausea.   pantoprazole  (PROTONIX ) 40 MG tablet TAKE ONE TABLET BY MOUTH ONCE DAILY   pantoprazole  (PROTONIX ) 40 MG tablet Take by mouth.   Potassium Citrate  15 MEQ (1620 MG) TBCR Take 1 tablet by mouth in the morning and at bedtime.   predniSONE  (DELTASONE ) 10 MG tablet Take 2 tablets (20 mg total) by mouth 2 (two) times daily.   rosuvastatin  (CRESTOR ) 5 MG tablet Take 5 mg by mouth at bedtime.   sitaGLIPtin (JANUVIA) 100 MG tablet Take 1 tablet (100 mg total) by mouth daily.   [DISCONTINUED] allopurinol  (ZYLOPRIM ) 300 MG tablet Take 1 tablet (300 mg total) by mouth every evening.   [DISCONTINUED] famotidine  (PEPCID ) 20 MG tablet Take 1 tablet (20 mg total) by mouth 2 (two) times daily.   [DISCONTINUED] omeprazole  (PRILOSEC) 20 MG capsule Take 1 capsule (20 mg total) by mouth daily.   [DISCONTINUED] Potassium Citrate  15 MEQ (1620 MG) TBCR Take 1 tablet by mouth in the morning and at bedtime.   No facility-administered encounter medications on file as of 10/23/2023.    Past Medical History:  Diagnosis Date   Anemia    Anxiety    Arthritis    Bilateral carpal tunnel syndrome    right  hand- surgery , left hand has not and left hand goes to sleep    Cataracts, bilateral    Complication of anesthesia    once after surgery felt like she could not breathe- 11/09/15- surgery greater than 11 years ago   COPD (chronic obstructive  pulmonary disease) (HCC)    Diabetes mellitus without complication (HCC)    diet controlled , type II    Family history of adverse reaction to anesthesia    sister- confused   Glaucoma    History of hiatal hernia    History of kidney stones    Hypertension    Renal disorder    Shortness of breath dyspnea    with exertion    Past Surgical History:  Procedure Laterality Date   ABDOMINAL HYSTERECTOMY     BIOPSY  10/24/2020   Procedure: BIOPSY;  Surgeon: Vinetta Greening, DO;  Location: [facility];  Service: Endoscopy;;   CARPAL TUNNEL RELEASE Right    COLONOSCOPY     COLONOSCOPY N/A 01/05/2019   normal   CYSTOSCOPY/RETROGRADE/URETEROSCOPY/STONE EXTRACTION WITH BASKET Left 09/17/2016   Procedure: CYSTOSCOPY/RETROGRADE/URETEROSCOPY/STONE EXTRACTION WITH BASKET/ STENT REMOVAL;  Surgeon: Homero Luster, MD;  Location: [facility];  Service: Urology;  Laterality: Left;   ESOPHAGOGASTRODUODENOSCOPY (EGD) WITH PROPOFOL  N/A 10/24/2020   large hiatal hernia, gastritis s/p biopsy. Normal duodenum. Negative H.pylori.   EXTRACORPOREAL SHOCK WAVE LITHOTRIPSY Left 11/16/2018   Procedure: EXTRACORPOREAL SHOCK WAVE LITHOTRIPSY (ESWL);  Surgeon: Samson Croak, MD;  Location: [facility];  Service: Urology;  Laterality: Left;   EXTRACORPOREAL SHOCK WAVE LITHOTRIPSY Left 01/21/2019   Procedure: EXTRACORPOREAL SHOCK WAVE LITHOTRIPSY (ESWL);  Surgeon: Homero Luster, MD;  Location: [facility];  Service: Urology;  Laterality: Left;   IR URETERAL STENT LEFT NEW ACCESS W/O SEP NEPHROSTOMY CATH  08/20/2016   JOINT REPLACEMENT     NEPHROLITHOTOMY Left 08/20/2016   Procedure: LEFT NEPHROLITHOTOMY PERCUTANEOUS FIRST STAGE;  Surgeon: Homero Luster, MD;  Location: [facility];  Service:  Urology;  Laterality: Left;   NEPHROLITHOTOMY Left 08/22/2016   Procedure: LEFT NEPHROLITHOTOMY PERCUTANEOUS SECOND LOOK;  Surgeon: Homero Luster, MD;  Location: [facility];  Service: Urology;  Laterality: Left;   Removal kidney stone     TOTAL HIP ARTHROPLASTY Bilateral    TOTAL HIP REVISION Left 11/22/2015   Procedure: Revision Left Total Hip Arthroplasty, Poly and Celeste Cola Exchange;  Surgeon: Adah Acron, MD;  Location: [facility];  Service: Orthopedics;  Laterality: Left;   TOTAL KNEE ARTHROPLASTY Right     Social History   Socioeconomic History   Marital status: Single    Spouse name: Not on file   Number of children: Not on file   Years of education: Not on file   Highest education level: Not on file  Occupational History   Not on file  Tobacco Use   Smoking status: Former    Current packs/day: 0.00    Average packs/day: 1 pack/day for 30.0 years (30.0 ttl pk-yrs)    Types: Cigarettes    Start date: 05/13/1974    Quit date: 05/13/2004    Years since quitting: 19.4   Smokeless tobacco: Never  Vaping Use   Vaping status: Never Used  Substance and Sexual Activity   Alcohol use: No    Alcohol/week: 0.0 standard drinks of alcohol   Drug use: No   Sexual activity: Not on file  Other Topics Concern   Not on file  Social History Narrative   Not on file   Social Drivers of Health   Financial Resource Strain: Low Risk  (03/07/2022)   Overall Financial Resource Strain (CARDIA)    Difficulty of Paying Living Expenses: Not very hard  Food Insecurity: Not on file  Transportation Needs: No Transportation Needs (03/07/2022)   PRAPARE - Transportation  Lack of Transportation (Medical): No    Lack of Transportation (Non-Medical): No  Physical Activity: Not on file  Stress: Not on file  Social Connections: Not on file  Intimate Partner Violence: Not on file    Family History  Problem Relation Age of Onset   Colon cancer Neg Hx    Colon polyps Neg Hx    Breast cancer Neg Hx         Objective: Vitals:   10/23/23 1511  BP: (!) 160/81  Pulse: 96      Physical Exam Vitals reviewed.  Constitutional:      Appearance: Normal appearance.   Neurological:     Mental Status: She is alert.     Lab Results:  Results for orders placed or performed in visit on 10/23/23 (from the past 24 hours)  Urinalysis, Routine w reflex microscopic     Status: Abnormal   Collection Time: 10/23/23  3:12 PM  Result Value Ref Range   Specific Gravity, UA 1.030 1.005 - 1.030   pH, UA 5.5 5.0 - 7.5   Color, UA Yellow Yellow   Appearance Ur Clear Clear   Leukocytes,UA 1+ (A) Negative   Protein,UA 1+ (A) Negative/Trace   Glucose, UA Negative Negative   Ketones, UA Negative Negative   RBC, UA 3+ (A) Negative   Bilirubin, UA Negative Negative   Urobilinogen, Ur 0.2 0.2 - 1.0 mg/dL   Nitrite, UA Negative Negative   Microscopic Examination See below:    Narrative   Performed at:  [address] - [facility] [address] [place], [place]  [id number] Lab Director: Liliana Regulus MT, Phone:  [phone]  Microscopic Examination     Status: Abnormal   Collection Time: 10/23/23  3:12 PM   Urine  Result Value Ref Range   WBC, UA 6-10 (A) 0 - 5 /hpf   RBC, Urine >30 (A) 0 - 2 /hpf   Epithelial Cells (non renal) 0-10 0 - 10 /hpf   Casts Present (A) None seen /lpf   Cast Type Granular casts (A) N/A   Bacteria, UA Moderate (A) None seen/Few   Narrative   Performed at:  [address] - [facility] [address] [place], [place]  [id number] Lab Director: Liliana Regulus MT, Phone:  [phone]      UA is unremarkable.    I reviewed labs from Dr. Carrolyn Clan including her Cr.  BMET No results for input(s): NA, K, CL, CO2, GLUCOSE, BUN, CREATININE, CALCIUM  in the last 72 hours. PSA No results found for: PSA No results found for: TESTOSTERONE   Studies/Results: DG Abd 1 View Result Date: 10/21/2023 CLINICAL DATA:  History of renal stones EXAM: ABDOMEN  - 1 VIEW COMPARISON:  12/05/2022 FINDINGS: Scattered large and small bowel gas is noted. Staghorn type stones are noted within the left kidney. Scattered calculi are noted within the right kidney stable in appearance from the prior exam. No free air is seen. No obstructive changes are noted. IMPRESSION: Bilateral nonobstructing renal calculi stable from the prior CT. Electronically Signed   By: Violeta Grey M.D.   On: 10/21/2023 23:50    I have reviewed office notes and labs from Dr. Carrolyn Clan.  Recent Cr was 1.5.   Assessment & Plan: Bilateral renal stones with minimally  increased stone burden left > right.  She is asymptomatic but may have passed some stone material recently.  I will have her return for a KUB and renal US  in 6 months.   Continue allopurinol   and potassium citrate .  She will need a PCNL for management but would like to hold off for now.   History of uric acid stones with gouty nephropathy.     Pyuria and microhematuria today.  I will culture the urine.        Meds ordered this encounter  Medications   allopurinol  (ZYLOPRIM ) 300 MG tablet    Sig: Take 1 tablet (300 mg total) by mouth every evening.    Dispense:  90 tablet    Refill:  3   Potassium Citrate  15 MEQ (1620 MG) TBCR    Sig: Take 1 tablet by mouth in the morning and at bedtime.    Dispense:  180 tablet    Refill:  3     Orders Placed This Encounter  Procedures   Urine Culture   Microscopic Examination   Urinalysis, Routine w reflex microscopic   Ambulatory referral to Urology    Referral Priority:   Routine    Referral Type:   Consultation    Referral Reason:   Patient Preference    Referred to Provider:   Homero Luster, MD    Requested Specialty:   Urology    Number of Visits Requested:   1      Return in about 6 months (around 04/23/2024) for [place].    CC: Minus Amel, MD and Dr. Woodie Hazard.    Homero Luster 10/24/2023 Patient ID: Perfecto Bracket, female   DOB: [birth year], 73 y.o.    MRN: [id number]

## 2023-10-24 ENCOUNTER — Encounter: Payer: Self-pay | Admitting: Urology

## 2023-10-24 LAB — URINALYSIS, ROUTINE W REFLEX MICROSCOPIC
Bilirubin, UA: NEGATIVE
Glucose, UA: NEGATIVE
Ketones, UA: NEGATIVE
Nitrite, UA: NEGATIVE
Specific Gravity, UA: 1.03 (ref 1.005–1.030)
Urobilinogen, Ur: 0.2 mg/dL (ref 0.2–1.0)
pH, UA: 5.5 (ref 5.0–7.5)

## 2023-10-24 LAB — MICROSCOPIC EXAMINATION: RBC, Urine: >30 /HPF — AB (ref 0–2)

## 2023-10-31 ENCOUNTER — Ambulatory Visit: Payer: Self-pay

## 2023-10-31 DIAGNOSIS — R3129 Other microscopic hematuria: Secondary | ICD-10-CM

## 2023-10-31 LAB — URINE CULTURE

## 2023-10-31 NOTE — Telephone Encounter (Signed)
-----   Message from Homero Luster sent at 10/31/2023 10:14 AM EDT ----- She has small counts of two bacteria with different antibiotic sensitivities.  This is very likely a vaginal contaminant but it would be worthwhile to get a catheterized specimen to confirm, but  since she was asymptomatic, if she is reluctant to have a catheterized specimen, we could just repeat a clean cath UA and culture in a couple of weeks.   I would rather not have to give her 2  different antibiotics if that can be avoided.  ----- Message ----- From: Dyke Glasser, CMA Sent: 10/31/2023   8:16 AM EDT To: Homero Luster, MD  Please review. ----- Message ----- From: Garner Jury Lab Results In Sent: 10/24/2023   5:36 AM EDT To: [facility]

## 2023-10-31 NOTE — Telephone Encounter (Signed)
 Called pt to give results from MD pt stated she rather repeat UA in 2 weeks appt w/ lab was scheduled

## 2023-11-11 DIAGNOSIS — R809 Proteinuria, unspecified: Secondary | ICD-10-CM | POA: Diagnosis not present

## 2023-11-11 DIAGNOSIS — N189 Chronic kidney disease, unspecified: Secondary | ICD-10-CM | POA: Diagnosis not present

## 2023-11-11 DIAGNOSIS — E538 Deficiency of other specified B group vitamins: Secondary | ICD-10-CM | POA: Diagnosis not present

## 2023-11-11 DIAGNOSIS — N2581 Secondary hyperparathyroidism of renal origin: Secondary | ICD-10-CM | POA: Diagnosis not present

## 2023-11-11 DIAGNOSIS — E211 Secondary hyperparathyroidism, not elsewhere classified: Secondary | ICD-10-CM | POA: Diagnosis not present

## 2023-11-11 DIAGNOSIS — D631 Anemia in chronic kidney disease: Secondary | ICD-10-CM | POA: Diagnosis not present

## 2023-11-13 ENCOUNTER — Other Ambulatory Visit

## 2023-11-13 ENCOUNTER — Other Ambulatory Visit: Payer: Self-pay

## 2023-11-13 ENCOUNTER — Ambulatory Visit: Payer: Self-pay

## 2023-11-13 DIAGNOSIS — R3129 Other microscopic hematuria: Secondary | ICD-10-CM

## 2023-11-13 DIAGNOSIS — N200 Calculus of kidney: Secondary | ICD-10-CM

## 2023-11-13 LAB — URINALYSIS, ROUTINE W REFLEX MICROSCOPIC
Bilirubin, UA: NEGATIVE
Glucose, UA: NEGATIVE
Ketones, UA: NEGATIVE
Nitrite, UA: NEGATIVE
Specific Gravity, UA: 1.025 (ref 1.005–1.030)
Urobilinogen, Ur: 0.2 mg/dL (ref 0.2–1.0)
pH, UA: 6 (ref 5.0–7.5)

## 2023-11-13 LAB — MICROSCOPIC EXAMINATION: WBC, UA: >30 /HPF — AB (ref 0–5)

## 2023-11-13 NOTE — Progress Notes (Signed)
 error

## 2023-11-15 LAB — URINE CULTURE

## 2023-11-17 DIAGNOSIS — E1142 Type 2 diabetes mellitus with diabetic polyneuropathy: Secondary | ICD-10-CM | POA: Diagnosis not present

## 2023-11-17 DIAGNOSIS — B351 Tinea unguium: Secondary | ICD-10-CM | POA: Diagnosis not present

## 2023-11-21 DIAGNOSIS — E1122 Type 2 diabetes mellitus with diabetic chronic kidney disease: Secondary | ICD-10-CM | POA: Diagnosis not present

## 2023-11-21 DIAGNOSIS — R82991 Hypocitraturia: Secondary | ICD-10-CM | POA: Diagnosis not present

## 2023-11-21 DIAGNOSIS — R809 Proteinuria, unspecified: Secondary | ICD-10-CM | POA: Diagnosis not present

## 2023-11-21 DIAGNOSIS — N200 Calculus of kidney: Secondary | ICD-10-CM | POA: Diagnosis not present

## 2023-12-03 DIAGNOSIS — H401113 Primary open-angle glaucoma, right eye, severe stage: Secondary | ICD-10-CM | POA: Diagnosis not present

## 2023-12-12 DIAGNOSIS — E538 Deficiency of other specified B group vitamins: Secondary | ICD-10-CM | POA: Diagnosis not present

## 2024-01-13 DIAGNOSIS — E538 Deficiency of other specified B group vitamins: Secondary | ICD-10-CM | POA: Diagnosis not present

## 2024-02-02 DIAGNOSIS — E1142 Type 2 diabetes mellitus with diabetic polyneuropathy: Secondary | ICD-10-CM | POA: Diagnosis not present

## 2024-02-02 DIAGNOSIS — B351 Tinea unguium: Secondary | ICD-10-CM | POA: Diagnosis not present

## 2024-02-18 DIAGNOSIS — E785 Hyperlipidemia, unspecified: Secondary | ICD-10-CM | POA: Diagnosis not present

## 2024-02-18 DIAGNOSIS — I10 Essential (primary) hypertension: Secondary | ICD-10-CM | POA: Diagnosis not present

## 2024-02-18 DIAGNOSIS — N1832 Chronic kidney disease, stage 3b: Secondary | ICD-10-CM | POA: Diagnosis not present

## 2024-02-18 DIAGNOSIS — Z23 Encounter for immunization: Secondary | ICD-10-CM | POA: Diagnosis not present

## 2024-02-18 DIAGNOSIS — R946 Abnormal results of thyroid function studies: Secondary | ICD-10-CM | POA: Diagnosis not present

## 2024-02-18 DIAGNOSIS — D649 Anemia, unspecified: Secondary | ICD-10-CM | POA: Diagnosis not present

## 2024-02-18 DIAGNOSIS — E119 Type 2 diabetes mellitus without complications: Secondary | ICD-10-CM | POA: Diagnosis not present

## 2024-02-18 DIAGNOSIS — E538 Deficiency of other specified B group vitamins: Secondary | ICD-10-CM | POA: Diagnosis not present

## 2024-03-04 DIAGNOSIS — H401113 Primary open-angle glaucoma, right eye, severe stage: Secondary | ICD-10-CM | POA: Diagnosis not present

## 2024-03-16 DIAGNOSIS — N189 Chronic kidney disease, unspecified: Secondary | ICD-10-CM | POA: Diagnosis not present

## 2024-03-16 DIAGNOSIS — E559 Vitamin D deficiency, unspecified: Secondary | ICD-10-CM | POA: Diagnosis not present

## 2024-03-16 DIAGNOSIS — E211 Secondary hyperparathyroidism, not elsewhere classified: Secondary | ICD-10-CM | POA: Diagnosis not present

## 2024-03-16 DIAGNOSIS — R809 Proteinuria, unspecified: Secondary | ICD-10-CM | POA: Diagnosis not present

## 2024-03-16 DIAGNOSIS — D631 Anemia in chronic kidney disease: Secondary | ICD-10-CM | POA: Diagnosis not present

## 2024-03-23 DIAGNOSIS — I129 Hypertensive chronic kidney disease with stage 1 through stage 4 chronic kidney disease, or unspecified chronic kidney disease: Secondary | ICD-10-CM | POA: Diagnosis not present

## 2024-03-23 DIAGNOSIS — N1832 Chronic kidney disease, stage 3b: Secondary | ICD-10-CM | POA: Diagnosis not present

## 2024-03-23 DIAGNOSIS — R809 Proteinuria, unspecified: Secondary | ICD-10-CM | POA: Diagnosis not present

## 2024-03-23 DIAGNOSIS — E1122 Type 2 diabetes mellitus with diabetic chronic kidney disease: Secondary | ICD-10-CM | POA: Diagnosis not present

## 2024-03-31 ENCOUNTER — Other Ambulatory Visit: Payer: Self-pay

## 2024-03-31 ENCOUNTER — Emergency Department (HOSPITAL_COMMUNITY)

## 2024-03-31 ENCOUNTER — Encounter (HOSPITAL_COMMUNITY): Payer: Self-pay | Admitting: *Deleted

## 2024-03-31 ENCOUNTER — Emergency Department (HOSPITAL_COMMUNITY)
Admission: EM | Admit: 2024-03-31 | Discharge: 2024-03-31 | Disposition: A | Discharge status: Home / Self Care | Attending: Emergency Medicine | Admitting: Emergency Medicine

## 2024-03-31 DIAGNOSIS — N23 Unspecified renal colic: Secondary | ICD-10-CM | POA: Diagnosis not present

## 2024-03-31 DIAGNOSIS — K449 Diaphragmatic hernia without obstruction or gangrene: Secondary | ICD-10-CM | POA: Diagnosis not present

## 2024-03-31 DIAGNOSIS — R160 Hepatomegaly, not elsewhere classified: Secondary | ICD-10-CM | POA: Diagnosis not present

## 2024-03-31 DIAGNOSIS — N132 Hydronephrosis with renal and ureteral calculous obstruction: Secondary | ICD-10-CM | POA: Insufficient documentation

## 2024-03-31 DIAGNOSIS — R10A1 Flank pain, right side: Secondary | ICD-10-CM | POA: Diagnosis present

## 2024-03-31 LAB — COMPREHENSIVE METABOLIC PANEL WITH GFR
ALT: 8 U/L (ref 0–44)
AST: 17 U/L (ref 15–41)
Albumin: 4 g/dL (ref 3.5–5.0)
Alkaline Phosphatase: 95 U/L (ref 38–126)
Anion gap: 12 (ref 5–15)
BUN: 27 mg/dL — ABNORMAL HIGH (ref 8–23)
CO2: 29 mmol/L (ref 22–32)
Calcium: 9.9 mg/dL (ref 8.9–10.3)
Chloride: 101 mmol/L (ref 98–111)
Creatinine, Ser: 1.91 mg/dL — ABNORMAL HIGH (ref 0.44–1.00)
GFR, Estimated: 27 mL/min — ABNORMAL LOW (ref >60)
Glucose, Bld: 131 mg/dL — ABNORMAL HIGH (ref 70–99)
Potassium: 3.8 mmol/L (ref 3.5–5.1)
Sodium: 143 mmol/L (ref 135–145)
Total Bilirubin: 0.6 mg/dL (ref 0.0–1.2)
Total Protein: 7.1 g/dL (ref 6.5–8.1)

## 2024-03-31 LAB — CBC WITH DIFFERENTIAL/PLATELET
Abs Immature Granulocytes: 0.1 K/uL — ABNORMAL HIGH (ref 0.00–0.07)
Basophils Absolute: 0.1 K/uL (ref 0.0–0.1)
Basophils Relative: 0 %
Eosinophils Absolute: 0 K/uL (ref 0.0–0.5)
Eosinophils Relative: 0 %
HCT: 41.1 % (ref 36.0–46.0)
Hemoglobin: 13.1 g/dL (ref 12.0–15.0)
Immature Granulocytes: 1 %
Lymphocytes Relative: 9 %
Lymphs Abs: 1.9 K/uL (ref 0.7–4.0)
MCH: 29.2 pg (ref 26.0–34.0)
MCHC: 31.9 g/dL (ref 30.0–36.0)
MCV: 91.7 fL (ref 80.0–100.0)
Monocytes Absolute: 1.7 K/uL — ABNORMAL HIGH (ref 0.1–1.0)
Monocytes Relative: 8 %
Neutro Abs: 18.2 K/uL — ABNORMAL HIGH (ref 1.7–7.7)
Neutrophils Relative %: 82 %
Platelets: 244 K/uL (ref 150–400)
RBC: 4.48 MIL/uL (ref 3.87–5.11)
RDW: 14.2 % (ref 11.5–15.5)
WBC: 22 K/uL — ABNORMAL HIGH (ref 4.0–10.5)
nRBC: 0 % (ref 0.0–0.2)

## 2024-03-31 LAB — LIPASE, BLOOD: Lipase: 16 U/L (ref 11–51)

## 2024-03-31 LAB — URINALYSIS, ROUTINE W REFLEX MICROSCOPIC
Bacteria, UA: NONE SEEN
Bilirubin Urine: NEGATIVE
Glucose, UA: NEGATIVE mg/dL
Ketones, ur: NEGATIVE mg/dL
Nitrite: NEGATIVE
Protein, ur: 30 mg/dL — AB
RBC / HPF: >50 RBC/hpf (ref 0–5)
Specific Gravity, Urine: 1.014 (ref 1.005–1.030)
pH: 5 (ref 5.0–8.0)

## 2024-03-31 MED ORDER — ONDANSETRON 4 MG PO TBDP
4.0000 mg | ORAL_TABLET | Freq: Three times a day (TID) | ORAL | 0 refills | Status: AC | PRN
Start: 2024-03-31 — End: ?

## 2024-03-31 MED ORDER — OXYCODONE HCL 5 MG PO TABS: 5.0000 mg | ORAL_TABLET | Freq: Four times a day (QID) | ORAL | 0 refills | Status: DC | PRN

## 2024-03-31 NOTE — Discharge Instructions (Addendum)
 You are passing a 7 mm stone from your right kidney.  You will need follow-up with urologist for this.  The stone the size may not pass on its own.  I did prescribe you some pain and nausea medication to use as needed at home.  Continue drinking plenty of fluids.  Please call the urology office tomorrow.  If you start develop fevers, persistent vomiting, lightheadedness, uncontrollable shaking, please return to the emergency department.

## 2024-03-31 NOTE — ED Notes (Signed)
 Patient transported to CT

## 2024-03-31 NOTE — ED Triage Notes (Signed)
 Pt with rt flank pain since yesterday, pt with hx of kidney stones.  Denies blood in urine. Also c/o constipation-LBM 2 days ago.

## 2024-03-31 NOTE — ED Provider Notes (Signed)
 [facility] AT [facility] Provider Note   CSN: [id number] Arrival date & time: 03/31/24  1048     Patient presents with: Flank Pain   Gwendolyn Snyder is a 73 y.o. female here with right sided flank pain, onset last night, now migrated to RLQ.  Urinary pressure.   Hx of renal stones.  Minimal pain now, but colicky overall.  Nausea, no vomiting.  No BM in 2 days.  Hx of hysterectomy, no other abdominal surgeries.   HPI     Prior to Admission medications   Medication Sig Start Date End Date Taking? Authorizing Provider  ondansetron  (ZOFRAN -ODT) 4 MG disintegrating tablet Take 1 tablet (4 mg total) by mouth every 8 (eight) hours as needed for nausea or vomiting. 03/31/24  Yes Belvin Gauss, Donnice PARAS, MD  oxyCODONE  (ROXICODONE ) 5 MG immediate release tablet Take 1 tablet (5 mg total) by mouth every 6 (six) hours as needed for up to 12 doses for severe pain (pain score 7-10). 03/31/24  Yes Cottie Donnice PARAS, MD  acetaminophen  (TYLENOL ) 650 MG CR tablet Take 1,300 mg by mouth 2 (two) times daily.    [provider]  allopurinol  (ZYLOPRIM ) 300 MG tablet Take 1 tablet (300 mg total) by mouth every evening. 10/23/23   Watt Rush, MD  amLODipine  (NORVASC ) 10 MG tablet Take 10 mg by mouth daily. 08/31/20   [provider]  amLODipine  (NORVASC ) 10 MG tablet Take 1 tablet by mouth daily. 07/30/21   [provider]  carvedilol  (COREG ) 3.125 MG tablet Take 6.25 mg by mouth 2 (two) times daily. 02/09/21   [provider]  cholecalciferol  (VITAMIN D ) 1000 units tablet Take 1,000 Units by mouth daily.    [provider]  Coenzyme Q10 (CO Q 10) 100 MG CAPS Take 100 mg by mouth daily.    [provider]  cyanocobalamin (,VITAMIN B-12,) 1000 MCG/ML injection Inject 1,000 mcg into the skin every 30 (thirty) days.    [provider]  ezetimibe  (ZETIA ) 10 MG tablet Take 10 mg by mouth daily.    [provider]   FARXIGA 5 MG TABS tablet Take 5 mg by mouth daily. 08/27/21   [provider]  FREESTYLE LITE test strip Use to test blood sugar once daily 08/11/20   [provider]  Inositol Niacinate 500 MG CAPS Take 500 mg by mouth 2 (two) times daily.     [provider]  latanoprost  (XALATAN ) 0.005 % ophthalmic solution Place 1 drop into both eyes at bedtime. 10/05/20   [provider]  latanoprost  (XALATAN ) 0.005 % ophthalmic solution Apply to eye. 08/15/15   [provider]  niacin  500 MG tablet Take by mouth.    [provider]  omega-3 acid ethyl esters (LOVAZA ) 1 g capsule Take 1 g by mouth 2 (two) times daily.  09/25/18   [provider]  ondansetron  (ZOFRAN ) 4 MG tablet Take 1 tablet (4 mg total) by mouth every 6 (six) hours as needed for nausea. 11/26/21   Maree, Pratik D, DO  pantoprazole  (PROTONIX ) 40 MG tablet TAKE ONE TABLET BY MOUTH ONCE DAILY 07/30/21   Shirlean Therisa ORN, NP  pantoprazole  (PROTONIX ) 40 MG tablet Take by mouth.    [provider]  Potassium Citrate  15 MEQ (1620 MG) TBCR Take 1 tablet by mouth in the morning and at bedtime. 10/23/23   Watt Rush, MD  predniSONE  (DELTASONE ) 10 MG tablet Take 2 tablets (20 mg total) by  mouth 2 (two) times daily. 04/06/23   Geroldine Berg, MD  rosuvastatin  (CRESTOR ) 5 MG tablet Take 5 mg by mouth at bedtime. 08/11/20   [provider]  sitaGLIPtin (JANUVIA) 100 MG tablet Take 1 tablet (100 mg total) by mouth daily.    [provider]  famotidine  (PEPCID ) 20 MG tablet Take 1 tablet (20 mg total) by mouth 2 (two) times daily. 09/20/20 09/21/20  Lorriane Holmes, MD  omeprazole  (PRILOSEC) 20 MG capsule Take 1 capsule (20 mg total) by mouth daily. 08/28/20 09/20/20  Rancour, Garnette, MD    Allergies: Aspirin , Codeine, Flomax  [tamsulosin  hcl], and Pravastatin     Review of Systems  Updated Vital Signs BP (!) 152/88   Pulse (!) 104   Temp 98.3 F (36.8 C)   Resp 16   Ht 5' 3  (1.6 m)   Wt 76.2 kg   SpO2 95%   BMI 29.76 kg/m   Physical Exam Constitutional:      General: She is not in acute distress. HENT:     Head: Normocephalic and atraumatic.  Eyes:     Conjunctiva/sclera: Conjunctivae normal.     Pupils: Pupils are equal, round, and reactive to light.  Cardiovascular:     Rate and Rhythm: Normal rate and regular rhythm.  Pulmonary:     Effort: Pulmonary effort is normal. No respiratory distress.  Abdominal:     General: There is no distension.     Tenderness: There is abdominal tenderness. There is no guarding.  Skin:    General: Skin is warm and dry.  Neurological:     General: No focal deficit present.     Mental Status: She is alert. Mental status is at baseline.  Psychiatric:        Mood and Affect: Mood normal.        Behavior: Behavior normal.     (all labs ordered are listed, but only abnormal results are displayed) Labs Reviewed  URINALYSIS, ROUTINE W REFLEX MICROSCOPIC - Abnormal; Notable for the following components:      Result Value   Hgb urine dipstick LARGE (*)    Protein, ur 30 (*)    Leukocytes,Ua TRACE (*)    All other components within normal limits  COMPREHENSIVE METABOLIC PANEL WITH GFR - Abnormal; Notable for the following components:   Glucose, Bld 131 (*)    BUN 27 (*)    Creatinine, Ser 1.91 (*)    GFR, Estimated 27 (*)    All other components within normal limits  CBC WITH DIFFERENTIAL/PLATELET - Abnormal; Notable for the following components:   WBC 22.0 (*)    Neutro Abs 18.2 (*)    Monocytes Absolute 1.7 (*)    Abs Immature Granulocytes 0.10 (*)    All other components within normal limits  URINE CULTURE  LIPASE, BLOOD    EKG: None  Radiology: CT Renal Stone Study Result Date: 03/31/2024 CLINICAL DATA:  Abdominal/right flank pain.  Possible renal stone. EXAM: CT ABDOMEN AND PELVIS WITHOUT CONTRAST TECHNIQUE: Multidetector CT imaging of the abdomen and pelvis was performed following the standard  protocol without IV contrast. RADIATION DOSE REDUCTION: This exam was performed according to the departmental dose-optimization program which includes automated exposure control, adjustment of the mA and/or kV according to patient size and/or use of iterative reconstruction technique. COMPARISON:  CT abdomen/pelvis 12/05/2022, CT chest 07/07/2019 FINDINGS: Lower chest: Heart is normal size. Calcified plaque over the left main, left anterior descending and left lateral circumflex coronary arteries. Thoracic  aorta is normal caliber with moderate calcified plaque present. Possible small penetrating ulcer or pseudoaneurysm of the visualized descending thoracic aorta. Large hiatal hernia prominent to the right of midline unchanged. Visualized lung bases demonstrate atelectasis adjacent the large hiatal hernia in the right lower lobe and are otherwise clear. Hepatobiliary: Mild stable hepatomegaly. No concerning focal liver mass. Gallbladder and biliary tree are normal. Pancreas: Normal. Spleen: Stable 1.8 cm hypodensity over the spleen possibly a hemangioma. Adrenals/Urinary Tract: Adrenal glands are normal. Kidneys are normal size. Moderate bilateral nephrolithiasis. No evidence of left-sided hydronephrosis. Left ureter and bladder are normal. There is mild right-sided hydronephrosis with 7 mm stone over the proximal right ureter just distal to the UPJ. Very distal right ureter not well seen due to streak artifact from bilateral hip prostheses. Mild stranding of the right perinephric fat. Stranding of the fat adjacent the right ureter with minimal free fluid over the presacral space. Stomach/Bowel: Large hiatal hernia as previously described. Small bowel is normal. Appendix is normal. Colon is unremarkable. Vascular/Lymphatic: Moderate calcified plaque over the abdominal aorta which is normal caliber. No adenopathy. Reproductive: Status post hysterectomy. No adnexal masses. Other: No free peritoneal air.  Musculoskeletal: Bilateral total hip arthroplasties causing moderate streak artifact over the pelvis. Moderate degenerative changes of the spine. IMPRESSION: 1. 7 mm stone over the proximal right ureter just distal to the UPJ causing mild right-sided hydronephrosis. Moderate bilateral nephrolithiasis. 2. Large hiatal hernia prominent to the right of midline unchanged. 3. Stable 1.8 cm hypodensity over the spleen likely a hemangioma. 4. Aortic atherosclerosis. Atherosclerotic coronary artery disease. Possible small penetrating ulcer or pseudoaneurysm over the visualized descending thoracic aorta. Recommend follow-up CTA chest on elective basis for further evaluation. Aortic Atherosclerosis (ICD10-I70.0). Electronically Signed   By: Toribio Agreste M.D.   On: 03/31/2024 14:07     Procedures   Medications Ordered in the ED - No data to display  Clinical Course as of 03/31/24 1718  Wed Mar 31, 2024  1441 Asymptomatic now, no fevers, tolerating PO.  WBC is quite elevated, likely reactive to her stone.  UA with trace leuks only, but we'll send off a urine culture.  Clinically she is extremely well appearing and I do not believe she needs observation admission or emergency stenting; does not appear to have urosepsis.  However close return precautions were discussed.  She'll call her urologist Dr Watt tomorrow. [MT]    Clinical Course User Index [MT] Shameika Speelman, Donnice PARAS, MD                                 Medical Decision Making Amount and/or Complexity of Data Reviewed Labs: ordered. Radiology: ordered.  Risk Prescription drug management.   This patient presents to the ED with concern for flank pain, abdominal pain. This involves an extensive number of treatment options, and is a complaint that carries with it a high risk of complications and morbidity.  The differential diagnosis includes ureteral colic vs biliary disease vs other  I ordered and personally interpreted labs.  The pertinent results  include:  wbc elevated, CR mild elevation  I ordered imaging studies including ct renal study I independently visualized and interpreted imaging which showed proximal ureteral stone    I agree with the radiologist interpretation  The patient was maintained on a cardiac monitor.  I personally viewed and interpreted the cardiac monitored which showed an underlying rhythm of: mild sinus tachycardia  I have reviewed the patients home medicines and have made adjustments as needed  Test Considered: doubt AAA, mesenteric ischemia, sepsis, ACS, other life threatening conditions  Patient remains mild or asymptomatic in ED, not requiring medications here.  PRN meds prescribed for home.  Dispostion:  After consideration of the diagnostic results and the patients response to treatment, I feel that the patent would benefit from close outpatient follow up.      Final diagnoses:  Ureteral colic    ED Discharge Orders          Ordered    oxyCODONE  (ROXICODONE ) 5 MG immediate release tablet  Every 6 hours PRN        03/31/24 1528    ondansetron  (ZOFRAN -ODT) 4 MG disintegrating tablet  Every 8 hours PRN        03/31/24 1528               Cottie Donnice PARAS, MD 03/31/24 1720

## 2024-04-02 ENCOUNTER — Telehealth: Payer: Self-pay | Admitting: Urology

## 2024-04-02 LAB — URINE CULTURE

## 2024-04-02 NOTE — Telephone Encounter (Signed)
 Returned pt phone call pt states she spoke with someone at [facility] urology who advised her to call our office to see if we had a sooner appt she could have pt advised that we are booked out until may and she is currently a pt of [facility] in [place] pt stated she called [facility] again and they advised her that they would call her back with another option

## 2024-04-05 ENCOUNTER — Encounter (HOSPITAL_COMMUNITY)
Admission: AD | Disposition: A | Discharge status: Home / Self Care | Payer: Self-pay | Source: Home / Self Care | Attending: Urology

## 2024-04-05 ENCOUNTER — Other Ambulatory Visit: Payer: Self-pay | Admitting: Urology

## 2024-04-05 ENCOUNTER — Other Ambulatory Visit: Payer: Self-pay

## 2024-04-05 ENCOUNTER — Inpatient Hospital Stay (HOSPITAL_COMMUNITY)
Admission: AD | Admit: 2024-04-05 | Discharge: 2024-04-07 | DRG: 660 | Disposition: A | Discharge status: Home / Self Care | Attending: Urology | Admitting: Urology

## 2024-04-05 ENCOUNTER — Encounter (HOSPITAL_COMMUNITY): Payer: Self-pay | Admitting: Urology

## 2024-04-05 ENCOUNTER — Inpatient Hospital Stay (HOSPITAL_COMMUNITY)

## 2024-04-05 ENCOUNTER — Inpatient Hospital Stay (HOSPITAL_COMMUNITY): Admitting: Anesthesiology

## 2024-04-05 DIAGNOSIS — Z87891 Personal history of nicotine dependence: Secondary | ICD-10-CM

## 2024-04-05 DIAGNOSIS — I10 Essential (primary) hypertension: Secondary | ICD-10-CM

## 2024-04-05 DIAGNOSIS — N132 Hydronephrosis with renal and ureteral calculous obstruction: Secondary | ICD-10-CM | POA: Diagnosis not present

## 2024-04-05 DIAGNOSIS — E119 Type 2 diabetes mellitus without complications: Secondary | ICD-10-CM | POA: Diagnosis present

## 2024-04-05 DIAGNOSIS — N139 Obstructive and reflux uropathy, unspecified: Secondary | ICD-10-CM | POA: Diagnosis not present

## 2024-04-05 DIAGNOSIS — N201 Calculus of ureter: Secondary | ICD-10-CM | POA: Diagnosis not present

## 2024-04-05 DIAGNOSIS — Z79899 Other long term (current) drug therapy: Secondary | ICD-10-CM

## 2024-04-05 DIAGNOSIS — Z90710 Acquired absence of both cervix and uterus: Secondary | ICD-10-CM

## 2024-04-05 DIAGNOSIS — Z96651 Presence of right artificial knee joint: Secondary | ICD-10-CM | POA: Diagnosis present

## 2024-04-05 DIAGNOSIS — N202 Calculus of kidney with calculus of ureter: Principal | ICD-10-CM | POA: Diagnosis present

## 2024-04-05 DIAGNOSIS — Z96643 Presence of artificial hip joint, bilateral: Secondary | ICD-10-CM | POA: Diagnosis present

## 2024-04-05 DIAGNOSIS — N12 Tubulo-interstitial nephritis, not specified as acute or chronic: Principal | ICD-10-CM | POA: Diagnosis present

## 2024-04-05 DIAGNOSIS — Z7984 Long term (current) use of oral hypoglycemic drugs: Secondary | ICD-10-CM

## 2024-04-05 DIAGNOSIS — N179 Acute kidney failure, unspecified: Secondary | ICD-10-CM | POA: Diagnosis present

## 2024-04-05 DIAGNOSIS — J449 Chronic obstructive pulmonary disease, unspecified: Secondary | ICD-10-CM | POA: Diagnosis present

## 2024-04-05 DIAGNOSIS — Z87442 Personal history of urinary calculi: Secondary | ICD-10-CM

## 2024-04-05 DIAGNOSIS — N10 Acute tubulo-interstitial nephritis: Secondary | ICD-10-CM | POA: Diagnosis present

## 2024-04-05 HISTORY — PX: CYSTOSCOPY/URETEROSCOPY/HOLMIUM LASER/STENT PLACEMENT: SHX6546

## 2024-04-05 LAB — GLUCOSE, CAPILLARY
Glucose-Capillary: 109 mg/dL — ABNORMAL HIGH (ref 70–99)
Glucose-Capillary: 196 mg/dL — ABNORMAL HIGH (ref 70–99)

## 2024-04-05 SURGERY — CYSTOSCOPY/URETEROSCOPY/HOLMIUM LASER/STENT PLACEMENT
Anesthesia: General | Laterality: Right

## 2024-04-05 MED ORDER — NIACIN 100 MG PO TABS
100.0000 mg | ORAL_TABLET | Freq: Every day | ORAL | Status: DC
  Administered 2024-04-05 – 2024-04-06 (×2): 100 mg via ORAL
  Filled 2024-04-05 (×2): qty 1

## 2024-04-05 MED ORDER — ONDANSETRON HCL 4 MG/2ML IJ SOLN
INTRAMUSCULAR | Status: AC
  Filled 2024-04-05: qty 4

## 2024-04-05 MED ORDER — PHENYLEPHRINE HCL (PRESSORS) 10 MG/ML IV SOLN
INTRAVENOUS | Status: DC | PRN
  Administered 2024-04-05 (×2): 80 ug via INTRAVENOUS

## 2024-04-05 MED ORDER — ALLOPURINOL 300 MG PO TABS
300.0000 mg | ORAL_TABLET | Freq: Every evening | ORAL | Status: DC
  Administered 2024-04-05: 300 mg via ORAL
  Filled 2024-04-05: qty 1

## 2024-04-05 MED ORDER — PROPOFOL 10 MG/ML IV BOLUS
INTRAVENOUS | Status: DC | PRN
  Administered 2024-04-05: 110 mg via INTRAVENOUS

## 2024-04-05 MED ORDER — INSULIN ASPART 100 UNIT/ML IJ SOLN
0.0000 [IU] | Freq: Three times a day (TID) | INTRAMUSCULAR | Status: DC
  Administered 2024-04-06: 2 [IU] via SUBCUTANEOUS
  Administered 2024-04-06: 3 [IU] via SUBCUTANEOUS
  Administered 2024-04-06: 2 [IU] via SUBCUTANEOUS
  Filled 2024-04-05: qty 3
  Filled 2024-04-05 (×2): qty 2

## 2024-04-05 MED ORDER — ACETAMINOPHEN 10 MG/ML IV SOLN: 1000.0000 mg | Freq: Once | INTRAVENOUS | Status: DC | PRN

## 2024-04-05 MED ORDER — HYOSCYAMINE SULFATE 0.125 MG SL SUBL: 0.1250 mg | SUBLINGUAL_TABLET | SUBLINGUAL | Status: DC | PRN

## 2024-04-05 MED ORDER — AMISULPRIDE (ANTIEMETIC) 5 MG/2ML IV SOLN: 10.0000 mg | Freq: Once | INTRAVENOUS | Status: DC | PRN

## 2024-04-05 MED ORDER — DEXAMETHASONE SODIUM PHOSPHATE 4 MG/ML IJ SOLN
INTRAMUSCULAR | Status: DC | PRN
  Administered 2024-04-05: 4 mg via INTRAVENOUS

## 2024-04-05 MED ORDER — LIDOCAINE HCL (CARDIAC) PF 100 MG/5ML IV SOSY
PREFILLED_SYRINGE | INTRAVENOUS | Status: DC | PRN
  Administered 2024-04-05: 80 mg via INTRAVENOUS

## 2024-04-05 MED ORDER — ONDANSETRON HCL 4 MG/2ML IJ SOLN
INTRAMUSCULAR | Status: DC | PRN
  Administered 2024-04-05: 4 mg via INTRAVENOUS

## 2024-04-05 MED ORDER — LIDOCAINE HCL (PF) 2 % IJ SOLN
INTRAMUSCULAR | Status: AC
Start: 2024-04-05 — End: 2024-04-05
  Filled 2024-04-05: qty 5

## 2024-04-05 MED ORDER — OXYCODONE HCL 5 MG/5ML PO SOLN: 5.0000 mg | Freq: Once | ORAL | Status: DC | PRN

## 2024-04-05 MED ORDER — INSULIN ASPART 100 UNIT/ML IJ SOLN
4.0000 [IU] | Freq: Three times a day (TID) | INTRAMUSCULAR | Status: DC
  Administered 2024-04-06 – 2024-04-07 (×3): 4 [IU] via SUBCUTANEOUS
  Filled 2024-04-05 (×4): qty 4

## 2024-04-05 MED ORDER — EPHEDRINE 5 MG/ML INJ
INTRAVENOUS | Status: AC
  Filled 2024-04-05: qty 5

## 2024-04-05 MED ORDER — SODIUM CHLORIDE 0.9 % IV SOLN
1.0000 g | INTRAVENOUS | Status: DC
  Administered 2024-04-05 – 2024-04-06 (×2): 1 g via INTRAVENOUS
  Filled 2024-04-05 (×2): qty 10

## 2024-04-05 MED ORDER — FENTANYL CITRATE (PF) 100 MCG/2ML IJ SOLN
INTRAMUSCULAR | Status: DC | PRN
  Administered 2024-04-05: 50 ug via INTRAVENOUS
  Administered 2024-04-05 (×2): 25 ug via INTRAVENOUS

## 2024-04-05 MED ORDER — CEFAZOLIN SODIUM-DEXTROSE 2-4 GM/100ML-% IV SOLN
2.0000 g | INTRAVENOUS | Status: AC
  Administered 2024-04-05: 2 g via INTRAVENOUS
  Filled 2024-04-05: qty 100

## 2024-04-05 MED ORDER — PHENYLEPHRINE 80 MCG/ML (10ML) SYRINGE FOR IV PUSH (FOR BLOOD PRESSURE SUPPORT)
PREFILLED_SYRINGE | INTRAVENOUS | Status: AC
  Filled 2024-04-05: qty 10

## 2024-04-05 MED ORDER — DIPHENHYDRAMINE HCL 12.5 MG/5ML PO ELIX: 12.5000 mg | ORAL_SOLUTION | Freq: Four times a day (QID) | ORAL | Status: DC | PRN

## 2024-04-05 MED ORDER — DOCUSATE SODIUM 100 MG PO CAPS
100.0000 mg | ORAL_CAPSULE | Freq: Two times a day (BID) | ORAL | Status: DC
  Filled 2024-04-05 (×4): qty 1

## 2024-04-05 MED ORDER — DIPHENHYDRAMINE HCL 50 MG/ML IJ SOLN: 12.5000 mg | Freq: Four times a day (QID) | INTRAMUSCULAR | Status: DC | PRN

## 2024-04-05 MED ORDER — ONDANSETRON HCL 4 MG/2ML IJ SOLN: 4.0000 mg | Freq: Once | INTRAMUSCULAR | Status: DC | PRN

## 2024-04-05 MED ORDER — LIDOCAINE HCL (PF) 2 % IJ SOLN
INTRAMUSCULAR | Status: AC
  Filled 2024-04-05: qty 5

## 2024-04-05 MED ORDER — INOSITOL NIACINATE 500 MG PO CAPS: 500.0000 mg | ORAL_CAPSULE | Freq: Two times a day (BID) | ORAL | Status: DC

## 2024-04-05 MED ORDER — OXYCODONE HCL 5 MG PO TABS: 5.0000 mg | ORAL_TABLET | Freq: Once | ORAL | Status: DC | PRN

## 2024-04-05 MED ORDER — AMLODIPINE BESYLATE 10 MG PO TABS
10.0000 mg | ORAL_TABLET | Freq: Every day | ORAL | Status: DC
  Administered 2024-04-06 – 2024-04-07 (×2): 10 mg via ORAL
  Filled 2024-04-05 (×2): qty 1

## 2024-04-05 MED ORDER — OXYCODONE HCL 5 MG PO TABS: 5.0000 mg | ORAL_TABLET | ORAL | Status: DC | PRN

## 2024-04-05 MED ORDER — ONDANSETRON HCL 4 MG/2ML IJ SOLN: 4.0000 mg | INTRAMUSCULAR | Status: DC | PRN

## 2024-04-05 MED ORDER — CHLORHEXIDINE GLUCONATE 0.12 % MT SOLN
15.0000 mL | Freq: Once | OROMUCOSAL | Status: AC
  Administered 2024-04-05: 15 mL via OROMUCOSAL

## 2024-04-05 MED ORDER — HYDROMORPHONE HCL 1 MG/ML IJ SOLN: 0.5000 mg | INTRAMUSCULAR | Status: DC | PRN

## 2024-04-05 MED ORDER — LACTATED RINGERS IV SOLN: INTRAVENOUS | Status: DC

## 2024-04-05 MED ORDER — IOHEXOL 300 MG/ML  SOLN
INTRAMUSCULAR | Status: DC | PRN
  Administered 2024-04-05: 5 mL

## 2024-04-05 MED ORDER — SODIUM CHLORIDE 0.9 % IR SOLN
Status: DC | PRN
Start: 2024-04-05 — End: 2024-04-05
  Administered 2024-04-05: 3000 mL via INTRAVESICAL

## 2024-04-05 MED ORDER — PANTOPRAZOLE SODIUM 40 MG PO TBEC
40.0000 mg | DELAYED_RELEASE_TABLET | Freq: Every day | ORAL | Status: DC
  Administered 2024-04-06 – 2024-04-07 (×2): 40 mg via ORAL
  Filled 2024-04-05 (×2): qty 1

## 2024-04-05 MED ORDER — FENTANYL CITRATE (PF) 100 MCG/2ML IJ SOLN
INTRAMUSCULAR | Status: AC
  Filled 2024-04-05: qty 2

## 2024-04-05 MED ORDER — ROSUVASTATIN CALCIUM 5 MG PO TABS
5.0000 mg | ORAL_TABLET | Freq: Every day | ORAL | Status: DC
  Administered 2024-04-05 – 2024-04-06 (×2): 5 mg via ORAL
  Filled 2024-04-05 (×2): qty 1

## 2024-04-05 MED ORDER — PROPOFOL 10 MG/ML IV BOLUS
INTRAVENOUS | Status: AC
  Filled 2024-04-05: qty 20

## 2024-04-05 MED ORDER — FENTANYL CITRATE (PF) 50 MCG/ML IJ SOSY: 25.0000 ug | PREFILLED_SYRINGE | INTRAMUSCULAR | Status: DC | PRN

## 2024-04-05 MED ORDER — EZETIMIBE 10 MG PO TABS
10.0000 mg | ORAL_TABLET | Freq: Every day | ORAL | Status: DC
  Administered 2024-04-05 – 2024-04-07 (×3): 10 mg via ORAL
  Filled 2024-04-05 (×3): qty 1

## 2024-04-05 MED ORDER — ACETAMINOPHEN 325 MG PO TABS
650.0000 mg | ORAL_TABLET | ORAL | Status: DC | PRN
  Administered 2024-04-05: 650 mg via ORAL
  Filled 2024-04-05: qty 2

## 2024-04-05 MED ORDER — CARVEDILOL 6.25 MG PO TABS
6.2500 mg | ORAL_TABLET | Freq: Two times a day (BID) | ORAL | Status: DC
  Administered 2024-04-05 – 2024-04-07 (×4): 6.25 mg via ORAL
  Filled 2024-04-05 (×4): qty 1

## 2024-04-05 MED ORDER — INSULIN ASPART 100 UNIT/ML IJ SOLN: 0.0000 [IU] | Freq: Every day | INTRAMUSCULAR | Status: DC

## 2024-04-05 SURGICAL SUPPLY — 20 items
BAG URO CATCHER STRL LF (MISCELLANEOUS) ×1 IMPLANT
BASKET ZERO TIP NITINOL 2.4FR (BASKET) IMPLANT
CATH URETERAL DUAL LUMEN 10F (MISCELLANEOUS) IMPLANT
CATH URETL OPEN 5X70 (CATHETERS) IMPLANT
CLOTH BEACON ORANGE TIMEOUT ST (SAFETY) ×1 IMPLANT
EXTRACTOR STONE 1.7FRX115CM (UROLOGICAL SUPPLIES) IMPLANT
FIBER LASER MOSES 200 DFL (Laser) IMPLANT
FIBER LASER MOSES 365 DFL (Laser) IMPLANT
GLOVE BIO SURGEON STRL SZ8 (GLOVE) ×1 IMPLANT
GOWN STRL REUS W/ TWL XL LVL3 (GOWN DISPOSABLE) ×1 IMPLANT
GUIDEWIRE ANG ZIPWIRE 038X150 (WIRE) IMPLANT
GUIDEWIRE STR DUAL SENSOR (WIRE) ×1 IMPLANT
KIT TURNOVER KIT A (KITS) ×1 IMPLANT
MANIFOLD NEPTUNE II (INSTRUMENTS) ×1 IMPLANT
PACK CYSTO (CUSTOM PROCEDURE TRAY) ×1 IMPLANT
SHEATH NAVIGATOR HD 11/13X28 (SHEATH) IMPLANT
SHEATH NAVIGATOR HD 11/13X36 (SHEATH) ×1 IMPLANT
STENT URET 6FRX24 CONTOUR (STENTS) IMPLANT
TUBING CONNECTING 10 (TUBING) ×1 IMPLANT
TUBING UROLOGY SET (TUBING) ×1 IMPLANT

## 2024-04-05 NOTE — Anesthesia Procedure Notes (Signed)
 Procedure Name: LMA Insertion Date/Time: 04/05/2024 4:07 PM  Performed by: Vincenzo Show, CRNAPre-anesthesia Checklist: Patient identified, Emergency Drugs available, Suction available, Patient being monitored and Timeout performed Patient Re-evaluated:Patient Re-evaluated prior to induction Oxygen Delivery Method: Circle system utilized Preoxygenation: Pre-oxygenation with 100% oxygen Induction Type: IV induction Ventilation: Mask ventilation without difficulty LMA: LMA inserted LMA Size: 4.0 Tube size: 4.0 mm Number of attempts: 1 Placement Confirmation: positive ETCO2 and breath sounds checked- equal and bilateral Tube secured with: Tape Dental Injury: Teeth and Oropharynx as per pre-operative assessment  Comments: Easy pass LMA

## 2024-04-05 NOTE — Transfer of Care (Signed)
 Immediate Anesthesia Transfer of Care Note  Patient: Gwendolyn Snyder  Procedure(s) Performed: Procedure(s): CYSTOSCOPY/URETEROSCOPY/HOLMIUM LASER/STENT PLACEMENT (Right)  Patient Location: PACU  Anesthesia Type:General  Level of Consciousness:  sedated, patient cooperative and responds to stimulation  Airway & Oxygen Therapy:Patient Spontanous Breathing and Patient connected to face mask oxgen  Post-op Assessment:  Report given to PACU RN and Post -op Vital signs reviewed and stable  Post vital signs:  Reviewed and stable  Last Vitals:  Vitals:   04/05/24 1431  BP: (!) 144/71  Pulse: 83  Resp: 16  Temp: 37.1 C  SpO2: 94%    Complications: No apparent anesthesia complications

## 2024-04-05 NOTE — Anesthesia Preprocedure Evaluation (Addendum)
 Anesthesia Evaluation  Patient identified by MRN, date of birth, ID band Patient awake    Reviewed: Allergy & Precautions, NPO status , Patient's Chart, lab work & pertinent test results, reviewed documented beta blocker date and time   History of Anesthesia Complications Negative for: history of anesthetic complications  Airway Mallampati: II  TM Distance: >3 FB Neck ROM: Full    Dental  (+) Teeth Intact, Dental Advisory Given, Partial Lower   Pulmonary COPD, former smoker   breath sounds clear to auscultation       Cardiovascular hypertension, Pt. on medications  Rhythm:Regular Rate:Normal  Stress Test (2021):  The left ventricular ejection fraction is hyperdynamic (>65%).  Nuclear stress EF: 69%.  There was no ST segment deviation noted during stress.  No ischemia or infarction noted on perfusion imaging.  This is a low risk study.    Neuro/Psych  PSYCHIATRIC DISORDERS Anxiety        GI/Hepatic ,GERD  Medicated and Controlled,,  Endo/Other  diabetes, Well Controlled, Type 2, Oral Hypoglycemic Agents    Renal/GU Renal InsufficiencyRenal disease (Baseline Cr ~1.9)     Musculoskeletal  (+) Arthritis ,    Abdominal   Peds  Hematology  (+) Blood dyscrasia, anemia Hgb 13.1, Plts 244K (03/31/24)    Anesthesia Other Findings   Reproductive/Obstetrics                              Anesthesia Physical Anesthesia Plan  ASA: 3  Anesthesia Plan: General   Post-op Pain Management:    Induction: Intravenous  PONV Risk Score and Plan: 3 and Ondansetron , Dexamethasone  and Treatment may vary due to age or medical condition  Airway Management Planned: Oral ETT  Additional Equipment: None  Intra-op Plan:   Post-operative Plan: Extubation in OR  Informed Consent:      Dental advisory given  Plan Discussed with: CRNA  Anesthesia Plan Comments:          Anesthesia Quick  Evaluation

## 2024-04-05 NOTE — Op Note (Signed)
 Preoperative diagnosis: right obstructing ureteral stone Postoperative diagnosis: Concern for right obstructive pyelonephritis  Procedures performed: Cystoscopy, right retrograde pyelogram with interpretation, right ureteral stent placement  Surgeon: Dr. Steffan Pea  Findings: Normal urethra No abnormalities in the bladder Significant cloudy and purulent appearing urine protruding from the right ureteral orifice after wire placed decision made to place stent. 6 x 24 double-J stent in right ureter without strings  Right retrograde pyelogram interpretation: Distended renal pelvis no filling defects no contrast extravasation.    Specimens:right renal pelvis urine culture  Indication: Gwendolyn Snyder is a 73 y.o. patient with long history of stones treated by Dr. Watt,  she was scented to the ER last week with complaints of pain her white blood cell count was 22 she presented today with significant pain decision was made to proceed with ureteroscopy with laser lithotripsy.. After reviewing the management options for treatment, he elected to proceed with the above surgical procedure(s). We have discussed the potential benefits and risks of the procedure, side effects of the proposed treatment, the likelihood of the patient achieving the goals of the procedure, and any potential problems that might occur during the procedure or recuperation. Informed consent has been obtained.  Procedure in detail: Patient was consented prior to being brought back to the operating room. He was then brought back to the operating room placed on the table in supine position. General anesthesia was then induced and endotracheal tube inserted. This then placed in dorsolithotomy position and prepped and draped in the routine sterile fashion. A timeout was held.  Using a 22.5 French cystoscope with a 30  lens, I gently passed the scope into the patient's urethra and into the bladder under visual guidance. A 360  cystoscopic evaluation was performed with no mucosal abnormalities, no tumors, and no foreign bodies identified. I then passed a 0. 038 Sensor wire into the right ureteral orifice and into the right renal pelvis.  Upon entering the right renal pelvis with the wire a significant out of purulent cloudy debris admitted from the right ureteral orifice at this point I made the decision to place stent and abort the case.  I then slid a 5 French open-ended ureteral catheter over the wire and into the renal pelvis and removed the wire.   I then aspirated 10 cc of urine which was sent for culture. Next I injected 10 cc of contrast into the renal collecting system and performed a retrograde pyelogram. I then replaced the wire through the open-ended ureteral catheter and remove the catheter. I then slid a 24 cm x 6 French double-J ureteral stent over the wire and into the renal pelvis under fluoroscopic guidance. Once a nice curl was noted in the renal pelvis I advanced the distal end of the stent into the bladder before removing the wire completely. A final fluoroscopic image was obtained confirming the curl in the renal pelvis as well as a curl in the bladder.   Disposition: The patient returned to the PACU in stable condition.

## 2024-04-05 NOTE — Plan of Care (Signed)

## 2024-04-05 NOTE — H&P (Signed)
 H&P   Chief Complaint: Right ureteral stone  History of Present Illness: 73 year old female long history of kidney stones treated by Dr. Watt she is a right proximal ureteral stone currently having significant pain creatinine slightly elevated creatinine 1.9 WBC 22 will plan to treat today.  Her UA was normal.  She presents today with pain in the clinic due to concerns about the holidays we will plan to treat today.  Will plan for right ureteroscopy with laser lithotripsy.  Past Medical History:  Diagnosis Date   Anemia    Anxiety    Arthritis    Bilateral carpal tunnel syndrome    right hand- surgery , left hand has not and left hand goes to sleep    Cataracts, bilateral    Complication of anesthesia    once after surgery felt like she could not breathe- 11/09/15- surgery greater than 11 years ago   COPD (chronic obstructive pulmonary disease) (HCC)    Diabetes mellitus without complication (HCC)    diet controlled , type II    Family history of adverse reaction to anesthesia    sister- confused   Glaucoma    History of hiatal hernia    History of kidney stones    Hypertension    Renal disorder    Shortness of breath dyspnea    with exertion   Past Surgical History:  Procedure Laterality Date   ABDOMINAL HYSTERECTOMY     BIOPSY  10/24/2020   Procedure: BIOPSY;  Surgeon: Cindie Carlin POUR, DO;  Location: [facility];  Service: Endoscopy;;   CARPAL TUNNEL RELEASE Right    COLONOSCOPY     COLONOSCOPY N/A 01/05/2019   normal   CYSTOSCOPY/RETROGRADE/URETEROSCOPY/STONE EXTRACTION WITH BASKET Left 09/17/2016   Procedure: CYSTOSCOPY/RETROGRADE/URETEROSCOPY/STONE EXTRACTION WITH BASKET/ STENT REMOVAL;  Surgeon: Watt Rush, MD;  Location: [facility];  Service: Urology;  Laterality: Left;   ESOPHAGOGASTRODUODENOSCOPY (EGD) WITH PROPOFOL  N/A 10/24/2020   large hiatal hernia, gastritis s/p biopsy. Normal duodenum. Negative H.pylori.   EXTRACORPOREAL SHOCK WAVE LITHOTRIPSY Left  11/16/2018   Procedure: EXTRACORPOREAL SHOCK WAVE LITHOTRIPSY (ESWL);  Surgeon: Carolee Sherwood JONETTA DOUGLAS, MD;  Location: [facility];  Service: Urology;  Laterality: Left;   EXTRACORPOREAL SHOCK WAVE LITHOTRIPSY Left 01/21/2019   Procedure: EXTRACORPOREAL SHOCK WAVE LITHOTRIPSY (ESWL);  Surgeon: Watt Rush, MD;  Location: [facility];  Service: Urology;  Laterality: Left;   IR URETERAL STENT LEFT NEW ACCESS W/O SEP NEPHROSTOMY CATH  08/20/2016   JOINT REPLACEMENT     NEPHROLITHOTOMY Left 08/20/2016   Procedure: LEFT NEPHROLITHOTOMY PERCUTANEOUS FIRST STAGE;  Surgeon: Rush Watt, MD;  Location: [facility];  Service: Urology;  Laterality: Left;   NEPHROLITHOTOMY Left 08/22/2016   Procedure: LEFT NEPHROLITHOTOMY PERCUTANEOUS SECOND LOOK;  Surgeon: Rush Watt, MD;  Location: [facility];  Service: Urology;  Laterality: Left;   Removal kidney stone     TOTAL HIP ARTHROPLASTY Bilateral    TOTAL HIP REVISION Left 11/22/2015   Procedure: Revision Left Total Hip Arthroplasty, Poly and Mercer Exchange;  Surgeon: Oneil JAYSON Herald, MD;  Location: [facility];  Service: Orthopedics;  Laterality: Left;   TOTAL KNEE ARTHROPLASTY Right     Home Medications:  Medications Prior to Admission  Medication Sig Dispense Refill Last Dose/Taking   acetaminophen  (TYLENOL ) 650 MG CR tablet Take 1,300 mg by mouth 2 (two) times daily.   04/04/2024   allopurinol  (ZYLOPRIM ) 300 MG tablet Take 1 tablet (300 mg total) by mouth every evening. 90 tablet 3 04/04/2024   amLODipine  (NORVASC )  10 MG tablet Take 10 mg by mouth daily.   04/05/2024 at  8:30 AM   amLODipine  (NORVASC ) 10 MG tablet Take 1 tablet by mouth daily.   04/05/2024 at  8:30 AM   carvedilol  (COREG ) 3.125 MG tablet Take 6.25 mg by mouth 2 (two) times daily.   04/05/2024 at  8:30 AM   cholecalciferol  (VITAMIN D ) 1000 units tablet Take 1,000 Units by mouth daily.   Past Week   Coenzyme Q10 (CO Q 10) 100 MG CAPS Take 100 mg by mouth daily.   Past Week   cyanocobalamin (,VITAMIN B-12,) 1000 MCG/ML  injection Inject 1,000 mcg into the skin every 30 (thirty) days.   Past Week   ezetimibe  (ZETIA ) 10 MG tablet Take 10 mg by mouth daily.   04/04/2024   FARXIGA 5 MG TABS tablet Take 5 mg by mouth daily.   Past Week   Inositol Niacinate 500 MG CAPS Take 500 mg by mouth 2 (two) times daily.    04/05/2024 at  8:30 AM   latanoprost  (XALATAN ) 0.005 % ophthalmic solution Place 1 drop into both eyes at bedtime.   04/04/2024   latanoprost  (XALATAN ) 0.005 % ophthalmic solution Apply to eye.   04/05/2024 at  8:30 AM   niacin  500 MG tablet Take by mouth.   04/04/2024   omega-3 acid ethyl esters (LOVAZA ) 1 g capsule Take 1 g by mouth 2 (two) times daily.    Past Week   pantoprazole  (PROTONIX ) 40 MG tablet TAKE ONE TABLET BY MOUTH ONCE DAILY 90 tablet 3 04/05/2024 at  8:30 AM   Potassium Citrate  15 MEQ (1620 MG) TBCR Take 1 tablet by mouth in the morning and at bedtime. 180 tablet 3 04/05/2024 at  8:30 AM   predniSONE  (DELTASONE ) 10 MG tablet Take 2 tablets (20 mg total) by mouth 2 (two) times daily. 20 tablet 0 04/05/2024 at  8:30 AM   rosuvastatin  (CRESTOR ) 5 MG tablet Take 5 mg by mouth at bedtime.   04/04/2024   sitaGLIPtin (JANUVIA) 100 MG tablet Take 1 tablet (100 mg total) by mouth daily.   Past Week   FREESTYLE LITE test strip Use to test blood sugar once daily      ondansetron  (ZOFRAN ) 4 MG tablet Take 1 tablet (4 mg total) by mouth every 6 (six) hours as needed for nausea. 20 tablet 0 Unknown   ondansetron  (ZOFRAN -ODT) 4 MG disintegrating tablet Take 1 tablet (4 mg total) by mouth every 8 (eight) hours as needed for nausea or vomiting. 20 tablet 0 Unknown   oxyCODONE  (ROXICODONE ) 5 MG immediate release tablet Take 1 tablet (5 mg total) by mouth every 6 (six) hours as needed for up to 12 doses for severe pain (pain score 7-10). 12 tablet 0    pantoprazole  (PROTONIX ) 40 MG tablet Take by mouth.      Allergies:  Allergies  Allergen Reactions   Aspirin  Other (See Comments)    Makes ears ring and  heart beat fast   Codeine Other (See Comments)    Hard to wake up   Flomax  [Tamsulosin  Hcl] Nausea And Vomiting   Pravastatin      Myalgias     Family History  Problem Relation Age of Onset   Colon cancer Neg Hx    Colon polyps Neg Hx    Breast cancer Neg Hx    Social History:  reports that she quit smoking about 19 years ago. Her smoking use included cigarettes. She started smoking about 49 years  ago. She has a 30 pack-year smoking history. She has never used smokeless tobacco. She reports that she does not drink alcohol and does not use drugs.  ROS: A complete review of systems was performed.  All systems are negative except for pertinent findings as noted. ROS   Physical Exam:  Vital signs in last 24 hours: Temp:  [98.7 F (37.1 C)] 98.7 F (37.1 C) (11/24 1431) Pulse Rate:  [83] 83 (11/24 1431) Resp:  [16] 16 (11/24 1431) BP: (144)/(71) 144/71 (11/24 1431) SpO2:  [94 %] 94 % (11/24 1431) Weight:  [76.2 kg] 76.2 kg (11/24 1252) General: NAD Respiratory: normal WOB on RA Cards: RRR per monitor   Laboratory Data:  No results found for this or any previous visit (from the past 24 hours). Recent Results (from the past 240 hours)  Urine Culture     Status: Abnormal   Collection Time: 03/31/24  1:51 PM   Specimen: Urine, Clean Catch  Result Value Ref Range Status   Specimen Description   Final    URINE, CLEAN CATCH Performed at [facility], [address] [place], [place] [address]    Special Requests   Final    NONE Performed at [facility], [address] [place], [place] [address]    Culture MULTIPLE SPECIES PRESENT, SUGGEST RECOLLECTION (A)  Final   Report Status 04/02/2024 FINAL  Final   Creatinine: Recent Labs    03/31/24 1401  CREATININE 1.91*    Impression/Assessment:  73 year old female with proximal right ureteral stone nonobstructing left renal stones.  Patient in the office today with pain we will plan for right ureteroscopy with laser  lithotripsy.  We discussed risk benefits alternatives to ureteroscopy.  This included bleeding infection and damage to surrounding structures surrounding structures including ureter as well as urethra.  We discussed the need for stent postoperatively as well as the potential symptoms of stent placement.  We discussed possible inability to complete procedure due to caliber of your ureter or inability to pass stone possibly requiring long-term stent versus nephrostomy tube.  We discussed need for possible second surgery.  Patient voiced their understanding and consent was obtained.  Plan:  - proceed with left URS w/ LL   Steffan JAYSON Pea 04/05/2024, 3:38 PM

## 2024-04-05 NOTE — Anesthesia Postprocedure Evaluation (Signed)
 Anesthesia Post Note  Patient: [name]  Procedure(s) Performed: CYSTOSCOPY/STENT PLACEMENT (Right)     Patient location during evaluation: PACU Anesthesia Type: General Level of consciousness: awake and alert Pain management: pain level controlled Vital Signs Assessment: post-procedure vital signs reviewed and stable Respiratory status: spontaneous breathing, nonlabored ventilation, respiratory function stable and patient connected to nasal cannula oxygen Cardiovascular status: blood pressure returned to baseline and stable Postop Assessment: no apparent nausea or vomiting Anesthetic complications: no   No notable events documented.  Last Vitals:  Vitals:   04/05/24 1730 04/05/24 1751  BP: 136/68 (!) 149/75  Pulse: 74 87  Resp: (!) 27 20  Temp: (!) 36.1 C (!) 36.4 C  SpO2: 92% 93%    Last Pain:  Vitals:   04/05/24 1809  TempSrc:   PainSc: 0-No pain                 [name]

## 2024-04-06 ENCOUNTER — Encounter (HOSPITAL_COMMUNITY): Payer: Self-pay | Admitting: Urology

## 2024-04-06 DIAGNOSIS — Z87442 Personal history of urinary calculi: Secondary | ICD-10-CM | POA: Diagnosis not present

## 2024-04-06 DIAGNOSIS — N10 Acute tubulo-interstitial nephritis: Secondary | ICD-10-CM | POA: Diagnosis not present

## 2024-04-06 DIAGNOSIS — Z87891 Personal history of nicotine dependence: Secondary | ICD-10-CM | POA: Diagnosis not present

## 2024-04-06 DIAGNOSIS — Z90710 Acquired absence of both cervix and uterus: Secondary | ICD-10-CM | POA: Diagnosis not present

## 2024-04-06 DIAGNOSIS — N202 Calculus of kidney with calculus of ureter: Secondary | ICD-10-CM | POA: Diagnosis not present

## 2024-04-06 DIAGNOSIS — E119 Type 2 diabetes mellitus without complications: Secondary | ICD-10-CM | POA: Diagnosis not present

## 2024-04-06 DIAGNOSIS — Z96651 Presence of right artificial knee joint: Secondary | ICD-10-CM | POA: Diagnosis not present

## 2024-04-06 DIAGNOSIS — N179 Acute kidney failure, unspecified: Secondary | ICD-10-CM | POA: Diagnosis not present

## 2024-04-06 DIAGNOSIS — Z79899 Other long term (current) drug therapy: Secondary | ICD-10-CM | POA: Diagnosis not present

## 2024-04-06 DIAGNOSIS — J449 Chronic obstructive pulmonary disease, unspecified: Secondary | ICD-10-CM | POA: Diagnosis not present

## 2024-04-06 DIAGNOSIS — Z7984 Long term (current) use of oral hypoglycemic drugs: Secondary | ICD-10-CM | POA: Diagnosis not present

## 2024-04-06 DIAGNOSIS — Z96643 Presence of artificial hip joint, bilateral: Secondary | ICD-10-CM | POA: Diagnosis not present

## 2024-04-06 DIAGNOSIS — I10 Essential (primary) hypertension: Secondary | ICD-10-CM | POA: Diagnosis not present

## 2024-04-06 LAB — BASIC METABOLIC PANEL WITH GFR
Anion gap: 15 (ref 5–15)
Anion gap: 11 (ref 5–15)
BUN: 56 mg/dL — ABNORMAL HIGH (ref 8–23)
BUN: 56 mg/dL — ABNORMAL HIGH (ref 8–23)
CO2: 23 mmol/L (ref 22–32)
CO2: 27 mmol/L (ref 22–32)
Calcium: 9.9 mg/dL (ref 8.9–10.3)
Calcium: 9.7 mg/dL (ref 8.9–10.3)
Chloride: 101 mmol/L (ref 98–111)
Chloride: 104 mmol/L (ref 98–111)
Creatinine, Ser: 2.83 mg/dL — ABNORMAL HIGH (ref 0.44–1.00)
Creatinine, Ser: 2.38 mg/dL — ABNORMAL HIGH (ref 0.44–1.00)
GFR, Estimated: 17 mL/min — ABNORMAL LOW (ref >60)
GFR, Estimated: 21 mL/min — ABNORMAL LOW (ref >60)
Glucose, Bld: 164 mg/dL — ABNORMAL HIGH (ref 70–99)
Glucose, Bld: 200 mg/dL — ABNORMAL HIGH (ref 70–99)
Potassium: 4 mmol/L (ref 3.5–5.1)
Potassium: 3.7 mmol/L (ref 3.5–5.1)
Sodium: 139 mmol/L (ref 135–145)
Sodium: 141 mmol/L (ref 135–145)

## 2024-04-06 LAB — CBC
HCT: 36.1 % (ref 36.0–46.0)
Hemoglobin: 11.2 g/dL — ABNORMAL LOW (ref 12.0–15.0)
MCH: 29.3 pg (ref 26.0–34.0)
MCHC: 31 g/dL (ref 30.0–36.0)
MCV: 94.5 fL (ref 80.0–100.0)
Platelets: 248 K/uL (ref 150–400)
RBC: 3.82 MIL/uL — ABNORMAL LOW (ref 3.87–5.11)
RDW: 13.7 % (ref 11.5–15.5)
WBC: 9.7 K/uL (ref 4.0–10.5)
nRBC: 0 % (ref 0.0–0.2)

## 2024-04-06 LAB — GLUCOSE, CAPILLARY
Glucose-Capillary: 149 mg/dL — ABNORMAL HIGH (ref 70–99)
Glucose-Capillary: 144 mg/dL — ABNORMAL HIGH (ref 70–99)
Glucose-Capillary: 151 mg/dL — ABNORMAL HIGH (ref 70–99)
Glucose-Capillary: 118 mg/dL — ABNORMAL HIGH (ref 70–99)

## 2024-04-06 LAB — HEMOGLOBIN A1C
Hgb A1c MFr Bld: 6.6 % — ABNORMAL HIGH (ref 4.8–5.6)
Mean Plasma Glucose: 143 mg/dL

## 2024-04-06 MED ORDER — ALLOPURINOL 100 MG PO TABS: 100.0000 mg | ORAL_TABLET | Freq: Every evening | ORAL | Status: DC

## 2024-04-06 MED ORDER — ALLOPURINOL 100 MG PO TABS
200.0000 mg | ORAL_TABLET | Freq: Every evening | ORAL | Status: DC
  Administered 2024-04-06: 200 mg via ORAL
  Filled 2024-04-06: qty 2

## 2024-04-06 MED ORDER — SODIUM CHLORIDE 0.9 % IV SOLN: INTRAVENOUS | Status: DC

## 2024-04-06 NOTE — Plan of Care (Incomplete)
  Problem: Education: Goal: Knowledge of General Education information will improve Description: Including pain rating scale, medication(s)/side effects and non-pharmacologic comfort measures Outcome: Adequate for Discharge   Problem: Health Behavior/Discharge Planning: Goal: Ability to manage health-related needs will improve Outcome: Adequate for Discharge   Problem: Clinical Measurements: Goal: Ability to maintain clinical measurements within normal limits will improve Outcome: Adequate for Discharge Goal: Will remain free from infection Outcome: Adequate for Discharge Goal: Diagnostic test results will improve Outcome: Adequate for Discharge Goal: Respiratory complications will improve Outcome: Adequate for Discharge Goal: Cardiovascular complication will be avoided Outcome: Adequate for Discharge   Problem: Activity: Goal: Risk for activity intolerance will decrease Outcome: Adequate for Discharge   Problem: Coping: Goal: Level of anxiety will decrease Outcome: Adequate for Discharge   Problem: Elimination: Goal: Will not experience complications related to bowel motility Outcome: Adequate for Discharge Goal: Will not experience complications related to urinary retention Outcome: Adequate for Discharge

## 2024-04-06 NOTE — Care Management Obs Status (Signed)
 MEDICARE OBSERVATION STATUS NOTIFICATION   Patient Details  Name: Gwendolyn Snyder MRN: [id number] Date of Birth: 12/01/1950   Medicare Observation Status Notification Given:  Yes    Tawni CHRISTELLA Eva, LCSW 04/06/2024, 4:20 PM

## 2024-04-06 NOTE — Progress Notes (Signed)
 1 Day Post-Op Subjective: Doing well, no pain. Pt had diarrhea several time but she states that this is baseline for her. Cr has increased to 2.83  Objective: Vital signs in last 24 hours: Temp:  [97 F (36.1 C)-99.1 F (37.3 C)] 97.5 F (36.4 C) (11/25 0413) Pulse Rate:  [69-87] 69 (11/25 0413) Resp:  [18-27] 18 (11/25 0413) BP: (94-149)/(55-75) 145/71 (11/25 0413) SpO2:  [92 %-96 %] 96 % (11/25 0413) Weight:  [76.2 kg] 76.2 kg (11/24 1252)  Intake/Output from previous day: 11/24 0701 - 11/25 0700 In: 251.8 [I.V.:51.8; IV Piggyback:200] Out: -  Intake/Output this shift: No intake/output data recorded.  Physical Exam:  General: Alert and oriented CV: RRR Lungs: Clear Abdomen: Soft, ND, ATTP;  GU: no foley   Lab Results: Recent Labs    04/06/24 0406  HGB 11.2*  HCT 36.1   BMET Recent Labs    04/06/24 0406  NA 139  K 4.0  CL 101  CO2 23  GLUCOSE 164*  BUN 56*  CREATININE 2.83*  CALCIUM  9.9     Studies/Results: DG C-Arm 1-60 Min-No Report Result Date: 04/05/2024 Fluoroscopy was utilized by the requesting physician.  No radiographic interpretation.    Assessment/Plan: # R ureteral stone - s/p stent  - will schedule for outpatient URS sent Dr. Watt message   # Concern for obstructive pyelo  - continue stent  - Ucx pending  - continue CTX  # AKI - cr increased to 2.8  - start IVF - will continue to observe  - if no improvement tomorrow will consult nephrology    LOS: 1 day   Jackey Pea MD 04/06/2024, 11:10 AM [facility]

## 2024-04-07 LAB — BASIC METABOLIC PANEL WITH GFR
Anion gap: 12 (ref 5–15)
BUN: 51 mg/dL — ABNORMAL HIGH (ref 8–23)
CO2: 23 mmol/L (ref 22–32)
Calcium: 9.1 mg/dL (ref 8.9–10.3)
Chloride: 107 mmol/L (ref 98–111)
Creatinine, Ser: 1.93 mg/dL — ABNORMAL HIGH (ref 0.44–1.00)
GFR, Estimated: 27 mL/min — ABNORMAL LOW (ref >60)
Glucose, Bld: 115 mg/dL — ABNORMAL HIGH (ref 70–99)
Potassium: 4.2 mmol/L (ref 3.5–5.1)
Sodium: 141 mmol/L (ref 135–145)

## 2024-04-07 LAB — CBC
HCT: 33.6 % — ABNORMAL LOW (ref 36.0–46.0)
Hemoglobin: 10.5 g/dL — ABNORMAL LOW (ref 12.0–15.0)
MCH: 29.7 pg (ref 26.0–34.0)
MCHC: 31.3 g/dL (ref 30.0–36.0)
MCV: 94.9 fL (ref 80.0–100.0)
Platelets: 311 K/uL (ref 150–400)
RBC: 3.54 MIL/uL — ABNORMAL LOW (ref 3.87–5.11)
RDW: 13.5 % (ref 11.5–15.5)
WBC: 12.8 K/uL — ABNORMAL HIGH (ref 4.0–10.5)
nRBC: 0 % (ref 0.0–0.2)

## 2024-04-07 LAB — GLUCOSE, CAPILLARY: Glucose-Capillary: 94 mg/dL (ref 70–99)

## 2024-04-07 MED ORDER — CEFDINIR 300 MG PO CAPS: 300.0000 mg | ORAL_CAPSULE | Freq: Two times a day (BID) | ORAL | 0 refills | Status: AC

## 2024-04-07 MED ORDER — ALFUZOSIN HCL ER 10 MG PO TB24: 10.0000 mg | ORAL_TABLET | Freq: Every day | ORAL | 0 refills | Status: AC

## 2024-04-07 MED ORDER — HYOSCYAMINE SULFATE 0.125 MG PO TBDP: 0.1250 mg | ORAL_TABLET | Freq: Four times a day (QID) | ORAL | 0 refills | Status: AC | PRN

## 2024-04-07 MED ORDER — METHOCARBAMOL 750 MG PO TABS: 750.0000 mg | ORAL_TABLET | Freq: Four times a day (QID) | ORAL | 0 refills | Status: AC

## 2024-04-07 NOTE — Progress Notes (Signed)
 Patient received discharge orders to go home. Patient was given discharge paperwork/instructions. Another RN went over discharge instructions/paperwork with the patient. Any questions/concerns were addressed/answered during this time. Patient left the hospital stable, had discharge instructions/paperwork, and had all personal belongings.

## 2024-04-07 NOTE — Progress Notes (Signed)
   04/06/24 1633  TOC Brief Assessment  Insurance and Status Reviewed  Patient has primary care physician Yes  Home environment has been reviewed Home with brother  Prior level of function: Mod independent  Prior/Current Home Services No current home services  Social Drivers of Health Review SDOH reviewed no interventions necessary  Readmission risk has been reviewed Yes  Transition of care needs no transition of care needs at this time

## 2024-04-07 NOTE — Discharge Instructions (Addendum)
 DISCHARGE INSTRUCTIONS FOR KIDNEY STONE/ESWL MEDICATIONS:  1.  Tamsulosin  2. Hyoscyamine  3. Methocarbamol   4. Alfuzosin  (if this causes nausea or vomiting this medication can be stopped)   ACTIVITY:  1. No strenuous activity x 1week  2. No driving while on narcotic pain medications  3. Drink plenty of water  4. Continue to walk at home - it is normal to see blood in the urine while the stent is in place, so keep active, but don't over do it.  5. May return to work/school tomorrow or when you feel ready  6. You may experience some pain when urinating in the kidney on the side that was operated on while the stent is in place this is normal  BATHING:  1. You can shower and we recommend daily showers  2. You have a string coming from your urethra: The stent string is attached to your ureteral stent. Do not pull on this.   SIGNS/SYMPTOMS TO CALL:  Please call us  if you have a fever greater than 101.5, uncontrolled nausea/vomiting, uncontrolled pain, dizziness, unable to urinate, bloody urine with clots greater than the size of a quarter, chest pain, shortness of breath, leg swelling, leg pain, redness around wound, drainage from wound, or any other concerns or questions.   You can reach us  at [phone].   FOLLOW-UP:  1. You will be set up for ureteroscopy (stone removal) with Dr. Shane or Dr. Watt you will be called by the [facility].

## 2024-04-07 NOTE — Discharge Summary (Signed)
 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 Briefly, Gwendolyn Snyder is a 73 y.o. year old patient with R proximal ureteral stone, she was stented and purlent mateial drained so she was admitted for observation with concerns for infection.Gwendolyn Snyder   Hospital Course: Patient tolerated the procedure well.  She was then transferred to the floor after an uneventful PACU stay.  Her hospital course was uncomplicated.  On POD#2 she had met discharge criteria: was eating a regular diet, was up and ambulating independently,  pain was well controlled, was voiding without a catheter, and was ready to for discharge. He Cr had decreased to base line. She was discharged home.   Physical exam: Gen: NAD Resp: normal WOB on RA Cards: RRR per monitor  GI: soft non tender.    Laboratory values:  Recent Labs    04/06/24 0406 04/07/24 0424  WBC 9.7 12.8*  HGB 11.2* 10.5*  HCT 36.1 33.6*   Recent Labs    04/06/24 0406 04/06/24 1451 04/07/24 0424  NA 139 141 141  K 4.0 3.7 4.2  CL 101 104 107  CO2 23 27 23   GLUCOSE 164* 200* 115*  BUN 56* 56* 51*  CREATININE 2.83* 2.38* 1.93*  CALCIUM   9.9 9.7 9.1   No results for input(s): LABPT, INR in the last 72 hours. No results for input(s): LABURIN in the last 72 hours. Results for orders placed or performed during the hospital encounter of 04/05/24  Aerobic/Anaerobic Culture w Gram Stain (surgical/deep wound)     Status: None (Preliminary result)   Collection Time: 04/05/24  4:16 PM   Specimen: Path fluid; Body Fluid  Result Value Ref Range Status   Specimen Description URINE, CATHETERIZED  Final   Special Requests RIGHT RENAL PELVIC  Final   Gram Stain   Final    RARE WBC PRESENT, PREDOMINANTLY MONONUCLEAR RARE GRAM NEGATIVE RODS    Culture   Final    NO GROWTH < 12 HOURS Performed at [facility], [address], [place], [place] [address]    Report Status PENDING  Incomplete    Disposition: Home  Discharge instruction: The patient was instructed to be ambulatory but told to refrain from heavy lifting, strenuous activity, or driving.   She will be called to set up f/u for URS   Discharge medications:  Allergies as of 04/07/2024       Reactions   Lisinopril  Swelling, Other (See Comments)   SEVERE FACIAL SWELLING!!   Other Shortness Of Breath, Other (See Comments)   Unnamed eye drop = Trouble breathing   Tape Other (See Comments)   SKIN IS VERY THIN- TEARS AND BRUISES EASILY!!   Aspirin  Palpitations, Other (See Comments)   Makes ears ring and heart beat fast and heard in the ears  Codeine Other (See Comments)   Hard to wake up   Flomax  [tamsulosin  Hcl] Nausea And Vomiting   Pravastatin  Other (See Comments)   Muscle pain        Medication List     TAKE these medications    acetaminophen  650 MG CR tablet Commonly known as: TYLENOL  Take 1,300 mg by mouth in the morning and at bedtime.   alfuzosin  10 MG 24 hr tablet Commonly known as: UROXATRAL  Take 1 tablet (10 mg total) by mouth daily with breakfast for 30 doses.   allopurinol  300 MG tablet Commonly known as: ZYLOPRIM  Take 1  tablet (300 mg total) by mouth every evening. What changed: when to take this   amLODipine  10 MG tablet Commonly known as: NORVASC  Take 5 mg by mouth See admin instructions. Take 5 mg (0.5 tablet) by mouth in the morning and HOLD FOR SWELLING or EDEMA   carvedilol  6.25 MG tablet Commonly known as: COREG  Take 6.25 mg by mouth See admin instructions. Take 6.25 mg by mouth in the morning and evening   cefdinir  300 MG capsule Commonly known as: OMNICEF  Take 1 capsule (300 mg total) by mouth 2 (two) times daily for 20 doses.   Co Q 10 100 MG Caps Take 100 mg by mouth daily.   cyanocobalamin 1000 MCG/ML injection Commonly known as: VITAMIN B12 Inject 1,000 mcg into the skin every 28 (twenty-eight) days.   ezetimibe  10 MG tablet Commonly known as: ZETIA  Take 10 mg by mouth daily.   FLAXSEED OIL PO Take 750 mg by mouth daily.   FREESTYLE LITE test strip Generic drug: glucose blood Use to test blood sugar once daily   hyoscyamine  0.125 MG Tbdp disintergrating tablet Commonly known as: ANASPAZ  Place 1 tablet (0.125 mg total) under the tongue every 6 (six) hours as needed for up to 20 doses.   Inositol Niacinate 500 MG Caps Take 500 mg by mouth in the morning and at bedtime.   latanoprost  0.005 % ophthalmic solution Commonly known as: XALATAN  Place 1 drop into the left eye at bedtime.   losartan 25 MG tablet Commonly known as: COZAAR Take 25 mg by mouth daily.   methocarbamol  750 MG tablet Commonly known as: ROBAXIN  Take 1 tablet (750 mg total) by mouth 4 (four) times daily for 20 doses.   Milk Thistle 1000 MG Caps Take 1,000 mg by mouth in the morning.   ondansetron  4 MG disintegrating tablet Commonly known as: ZOFRAN -ODT Take 1 tablet (4 mg total) by mouth every 8 (eight) hours as needed for nausea or vomiting. What changed: reasons to take this   ondansetron  4 MG tablet Commonly known as: ZOFRAN  Take 1 tablet (4 mg total) by mouth every 6 (six) hours as needed  for nausea.   oxyCODONE  5 MG immediate release tablet Commonly known as: Roxicodone  Take 1 tablet (5 mg total) by mouth every 6 (six) hours as needed for up to 12 doses for severe pain (pain score 7-10).   pantoprazole  40 MG tablet Commonly known as: PROTONIX  TAKE ONE TABLET BY MOUTH ONCE DAILY   Potassium Citrate  15 MEQ (1620 MG) Tbcr Take 1 tablet by mouth in the morning and at bedtime.   predniSONE  10 MG tablet Commonly known as: DELTASONE  Take 2 tablets (20 mg total) by mouth 2 (two) times daily.   Refresh Tears 0.5 % Soln Generic drug: carboxymethylcellulose Place 1 drop into the right eye in the morning and at bedtime.   rosuvastatin  5 MG tablet Commonly  known as: CRESTOR  Take 5 mg by mouth at bedtime.   sitaGLIPtin 100 MG tablet Commonly known as: JANUVIA Take 50 mg by mouth in the morning.   timolol 0.5 % ophthalmic solution Commonly known as: BETIMOL Place 1 drop into the left eye in the morning.   Vitamin D3 1000 units Caps Take 1,000 Units by mouth daily.        Followup:   Follow-up Information     Shane Steffan BROCKS, MD Follow up.   Specialty: Urology Why: you will be called by [facility] to set up follow up for surgery to remove the stone. Contact information: [address] [place] [address] [phone]

## 2024-04-10 LAB — AEROBIC/ANAEROBIC CULTURE W GRAM STAIN (SURGICAL/DEEP WOUND): Culture: NO GROWTH

## 2024-04-12 DIAGNOSIS — B351 Tinea unguium: Secondary | ICD-10-CM | POA: Diagnosis not present

## 2024-04-12 DIAGNOSIS — E1142 Type 2 diabetes mellitus with diabetic polyneuropathy: Secondary | ICD-10-CM | POA: Diagnosis not present

## 2024-04-13 ENCOUNTER — Inpatient Hospital Stay: Admission: RE | Admit: 2024-04-13 | Source: Ambulatory Visit

## 2024-04-13 DIAGNOSIS — N201 Calculus of ureter: Secondary | ICD-10-CM | POA: Diagnosis not present

## 2024-04-13 DIAGNOSIS — Z87440 Personal history of urinary (tract) infections: Secondary | ICD-10-CM | POA: Diagnosis not present

## 2024-04-13 DIAGNOSIS — B359 Dermatophytosis, unspecified: Secondary | ICD-10-CM | POA: Diagnosis not present

## 2024-04-14 ENCOUNTER — Other Ambulatory Visit: Payer: Self-pay | Admitting: Urology

## 2024-04-21 DIAGNOSIS — N201 Calculus of ureter: Secondary | ICD-10-CM | POA: Diagnosis not present

## 2024-04-26 NOTE — Patient Instructions (Signed)
 SURGICAL WAITING ROOM VISITATION  Patients having surgery or a procedure may have no more than 2 support people in the waiting area - these visitors may rotate.    Children ages 74 and under will not be able to visit patients in [facility] under most circumstances.   Visitors with respiratory illnesses are discouraged from visiting and should remain at home.  If the patient needs to stay at the hospital during part of their recovery, the visitor guidelines for inpatient rooms apply. Pre-op nurse will coordinate an appropriate time for 1 support person to accompany patient in pre-op.  This support person may not rotate.    Please refer to the [facility] website for the visitor guidelines for Inpatients (after your surgery is over and you are in a regular room).       Your procedure is scheduled on: 05/18/24   Report to [facility] Main Entrance    Report to admitting at 5:15 AM   Call this number if you have problems the morning of surgery [phone]   Do not eat food or drink liquids :After Midnight.but may have sips of water to take meds.    Oral Hygiene is also important to reduce your risk of infection.                                    Remember - BRUSH YOUR TEETH THE MORNING OF SURGERY WITH YOUR REGULAR TOOTHPASTE  DENTURES WILL BE REMOVED PRIOR TO SURGERY PLEASE DO NOT APPLY Poly grip OR ADHESIVES!!!     Stop all vitamins and herbal supplements 7 days before surgery.   Take these medicines the morning of surgery with A SIP OF WATER: tylenol  if needed, amlodipine , carvedilol , ezetimibe , oxycodone  if needed, rosuvastatin , eye drops.  DO NOT TAKE ANY ORAL DIABETIC MEDICATIONS DAY OF YOUR SURGERY Hold Januvia the morning of surgery.              You may not have any metal on your body including hair pins, jewelry, and body piercing             Do not wear make-up, lotions, powders, perfumes/cologne, or deodorant  Do not wear nail polish including  gel and S&S, artificial/acrylic nails, or any other type of covering on natural nails including finger and toenails. If you have artificial nails, gel coating, etc. that needs to be removed by a nail salon please have this removed prior to surgery or surgery may need to be canceled/ delayed if the surgeon/ anesthesia feels like they are unable to be safely monitored.   Do not shave  48 hours prior to surgery.           Do not bring valuables to the hospital. [facility] IS NOT             RESPONSIBLE   FOR VALUABLES.   Contacts, glasses, dentures or bridgework may not be worn into surgery.  DO NOT BRING YOUR HOME MEDICATIONS TO THE HOSPITAL. PHARMACY WILL DISPENSE MEDICATIONS LISTED ON YOUR MEDICATION LIST TO YOU DURING YOUR ADMISSION IN THE HOSPITAL!    Patients discharged on the day of surgery will not be allowed to drive home.  Someone NEEDS to stay with you for the first 24 hours after anesthesia.   Special Instructions: Bring a copy of your healthcare power of attorney and living will documents the day of surgery if you  haven't scanned them before.              Please read over the following fact sheets you were given: IF YOU HAVE QUESTIONS ABOUT YOUR PRE-OP INSTRUCTIONS PLEASE CALL [phone] [place]   If you received a COVID test during your pre-op visit  it is requested that you wear a mask when out in public, stay away from anyone that may not be feeling well and notify your surgeon if you develop symptoms. If you test positive for Covid or have been in contact with anyone that has tested positive in the last 10 days please notify you surgeon.     - Preparing for Surgery Before surgery, you can play an important role.  Because skin is not sterile, your skin needs to be as free of germs as possible.  You can reduce the number of germs on your skin by washing with CHG (chlorahexidine gluconate) soap before surgery.  CHG is an antiseptic cleaner which kills germs and bonds  with the skin to continue killing germs even after washing. Please DO NOT use if you have an allergy to CHG or antibacterial soaps.  If your skin becomes reddened/irritated stop using the CHG and inform your nurse when you arrive at Short Stay. Do not shave (including legs and underarms) for at least 48 hours prior to the first CHG shower.  You may shave your face/neck.  Please follow these instructions carefully:  1.  Shower with CHG Soap the night before surgery and the morning of surgery.  2.  If you choose to wash your hair, wash your hair first as usual with your normal  shampoo.  3.  After you shampoo, rinse your hair and body thoroughly to remove the shampoo.                             4.  Use CHG as you would any other liquid soap.  You can apply chg directly to the skin and wash.  Gently with a scrungie or clean washcloth.  5.  Apply the CHG Soap to your body ONLY FROM THE NECK DOWN.   Do   not use on face/ open                           Wound or open sores. Avoid contact with eyes, ears mouth and   genitals (private parts).                       Wash face,  Genitals (private parts) with your normal soap.             6.  Wash thoroughly, paying special attention to the area where your    surgery  will be performed.  7.  Thoroughly rinse your body with warm water from the neck down.  8.  DO NOT shower/wash with your normal soap after using and rinsing off the CHG Soap.                9.  Pat yourself dry with a clean towel.            10.  Wear clean pajamas.            11.  Place clean sheets on your bed the night of your first shower and do not  sleep with pets. Day of Surgery : Do not  apply any CHG, lotions/deodorants the morning of surgery.  Please wear clean clothes to the hospital/surgery center.  FAILURE TO FOLLOW THESE INSTRUCTIONS MAY RESULT IN THE CANCELLATION OF YOUR SURGERY  PATIENT SIGNATURE_________________________________  NURSE  SIGNATURE__________________________________  ________________________________________________________________________How to Manage Your Diabetes Before and After Surgery  Why is it important to control my blood sugar before and after surgery? Improving blood sugar levels before and after surgery helps healing and can limit problems. A way of improving blood sugar control is eating a healthy diet by:  Eating less sugar and carbohydrates  Increasing activity/exercise  Talking with your doctor about reaching your blood sugar goals High blood sugars (greater than 180 mg/dL) can raise your risk of infections and slow your recovery, so you will need to focus on controlling your diabetes during the weeks before surgery. Make sure that the doctor who takes care of your diabetes knows about your planned surgery including the date and location.  How do I manage my blood sugar before surgery? Check your blood sugar at least 4 times a day, starting 2 days before surgery, to make sure that the level is not too high or low. Check your blood sugar the morning of your surgery when you wake up and every 2 hours until you get to the Short Stay unit. If your blood sugar is less than 70 mg/dL, you will need to treat for low blood sugar: Do not take insulin . Treat a low blood sugar (less than 70 mg/dL) with  cup of clear juice (cranberry or apple), 4 glucose tablets, OR glucose gel. Recheck blood sugar in 15 minutes after treatment (to make sure it is greater than 70 mg/dL). If your blood sugar is not greater than 70 mg/dL on recheck, call [phone] for further instructions. Report your blood sugar to the short stay nurse when you get to Short Stay.  If you are admitted to the hospital after surgery: Your blood sugar will be checked by the staff and you will probably be given insulin  after surgery (instead of oral diabetes medicines) to make sure you have good blood sugar levels. The goal for blood sugar  control after surgery is 80-180 mg/dL.   WHAT DO I DO ABOUT MY DIABETES MEDICATION?  Do not take oral diabetes medicines (pills) the morning of surgery. Hold Januvia the morning of surgery.  DO NOT TAKE THE FOLLOWING 7 DAYS PRIOR TO SURGERY: Ozempic, Wegovy, Rybelsus (Semaglutide), Byetta (exenatide), Bydureon (exenatide ER), Victoza, Saxenda (liraglutide), or Trulicity (dulaglutide) Mounjaro (Tirzepatide) Adlyxin (Lixisenatide), Polyethylene Glycol Loxenatide.  Patient Signature:  Date:   Nurse Signature:  Date:   Reviewed and Endorsed by [facility] Patient Education Committee, August 2015

## 2024-04-26 NOTE — Progress Notes (Signed)
 COVID Vaccine received:  []  No [x]  Yes Date of any COVID positive Test in last 90 days: none PCP - Norleen General MD Cardiologist - n/a  Chest x-ray -  EKG -   Stress Test - 08/11/19 Epic ECHO - 10/18/15 Epic Cardiac Cath -   Bowel Prep - [x]  No  []   Yes ______  Pacemaker / ICD device [x]  No []  Yes   Spinal Cord Stimulator:[x]  No []  Yes       History of Sleep Apnea? [x]  No []  Yes   CPAP used?- [x]  No []  Yes    Does the patient monitor blood sugar?          []  No [x]  Yes  []  N/A  Patient has: []  NO Hx DM   []  Pre-DM                 []  DM1  [x]   DM2 Does patient have a [employer] or Dexacom? [x]  No []  Yes   Fasting Blood Sugar Ranges- 120's Checks Blood Sugar ____1_ time a week  GLP1 agonist / usual dose - no GLP1 instructions:  SGLT-2 inhibitors / usual dose - no SGLT-2 instructions:   Blood Thinner / Instructions:no Aspirin  Instructions:no  Comments:   Activity level: Patient is unable to climb a flight of stairs without difficulty; [x]  No CP  []  No SOB, but would have SOB, Arthritis___   Patient can  perform ADLs without assistance.   Anesthesia review:   Patient denies shortness of breath, fever, cough and chest pain at PAT appointment.  Patient verbalized understanding and agreement to the Pre-Surgical Instructions that were given to them at this PAT appointment. Patient was also educated of the need to review these PAT instructions again prior to his/her surgery.I reviewed the appropriate phone numbers to call if they have any and questions or concerns.

## 2024-04-27 ENCOUNTER — Other Ambulatory Visit: Payer: Self-pay

## 2024-04-27 ENCOUNTER — Encounter (HOSPITAL_COMMUNITY)
Admission: RE | Admit: 2024-04-27 | Discharge: 2024-04-27 | Disposition: A | Source: Ambulatory Visit | Attending: Urology | Admitting: Urology

## 2024-04-27 ENCOUNTER — Encounter (HOSPITAL_COMMUNITY): Payer: Self-pay

## 2024-04-27 VITALS — BP 150/77 | HR 60 | Resp 18 | Ht 63.0 in | Wt 168.0 lb

## 2024-04-27 DIAGNOSIS — Z01818 Encounter for other preprocedural examination: Secondary | ICD-10-CM | POA: Insufficient documentation

## 2024-04-27 DIAGNOSIS — R9431 Abnormal electrocardiogram [ECG] [EKG]: Secondary | ICD-10-CM | POA: Insufficient documentation

## 2024-04-27 DIAGNOSIS — E119 Type 2 diabetes mellitus without complications: Secondary | ICD-10-CM

## 2024-04-27 DIAGNOSIS — I10 Essential (primary) hypertension: Secondary | ICD-10-CM | POA: Insufficient documentation

## 2024-04-27 LAB — BASIC METABOLIC PANEL WITH GFR
Anion gap: 8 (ref 5–15)
BUN: 23 mg/dL (ref 8–23)
CO2: 28 mmol/L (ref 22–32)
Calcium: 9.8 mg/dL (ref 8.9–10.3)
Chloride: 107 mmol/L (ref 98–111)
Creatinine, Ser: 1.37 mg/dL — ABNORMAL HIGH (ref 0.44–1.00)
GFR, Estimated: 41 mL/min — ABNORMAL LOW (ref >60)
Glucose, Bld: 110 mg/dL — ABNORMAL HIGH (ref 70–99)
Potassium: 4.6 mmol/L (ref 3.5–5.1)
Sodium: 143 mmol/L (ref 135–145)

## 2024-04-27 LAB — CBC
HCT: 37.2 % (ref 36.0–46.0)
Hemoglobin: 11.2 g/dL — ABNORMAL LOW (ref 12.0–15.0)
MCH: 28.5 pg (ref 26.0–34.0)
MCHC: 30.1 g/dL (ref 30.0–36.0)
MCV: 94.7 fL (ref 80.0–100.0)
Platelets: 217 K/uL (ref 150–400)
RBC: 3.93 MIL/uL (ref 3.87–5.11)
RDW: 14.1 % (ref 11.5–15.5)
WBC: 8.8 K/uL (ref 4.0–10.5)
nRBC: 0 % (ref 0.0–0.2)

## 2024-04-27 LAB — GLUCOSE, CAPILLARY: Glucose-Capillary: 107 mg/dL — ABNORMAL HIGH (ref 70–99)

## 2024-05-17 NOTE — Anesthesia Preprocedure Evaluation (Addendum)
 "                                  Anesthesia Evaluation  Patient identified by MRN, date of birth, ID band Patient awake    Reviewed: Allergy & Precautions, NPO status , Patient's Chart, lab work & pertinent test results  History of Anesthesia Complications Negative for: history of anesthetic complications  Airway Mallampati: II  TM Distance: >3 FB Neck ROM: Full    Dental no notable dental hx. (+) Teeth Intact, Dental Advisory Given, Partial Lower,    Pulmonary COPD, former smoker   Pulmonary exam normal breath sounds clear to auscultation       Cardiovascular hypertension, Pt. on medications (-) angina (-) Past MI Normal cardiovascular exam Rhythm:Regular Rate:Normal  Stress Test (2021):  The left ventricular ejection fraction is hyperdynamic (>65%).  Nuclear stress EF: 69%.  There was no ST segment deviation noted during stress.  No ischemia or infarction noted on perfusion imaging.  This is a low risk study.    Neuro/Psych  PSYCHIATRIC DISORDERS Anxiety        GI/Hepatic ,GERD  Medicated and Controlled,,  Endo/Other  diabetes, Well Controlled, Type 2, Oral Hypoglycemic Agents    Renal/GU Renal InsufficiencyRenal disease (Baseline Cr ~1.9)R ureteral stone Lab Results      Component                Value               Date                                 K                        4.6                 04/27/2024                    CREATININE               1.37 (H)            04/27/2024                     Musculoskeletal  (+) Arthritis ,    Abdominal   Peds  Hematology  (+) Blood dyscrasia, anemia Lab Results      Component                Value               Date                      WBC                      8.8                 04/27/2024                HGB                      11.2 (L)            04/27/2024                HCT  37.2                04/27/2024                MCV                      94.7                 04/27/2024                PLT                      217                 04/27/2024               Anesthesia Other Findings All: see list  Reproductive/Obstetrics                              Anesthesia Physical Anesthesia Plan  ASA: 3  Anesthesia Plan: General   Post-op Pain Management:    Induction: Intravenous  PONV Risk Score and Plan: 3 and Ondansetron , Dexamethasone , Treatment may vary due to age or medical condition, Midazolam , Propofol  infusion and TIVA  Airway Management Planned: LMA  Additional Equipment: None  Intra-op Plan:   Post-operative Plan:   Informed Consent: I have reviewed the patients History and Physical, chart, labs and discussed the procedure including the risks, benefits and alternatives for the proposed anesthesia with the patient or authorized representative who has indicated his/her understanding and acceptance.     Dental advisory given  Plan Discussed with: CRNA  Anesthesia Plan Comments: (LMA TIVA)         Anesthesia Quick Evaluation  "

## 2024-05-18 ENCOUNTER — Encounter (HOSPITAL_COMMUNITY): Payer: Self-pay | Admitting: Urology

## 2024-05-18 ENCOUNTER — Ambulatory Visit (HOSPITAL_COMMUNITY)
Admission: RE | Admit: 2024-05-18 | Discharge: 2024-05-18 | Disposition: A | Discharge status: Home / Self Care | Source: Ambulatory Visit | Attending: Urology | Admitting: Urology

## 2024-05-18 ENCOUNTER — Ambulatory Visit (HOSPITAL_COMMUNITY)

## 2024-05-18 ENCOUNTER — Encounter (HOSPITAL_COMMUNITY)
Admission: RE | Disposition: A | Discharge status: Home / Self Care | Payer: Self-pay | Source: Ambulatory Visit | Attending: Urology

## 2024-05-18 ENCOUNTER — Ambulatory Visit (HOSPITAL_COMMUNITY): Admitting: Anesthesiology

## 2024-05-18 ENCOUNTER — Ambulatory Visit (HOSPITAL_COMMUNITY): Payer: Self-pay | Admitting: Medical

## 2024-05-18 DIAGNOSIS — Z87891 Personal history of nicotine dependence: Secondary | ICD-10-CM | POA: Insufficient documentation

## 2024-05-18 DIAGNOSIS — K219 Gastro-esophageal reflux disease without esophagitis: Secondary | ICD-10-CM | POA: Insufficient documentation

## 2024-05-18 DIAGNOSIS — N202 Calculus of kidney with calculus of ureter: Secondary | ICD-10-CM | POA: Diagnosis not present

## 2024-05-18 DIAGNOSIS — I10 Essential (primary) hypertension: Secondary | ICD-10-CM | POA: Insufficient documentation

## 2024-05-18 DIAGNOSIS — E119 Type 2 diabetes mellitus without complications: Secondary | ICD-10-CM | POA: Diagnosis not present

## 2024-05-18 DIAGNOSIS — N201 Calculus of ureter: Secondary | ICD-10-CM | POA: Diagnosis present

## 2024-05-18 DIAGNOSIS — J449 Chronic obstructive pulmonary disease, unspecified: Secondary | ICD-10-CM

## 2024-05-18 DIAGNOSIS — Z7984 Long term (current) use of oral hypoglycemic drugs: Secondary | ICD-10-CM | POA: Diagnosis not present

## 2024-05-18 HISTORY — PX: CYSTOSCOPY/URETEROSCOPY/HOLMIUM LASER/STENT PLACEMENT: SHX6546

## 2024-05-18 LAB — GLUCOSE, CAPILLARY: Glucose-Capillary: 125 mg/dL — ABNORMAL HIGH (ref 70–99)

## 2024-05-18 SURGERY — CYSTOSCOPY/URETEROSCOPY/HOLMIUM LASER/STENT PLACEMENT
Anesthesia: General | Laterality: Right

## 2024-05-18 MED ORDER — MIDAZOLAM HCL 2 MG/2ML IJ SOLN
INTRAMUSCULAR | Status: AC
  Filled 2024-05-18: qty 2

## 2024-05-18 MED ORDER — CEFDINIR 300 MG PO CAPS: 300.0000 mg | ORAL_CAPSULE | Freq: Two times a day (BID) | ORAL | 0 refills | Status: AC

## 2024-05-18 MED ORDER — LACTATED RINGERS IV SOLN: INTRAVENOUS | Status: DC

## 2024-05-18 MED ORDER — PROPOFOL 500 MG/50ML IV EMUL
INTRAVENOUS | Status: DC | PRN
  Administered 2024-05-18: 125 ug/kg/min via INTRAVENOUS

## 2024-05-18 MED ORDER — PROPOFOL 10 MG/ML IV BOLUS
INTRAVENOUS | Status: AC
  Filled 2024-05-18: qty 20

## 2024-05-18 MED ORDER — LIDOCAINE HCL (PF) 2 % IJ SOLN
INTRAMUSCULAR | Status: AC
  Filled 2024-05-18: qty 10

## 2024-05-18 MED ORDER — PROPOFOL 10 MG/ML IV BOLUS
INTRAVENOUS | Status: DC | PRN
  Administered 2024-05-18: 140 mg via INTRAVENOUS

## 2024-05-18 MED ORDER — ONDANSETRON HCL 4 MG/2ML IJ SOLN
INTRAMUSCULAR | Status: AC
  Filled 2024-05-18: qty 2

## 2024-05-18 MED ORDER — ONDANSETRON HCL 4 MG/2ML IJ SOLN: 4.0000 mg | Freq: Once | INTRAMUSCULAR | Status: DC | PRN

## 2024-05-18 MED ORDER — OXYCODONE HCL 5 MG PO TABS: 5.0000 mg | ORAL_TABLET | Freq: Four times a day (QID) | ORAL | 0 refills | Status: AC | PRN

## 2024-05-18 MED ORDER — EPHEDRINE SULFATE (PRESSORS) 25 MG/5ML IV SOSY
PREFILLED_SYRINGE | INTRAVENOUS | Status: DC | PRN
  Administered 2024-05-18: 10 mg via INTRAVENOUS

## 2024-05-18 MED ORDER — SODIUM CHLORIDE 0.9% FLUSH: 3.0000 mL | Freq: Two times a day (BID) | INTRAVENOUS | Status: DC

## 2024-05-18 MED ORDER — FENTANYL CITRATE (PF) 100 MCG/2ML IJ SOLN
INTRAMUSCULAR | Status: AC
  Filled 2024-05-18: qty 2

## 2024-05-18 MED ORDER — FENTANYL CITRATE (PF) 250 MCG/5ML IJ SOLN
INTRAMUSCULAR | Status: DC | PRN
  Administered 2024-05-18 (×2): 50 ug via INTRAVENOUS

## 2024-05-18 MED ORDER — ACETAMINOPHEN 10 MG/ML IV SOLN: 1000.0000 mg | Freq: Once | INTRAVENOUS | Status: DC | PRN

## 2024-05-18 MED ORDER — SODIUM CHLORIDE 0.9 % IR SOLN
Status: DC | PRN
  Administered 2024-05-18: 3000 mL

## 2024-05-18 MED ORDER — CHLORHEXIDINE GLUCONATE 0.12 % MT SOLN
15.0000 mL | Freq: Once | OROMUCOSAL | Status: AC
  Administered 2024-05-18: 15 mL via OROMUCOSAL

## 2024-05-18 MED ORDER — ONDANSETRON HCL 4 MG/2ML IJ SOLN
INTRAMUSCULAR | Status: DC | PRN
  Administered 2024-05-18: 4 mg via INTRAVENOUS

## 2024-05-18 MED ORDER — PHENYLEPHRINE HCL-NACL 20-0.9 MG/250ML-% IV SOLN
INTRAVENOUS | Status: AC
  Filled 2024-05-18: qty 500

## 2024-05-18 MED ORDER — FENTANYL CITRATE (PF) 50 MCG/ML IJ SOSY: 25.0000 ug | PREFILLED_SYRINGE | INTRAMUSCULAR | Status: DC | PRN

## 2024-05-18 MED ORDER — ORAL CARE MOUTH RINSE: 15.0000 mL | Freq: Once | OROMUCOSAL | Status: AC

## 2024-05-18 MED ORDER — INSULIN ASPART 100 UNIT/ML IJ SOLN: 0.0000 [IU] | INTRAMUSCULAR | Status: DC | PRN

## 2024-05-18 MED ORDER — DEXAMETHASONE SOD PHOSPHATE PF 10 MG/ML IJ SOLN
INTRAMUSCULAR | Status: DC | PRN
  Administered 2024-05-18: 10 mg via INTRAVENOUS

## 2024-05-18 MED ORDER — SODIUM CHLORIDE 0.9 % IV SOLN
2.0000 g | INTRAVENOUS | Status: AC
  Administered 2024-05-18: 2 g via INTRAVENOUS
  Filled 2024-05-18: qty 20

## 2024-05-18 MED ORDER — LIDOCAINE 2% (20 MG/ML) 5 ML SYRINGE
INTRAMUSCULAR | Status: DC | PRN
  Administered 2024-05-18: 80 mg via INTRAVENOUS

## 2024-05-18 SURGICAL SUPPLY — 19 items
BAG URO CATCHER STRL LF (MISCELLANEOUS) ×1 IMPLANT
BASKET STONE NCOMPASS (UROLOGICAL SUPPLIES) IMPLANT
CATH URETERAL DUAL LUMEN 10F (MISCELLANEOUS) IMPLANT
CATH URETL OPEN 5X70 (CATHETERS) IMPLANT
CLOTH BEACON ORANGE TIMEOUT ST (SAFETY) ×1 IMPLANT
EXTRACTOR STONE NITINOL NGAGE (UROLOGICAL SUPPLIES) IMPLANT
GLOVE SURG SS PI 8.0 STRL IVOR (GLOVE) ×1 IMPLANT
GOWN STRL SURGICAL XL XLNG (GOWN DISPOSABLE) ×1 IMPLANT
GUIDEWIRE STR DUAL SENSOR (WIRE) ×1 IMPLANT
KIT TURNOVER KIT A (KITS) ×1 IMPLANT
LASER FIB FLEXIVA PULSE ID 365 (Laser) IMPLANT
MANIFOLD NEPTUNE II (INSTRUMENTS) ×1 IMPLANT
PACK CYSTO (CUSTOM PROCEDURE TRAY) ×1 IMPLANT
SHEATH NAVIGATOR HD 11/13X36 (SHEATH) IMPLANT
SHEATH NAVIGATOR HD 12/14X28 (SHEATH) IMPLANT
STENT URET 6FRX24 CONTOUR (STENTS) IMPLANT
TRACTIP FLEXIVA PULS ID 200XHI (Laser) IMPLANT
TUBING CONNECTING 10 (TUBING) ×1 IMPLANT
TUBING UROLOGY SET (TUBING) ×1 IMPLANT

## 2024-05-18 NOTE — Anesthesia Procedure Notes (Signed)
 Procedure Name: LMA Insertion Date/Time: 05/18/2024 7:34 AM  Performed by: Nanci Riis, CRNAPre-anesthesia Checklist: Patient identified, Emergency Drugs available, Patient being monitored, Suction available and Timeout performed Patient Re-evaluated:Patient Re-evaluated prior to induction Oxygen Delivery Method: Circle system utilized Preoxygenation: Pre-oxygenation with 100% oxygen Induction Type: IV induction LMA: LMA inserted LMA Size: 4.0 Number of attempts: 1 Placement Confirmation: positive ETCO2 and breath sounds checked- equal and bilateral Tube secured with: Tape Dental Injury: Teeth and Oropharynx as per pre-operative assessment

## 2024-05-18 NOTE — OR Nursing (Signed)
Stone taken by Dr. [name] 

## 2024-05-18 NOTE — Op Note (Signed)
 Procedure: 1.  Cystoscopy with right ureteroscopy, laser application, stone extraction and stent exchange. 2.  Application of fluoroscopy.  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  Postop diagnosis: Same.  Surgeon: Dr. Norleen Seltzer.  Anesthesia: General.  Specimen: Stone fragments, given to family.  Drains: 6 French by 24 cm right Contour double-J stent with tether.  EBL: None.  Complications: None.    Indications: The patient is a 74 year old female with a history recurrent urolithiasis who underwent stenting in November for an 8 mm right UPJ stone with obstruction and infection.  She also has multiple renal stones.  She is elected ureteroscopy for management.  Procedure: She was taken the operating room where a general anesthetic was induced.  She was given 2 g of Rocephin .  She was placed in lithotomy position and fitted with PAS hose.  Her perineum and genitalia were prepped with Betadine solution and she was draped in usual sterile fashion.  Cystoscopy was performed using the 21 French scope and 30 degree lens.  Examination demonstrated a slightly encrusted stent at the right ureteral orifice with mild ureteral meatal edema but no other mucosal lesions.  She did have mild trabeculation.  The left ureteral orifice was unremarkable.  The stent was grasped with a grasping forceps and pulled to the urethral meatus.  An initial attempt to pass a wire through the stent was unsuccessful due to the encrustation.  I then remove the stent and replaced the cystoscope but was unable to initially get a wire up because of angulation of the ureter.  I then used the semirigid short ureteroscope to cannulate the ureteral orifice and passed the wire to the kidney under fluoroscopic guidance.  An assembled 12/14 x 28 cm digital access sheath was advanced over the wire without difficulty to the proximal ureter.  The inner core was removed and a second wire was passed to the kidney under  fluoroscopic guidance.  The sheath was removed and reassembled and inserted over the working wire.  The inner core was then removed leaving the sheath alongside the safety wire.  The dual-lumen digital ureteroscope was then passed and this was fitted with a 242 m track tip laser fiber with the holmium laser set on the dusting setting with 0.3 J and 60 Hz on the left pedal and 0.8 J and 10 Hz on the right pedal.  The bulk of the fragmentation was performed with the lobe power setting.  The UPJ stone had been dislodged in the renal pelvis and it was fragmented initially this was then followed by mid renal stones, upper renal stones and lower renal stones.  Once initial fragmentation had been performed, an engage basket was used to remove the larger fragments which tended to be approximately 2 to 3 mm in size and the remainder of the stones were fragmented into sand and dust.  A lot of which irrigated out during the procedure.  Once fluoroscopy and endoscopy demonstrated no residual large fragments and only small grit that was felt to be passable, the ureteroscope was removed.  The sheath was removed without difficulty.  A 6 French by 24 cm contour double-J stent with tether was advanced over the wire under fluoroscopic guidance and once the tip was in the kidney, the wire was removed.  The cystoscope was reinserted to confirm good positioning of the distal loop.  The cystoscope was removed and the stent string was then knotted close to the meatus, trimmed to an appropriate length and tucked  vaginally.  She was taken out of the lithotomy position, her anesthetic was reversed and she was moved to recovery in stable condition.  There were no complications.  The stones were given to the family.

## 2024-05-18 NOTE — Anesthesia Postprocedure Evaluation (Signed)
"   Anesthesia Post Note  Patient: Gwendolyn Snyder  Procedure(s) Performed: CYSTOSCOPY/URETEROSCOPY/HOLMIUM LASER/STENT PLACEMENT (Right)     Patient location during evaluation: PACU Anesthesia Type: General Level of consciousness: awake and alert Pain management: pain level controlled Vital Signs Assessment: post-procedure vital signs reviewed and stable Respiratory status: spontaneous breathing, nonlabored ventilation, respiratory function stable and patient connected to nasal cannula oxygen Cardiovascular status: blood pressure returned to baseline and stable Postop Assessment: no apparent nausea or vomiting Anesthetic complications: no   No notable events documented.  Last Vitals:  Vitals:   05/18/24 0915 05/18/24 0930  BP: (!) 158/71 (!) 162/76  Pulse: (!) 55 (!) 57  Resp: 17 18  Temp:  (!) 36.1 C  SpO2: 95% 95%    Last Pain:  Vitals:   05/18/24 0952  TempSrc:   PainSc: 0-No pain                 [name]      "

## 2024-05-18 NOTE — Interval H&P Note (Signed)
 History and Physical Interval Note: No change  05/18/2024 7:17 AM  Gwendolyn Snyder  has presented today for surgery, with the diagnosis of RIGHT URETERAL STONE.  The various methods of treatment have been discussed with the patient and family. After consideration of risks, benefits and other options for treatment, the patient has consented to  Procedures: CYSTOSCOPY/URETEROSCOPY/HOLMIUM LASER/STENT PLACEMENT (Right) as a surgical intervention.  The patient's history has been reviewed, patient examined, no change in status, stable for surgery.  I have reviewed the patient's chart and labs.  Questions were answered to the patient's satisfaction.     Chace Bisch

## 2024-05-18 NOTE — Discharge Instructions (Addendum)
 You may remove the stent on Thursday morning by pulling the attached string that is tucked vaginally.  Please try to spend some time each day with your right side up while lying in bed.  I got a lot of stone out but there is still some dust and grit that needs to be -passed.

## 2024-05-18 NOTE — Transfer of Care (Signed)
 Immediate Anesthesia Transfer of Care Note  Patient: Gwendolyn Snyder  Procedure(s) Performed: CYSTOSCOPY/URETEROSCOPY/HOLMIUM LASER/STENT PLACEMENT (Right)  Patient Location: PACU  Anesthesia Type:General  Level of Consciousness: drowsy and patient cooperative  Airway & Oxygen Therapy: Patient Spontanous Breathing  Post-op Assessment: Report given to RN and Post -op Vital signs reviewed and stable  Post vital signs: Reviewed and stable  Last Vitals:  Vitals Value Taken Time  BP 147/69 05/18/24 09:02  Temp    Pulse 61 05/18/24 09:05  Resp 20 05/18/24 09:05  SpO2 97 % 05/18/24 09:05  Vitals shown include unfiled device data.  Last Pain:  Vitals:   05/18/24 0557  TempSrc:   PainSc: 0-No pain         Complications: No notable events documented.

## 2024-05-19 ENCOUNTER — Encounter (HOSPITAL_COMMUNITY): Payer: Self-pay | Admitting: Urology
